# Patient Record
Sex: Female | Born: 1955 | Race: White | Hispanic: No | Marital: Married | Smoking: Never smoker
Health system: Southern US, Community
[De-identification: ages and names within clinical notes are randomized; demographics above are authoritative.]

## PROBLEM LIST (undated history)

## (undated) DIAGNOSIS — I10 Essential (primary) hypertension: Secondary | ICD-10-CM

## (undated) DIAGNOSIS — G4700 Insomnia, unspecified: Secondary | ICD-10-CM

## (undated) DIAGNOSIS — J342 Deviated nasal septum: Secondary | ICD-10-CM

## (undated) DIAGNOSIS — K635 Polyp of colon: Secondary | ICD-10-CM

## (undated) DIAGNOSIS — D509 Iron deficiency anemia, unspecified: Secondary | ICD-10-CM

## (undated) DIAGNOSIS — I4891 Unspecified atrial fibrillation: Secondary | ICD-10-CM

## (undated) DIAGNOSIS — E7800 Pure hypercholesterolemia, unspecified: Secondary | ICD-10-CM

## (undated) DIAGNOSIS — H269 Unspecified cataract: Secondary | ICD-10-CM

## (undated) DIAGNOSIS — M1990 Unspecified osteoarthritis, unspecified site: Secondary | ICD-10-CM

## (undated) DIAGNOSIS — J309 Allergic rhinitis, unspecified: Secondary | ICD-10-CM

## (undated) DIAGNOSIS — I480 Paroxysmal atrial fibrillation: Secondary | ICD-10-CM

## (undated) DIAGNOSIS — K254 Chronic or unspecified gastric ulcer with hemorrhage: Secondary | ICD-10-CM

## (undated) DIAGNOSIS — E538 Deficiency of other specified B group vitamins: Secondary | ICD-10-CM

## (undated) DIAGNOSIS — K5790 Diverticulosis of intestine, part unspecified, without perforation or abscess without bleeding: Secondary | ICD-10-CM

## (undated) DIAGNOSIS — E785 Hyperlipidemia, unspecified: Secondary | ICD-10-CM

## (undated) DIAGNOSIS — K219 Gastro-esophageal reflux disease without esophagitis: Secondary | ICD-10-CM

## (undated) HISTORY — DX: Unspecified cataract: H26.9

## (undated) HISTORY — DX: Pure hypercholesterolemia, unspecified: E78.00

## (undated) HISTORY — DX: Unspecified atrial fibrillation: I48.91

## (undated) HISTORY — DX: Paroxysmal atrial fibrillation: I48.0

## (undated) HISTORY — PX: TONSILLECTOMY: SUR1361

## (undated) HISTORY — DX: Hypercalcemia: E83.52

## (undated) HISTORY — DX: Unspecified osteoarthritis, unspecified site: M19.90

## (undated) HISTORY — DX: Hyperlipidemia, unspecified: E78.5

## (undated) HISTORY — PX: NASAL SINUS SURGERY: SHX719

## (undated) HISTORY — DX: Iron deficiency anemia, unspecified: D50.9

## (undated) HISTORY — DX: Diverticulosis of intestine, part unspecified, without perforation or abscess without bleeding: K57.90

## (undated) HISTORY — DX: Essential (primary) hypertension: I10

## (undated) HISTORY — DX: Allergic rhinitis, unspecified: J30.9

## (undated) HISTORY — DX: Insomnia, unspecified: G47.00

## (undated) HISTORY — DX: Deficiency of other specified B group vitamins: E53.8

---

## 1999-05-16 ENCOUNTER — Encounter: Admission: RE | Admit: 1999-05-16 | Discharge: 1999-05-16 | Payer: Self-pay | Admitting: Family Medicine

## 1999-05-16 ENCOUNTER — Encounter: Payer: Self-pay | Admitting: Family Medicine

## 1999-11-18 ENCOUNTER — Other Ambulatory Visit: Admission: RE | Admit: 1999-11-18 | Discharge: 1999-11-18 | Payer: Self-pay | Admitting: Obstetrics and Gynecology

## 1999-11-26 ENCOUNTER — Encounter: Admission: RE | Admit: 1999-11-26 | Discharge: 1999-11-26 | Payer: Self-pay | Admitting: *Deleted

## 1999-11-26 ENCOUNTER — Encounter: Payer: Self-pay | Admitting: *Deleted

## 2001-05-26 ENCOUNTER — Other Ambulatory Visit: Admission: RE | Admit: 2001-05-26 | Discharge: 2001-05-26 | Payer: Self-pay | Admitting: Obstetrics & Gynecology

## 2001-06-16 ENCOUNTER — Encounter: Payer: Self-pay | Admitting: Obstetrics & Gynecology

## 2001-06-16 ENCOUNTER — Encounter: Admission: RE | Admit: 2001-06-16 | Discharge: 2001-06-16 | Payer: Self-pay | Admitting: Obstetrics & Gynecology

## 2003-03-21 ENCOUNTER — Other Ambulatory Visit: Admission: RE | Admit: 2003-03-21 | Discharge: 2003-03-21 | Payer: Self-pay | Admitting: Obstetrics and Gynecology

## 2003-04-06 ENCOUNTER — Encounter: Admission: RE | Admit: 2003-04-06 | Discharge: 2003-04-06 | Payer: Self-pay | Admitting: Obstetrics & Gynecology

## 2003-04-06 ENCOUNTER — Encounter: Payer: Self-pay | Admitting: Obstetrics & Gynecology

## 2004-06-18 ENCOUNTER — Encounter: Admission: RE | Admit: 2004-06-18 | Discharge: 2004-06-18 | Payer: Self-pay | Admitting: Obstetrics & Gynecology

## 2005-11-06 ENCOUNTER — Encounter: Admission: RE | Admit: 2005-11-06 | Discharge: 2005-11-06 | Payer: Self-pay | Admitting: Obstetrics & Gynecology

## 2006-07-21 DIAGNOSIS — K635 Polyp of colon: Secondary | ICD-10-CM

## 2006-07-21 DIAGNOSIS — K5790 Diverticulosis of intestine, part unspecified, without perforation or abscess without bleeding: Secondary | ICD-10-CM

## 2006-07-21 HISTORY — DX: Diverticulosis of intestine, part unspecified, without perforation or abscess without bleeding: K57.90

## 2006-07-21 HISTORY — DX: Polyp of colon: K63.5

## 2006-11-10 ENCOUNTER — Encounter: Admission: RE | Admit: 2006-11-10 | Discharge: 2006-11-10 | Payer: Self-pay | Admitting: Obstetrics & Gynecology

## 2007-01-20 ENCOUNTER — Encounter (INDEPENDENT_AMBULATORY_CARE_PROVIDER_SITE_OTHER): Payer: Self-pay | Admitting: Gastroenterology

## 2007-01-20 ENCOUNTER — Ambulatory Visit (HOSPITAL_COMMUNITY): Admission: RE | Admit: 2007-01-20 | Discharge: 2007-01-20 | Payer: Self-pay | Admitting: Gastroenterology

## 2008-01-04 ENCOUNTER — Encounter: Admission: RE | Admit: 2008-01-04 | Discharge: 2008-01-04 | Payer: Self-pay | Admitting: Obstetrics & Gynecology

## 2009-03-15 ENCOUNTER — Encounter: Admission: RE | Admit: 2009-03-15 | Discharge: 2009-03-15 | Payer: Self-pay | Admitting: Obstetrics and Gynecology

## 2010-09-20 ENCOUNTER — Other Ambulatory Visit (HOSPITAL_COMMUNITY): Payer: Self-pay | Admitting: Obstetrics and Gynecology

## 2010-09-20 DIAGNOSIS — Z1231 Encounter for screening mammogram for malignant neoplasm of breast: Secondary | ICD-10-CM

## 2010-09-27 ENCOUNTER — Ambulatory Visit (HOSPITAL_COMMUNITY)
Admission: RE | Admit: 2010-09-27 | Discharge: 2010-09-27 | Disposition: A | Discharge status: Home / Self Care | Payer: Self-pay | Source: Ambulatory Visit | Attending: Obstetrics and Gynecology | Admitting: Obstetrics and Gynecology

## 2010-09-27 DIAGNOSIS — Z1231 Encounter for screening mammogram for malignant neoplasm of breast: Secondary | ICD-10-CM | POA: Insufficient documentation

## 2010-12-03 NOTE — Op Note (Signed)
Jenna [name], Jenna [name]              ACCOUNT NO.:  [id number]   MEDICAL RECORD NO.:  [id number]          PATIENT TYPE:  AMB   LOCATION:  ENDO                         FACILITY:  [facility]   PHYSICIAN:  Anselmo Rod, M.D.  DATE OF BIRTH:  1955-09-29   DATE OF PROCEDURE:  01/20/2007  DATE OF DISCHARGE:                               OPERATIVE REPORT   PROCEDURE PERFORMED:  Colonoscopy with cold biopsies x2.   ENDOSCOPIST:  Anselmo Rod.   INSTRUMENT USED:  Pentax video colonoscope.   INDICATIONS FOR PROCEDURE:  A 55 year old white female undergoing  screening colonoscopy.  The patient denies a family history of colon  cancer.  Father had colonic polyps removed.   PREPROCEDURE PREPARATION:  Informed consent was procured from the  patient.  The patient fasted for 8 hours prior to the procedure and  prepped with 32 OsmoPrep pills the night of the procedure.  Risks and  benefits of the procedure including a 10% miss rate of cancer and polyps  were discussed with the patient as well.   PREPROCEDURE PHYSICAL:  Patient with stable vital signs.  NECK:  Supple.  CHEST:  Clear to auscultation.  S1/S1 regular.  ABDOMEN:  Soft with normal bowel sounds.   DESCRIPTION OF PROCEDURE:  The patient was placed in left lateral  decubitus position and sedated with 100 mcg of Fentanyl and 10 mg of  Versed given intravenously in slow incremental doses.  Once the patient  was adequately sedated and maintained on low-flow oxygen and continuous  cardiac monitoring, the Pentax video colonoscope was advanced from the  rectum to the cecum.  Multiple washes were done.  There was some  residual stool in the colon.  The appendiceal orifice and ileocecal  valve were clearly visualized and photographed.  Two small sessile  polyps were biopsied from the distal right colon, and small internal  hemorrhoids were seen on retroflexion.  The terminal ileum appeared  normal and without lesions.  There was significant  amount of residual  stool in the right colon.  Multiple washes were done.   IMPRESSION:  1. Two small sessile polyps biopsied from the distal right colon.  2. Pandiverticulosis with more prominent changes in the left colon.  3. Small internal hemorrhoids.  4. Some inspissated stool in several of the diverticula.  5. Normal terminal ileum.   RECOMMENDATIONS:  1. Await pathology results.  2. Avoid all nonsteroidals including aspirin for the next 2 weeks.  3. Repeat colonoscopy depending on pathology results.  4. High-fiber diet with liberal fluid intake has been advocated and      brochures have been given to the patient for education.  5. Outpatient follow-up as need arises in the future.      Anselmo Rod, M.D.  Electronically Signed     JNM/MEDQ  D:  01/20/2007  T:  01/21/2007  Job:  [id number]   cc:   Genia Del, M.D.  Fax: [phone]   Anna Genre Little, M.D.  Fax: [phone]

## 2012-01-09 ENCOUNTER — Encounter (HOSPITAL_BASED_OUTPATIENT_CLINIC_OR_DEPARTMENT_OTHER): Payer: Self-pay | Admitting: *Deleted

## 2012-01-09 ENCOUNTER — Emergency Department (HOSPITAL_BASED_OUTPATIENT_CLINIC_OR_DEPARTMENT_OTHER)
Admission: EM | Admit: 2012-01-09 | Discharge: 2012-01-09 | Disposition: A | Discharge status: Home / Self Care | Payer: No Typology Code available for payment source | Attending: Emergency Medicine | Admitting: Emergency Medicine

## 2012-01-09 DIAGNOSIS — M79609 Pain in unspecified limb: Secondary | ICD-10-CM | POA: Insufficient documentation

## 2012-01-09 DIAGNOSIS — X58XXXA Exposure to other specified factors, initial encounter: Secondary | ICD-10-CM | POA: Insufficient documentation

## 2012-01-09 DIAGNOSIS — M79673 Pain in unspecified foot: Secondary | ICD-10-CM

## 2012-01-09 DIAGNOSIS — S0003XA Contusion of scalp, initial encounter: Secondary | ICD-10-CM | POA: Insufficient documentation

## 2012-01-09 DIAGNOSIS — S1093XA Contusion of unspecified part of neck, initial encounter: Secondary | ICD-10-CM | POA: Insufficient documentation

## 2012-01-09 DIAGNOSIS — S0083XA Contusion of other part of head, initial encounter: Secondary | ICD-10-CM

## 2012-01-09 NOTE — Discharge Instructions (Signed)
Please read and follow all provided instructions.  Your diagnoses today include:  1. Facial bruising   2. Foot pain     Tests performed today include:  Vital signs. See below for your results today.   Medications prescribed:   None  Home care instructions:  Follow any educational materials contained in this packet.  The discoloration around your eye does not appear to be dangerous at this time. Follow-up with the eye specialist provided if you do not notice improvement in several days or if you wish to have further evaluation.   Follow-up instructions: Please follow-up with your primary care provider in the next 3 days for further evaluation of your symptoms. If you do not have a primary care doctor -- see below for referral information.   Return instructions:   Please return to the Emergency Department if you experience worsening symptoms.   Please return if you have any other emergent concerns.  Additional Information:  Your vital signs today were: BP 139/81  Pulse 79  Temp 97.4 F (36.3 C) (Oral)  Resp 16  SpO2 100% If your blood pressure (BP) was elevated above 135/85 this visit, please have this repeated by your doctor within one month. -------------- No Primary Care Doctor Call [facility]  [phone] Other agencies that provide inexpensive medical care    [facility]  [phone]    [facility]  [phone]    [facility]  [phone]    [facility]  [phone]    [facility]  [phone]    [facility]  [phone] -------------- RESOURCE GUIDE:  Dental Problems  Patients with Medicaid: [facility] [phone] [address]                                            [phone] [address] Phone:  [phone]                                                   Phone:  [phone]  If unable to pay or uninsured, contact:  [facility] or [facility] to  become qualified for the adult dental clinic.  Chronic Pain Problems Contact [facility]  [phone] Patients need to be referred by their primary care doctor.  Insufficient Money for Medicine Contact [employer]:  call "211" or [facility] [phone].  Psychological Services [facility]  [phone] [facility]  [phone] [facility]   [phone] (emergency services [phone])  Substance Abuse Resources [facility]  [phone] [facility] [phone] [facility] [place] [phone] Floydene Flock [phone] Residential & Outpatient Substance Abuse Program  [phone]  Abuse/Neglect [facility] Child Abuse Hotline 410-547-0647 [facility] Child Abuse Hotline [phone] (After Hours)  Emergency Shelter [facility] [phone]  Maternity Homes Room at the [place] [facility] [place] [phone] [place] [facility] [phone]  [facility]  [facility] [address] [place]                       [address]      [address]  [place]                                                [place]                            [place] Phone:  [phone]                                   Phone:  [phone]                 Phone:  [phone]  [place] [facility] Phone:  [phone]  [place] Child Abuse Hotline [phone] [phone] [phone] (After Hours)    

## 2012-01-09 NOTE — ED Provider Notes (Signed)
History     CSN: [id number]  Arrival date & time 01/09/12  1919   First MD Initiated Contact with Patient 01/09/12 2018      Chief Complaint  Patient presents with  . Eye Problem    (Consider location/radiation/quality/duration/timing/severity/associated sxs/prior treatment) HPI Comments: Patient presents with complaint of purple skin discoloration noted to left upper eyelid. This was first noticed this evening. Patient denies injury to the area. She denies injury to her forehead. She denies pain with movement of the eye, blurry vision, pain. She denies any vomiting or coughing/sneezing. She does not know what caused her symptoms. No headaches. No fevers. She has not treated the problem in any treatments prior to arrival. No problems with opening and closing her eyes. Nothing makes symptoms better or worse. Onset was acute. Course is constant. Patient denies use of blood thinning medications. She denies bleeding gums, blood in her stool, blood in urine, easy bruising.  Patient also complains of right foot pain that began one month ago when she hit it on a bed frame. She initially had bruising which resolved. She continues to have some mild tenderness. No trouble with ambulation. No treatments.  Patient is a 56 y.o. female presenting with eye problem. The history is provided by the patient.  Eye Problem  This is a new problem. The current episode started 1 to 2 hours ago. The problem occurs constantly. The problem has not changed since onset.There is pain in the left eye. There was no injury mechanism. The patient is experiencing no pain. There is no history of trauma to the eye. There is no known exposure to pink eye. She wears contacts. Pertinent negatives include no blurred vision, no discharge, no double vision, no foreign body sensation, no photophobia and no eye redness. She has tried nothing for the symptoms.    History reviewed. No pertinent past medical history.  Past Surgical  History  Procedure Date  . Tonsillectomy   . Cesarean section     No family history on file.  History  Substance Use Topics  . Smoking status: Never Smoker   . Smokeless tobacco: Not on file  . Alcohol Use: Yes    OB History    Grav Para Term Preterm Abortions TAB SAB Ect Mult Living                  Review of Systems  Constitutional: Negative for fever.  HENT: Negative for sore throat and rhinorrhea.   Eyes: Negative for blurred vision, double vision, photophobia, pain, discharge, redness, itching and visual disturbance.  Musculoskeletal: Positive for arthralgias. Negative for gait problem.  Skin: Positive for color change.  Neurological: Negative for headaches.  Hematological: Does not bruise/bleed easily.    Allergies  Bactrim  Home Medications   Current Outpatient Rx  Name Route Sig Dispense Refill  . ASPIRIN-ACETAMINOPHEN-CAFFEINE 250-250-65 MG PO TABS Oral Take 2 tablets by mouth every 6 (six) hours as needed. Patient used this medication for her headache.    Marland Kitchen FEXOFENADINE-PSEUDOEPHED ER 60-120 MG PO TB12 Oral Take 1 tablet by mouth 2 (two) times daily. Patient is using this medication for allergies.    Marland Kitchen KETOTIFEN FUMARATE 0.025 % OP SOLN Both Eyes Place 1 drop into both eyes 2 (two) times daily. Patient used this medication for dry eyes.    Marland Kitchen OXYMETAZOLINE HCL 0.05 % NA SOLN Nasal Place 1 spray into the nose 2 (two) times daily. Patient used this for allergies and congestion.  BP 139/81  Pulse 79  Temp 97.4 F (36.3 C) (Oral)  Resp 16  SpO2 100%  Physical Exam  Nursing note and vitals reviewed. Constitutional: She appears well-developed and well-nourished.  HENT:  Head: Normocephalic and atraumatic.    Eyes: Pupils are equal, round, and reactive to light. Right eye exhibits no discharge, no exudate and no hordeolum. No foreign body present in the right eye. Left eye exhibits no discharge, no exudate and no hordeolum. No foreign body present in  the left eye. Right conjunctiva is not injected. Right conjunctiva has no hemorrhage. Left conjunctiva is not injected. Left conjunctiva has no hemorrhage. Right eye exhibits normal extraocular motion and no nystagmus. Left eye exhibits normal extraocular motion and no nystagmus.  Neck: Normal range of motion. Neck supple.  Pulmonary/Chest: No respiratory distress.  Musculoskeletal:       Feet:  Lymphadenopathy:    She has no cervical adenopathy.  Neurological: She is alert.  Skin: Skin is warm and dry.  Psychiatric: She has a normal mood and affect.    ED Course  Procedures (including critical care time)  Labs Reviewed - No data to display No results found.   1. Facial bruising   2. Foot pain     9:09 PM Patient seen and examined. Patient was counseled and reassured. Opthalmology referral given if no improvement or worsening. She will treat foot pain conservatively.   Vital signs reviewed and are as follows: Filed Vitals:   01/09/12 1931  BP: 139/81  Pulse: 79  Temp: 97.4 F (36.3 C)  Resp: 16      MDM  Small area consistent with bruising of unknown etiology on the left eyelid. Eyes are normal. Patient to monitor. No evidence of bleeding disorder.  Foot pain, sequelae of recent injury. Do not suspect fracture. Patient is ambulatory without difficulty. Suspect bony contusion. This should heal over the next several months.        Renne Crigler, [place] 01/09/12 2226

## 2012-01-09 NOTE — ED Notes (Signed)
Went to the pool today. When she got home she had a bruise over her left eye. Bruise to her left eyelid noted. No known injury. Denies pain or visual disturbances.

## 2012-01-09 NOTE — ED Notes (Addendum)
Left eye lid slightly swollen,aching,slightlly purple in left inner corner

## 2012-01-10 NOTE — ED Provider Notes (Signed)
Medical screening examination/treatment/procedure(s) were performed by non-physician practitioner and as supervising physician I was immediately available for consultation/collaboration.  Hurman Horn, MD 01/10/12 [phone]

## 2012-02-26 ENCOUNTER — Inpatient Hospital Stay (HOSPITAL_COMMUNITY)
Admission: EM | Admit: 2012-02-26 | Discharge: 2012-02-27 | DRG: 812 | Disposition: A | Discharge status: Home / Self Care | Payer: No Typology Code available for payment source | Attending: Family Medicine | Admitting: Family Medicine

## 2012-02-26 ENCOUNTER — Encounter (HOSPITAL_COMMUNITY): Payer: Self-pay | Admitting: Vascular Surgery

## 2012-02-26 DIAGNOSIS — E7800 Pure hypercholesterolemia, unspecified: Secondary | ICD-10-CM | POA: Diagnosis present

## 2012-02-26 DIAGNOSIS — R002 Palpitations: Secondary | ICD-10-CM | POA: Diagnosis present

## 2012-02-26 DIAGNOSIS — Z79899 Other long term (current) drug therapy: Secondary | ICD-10-CM

## 2012-02-26 DIAGNOSIS — E785 Hyperlipidemia, unspecified: Secondary | ICD-10-CM | POA: Insufficient documentation

## 2012-02-26 DIAGNOSIS — D649 Anemia, unspecified: Secondary | ICD-10-CM

## 2012-02-26 DIAGNOSIS — D509 Iron deficiency anemia, unspecified: Principal | ICD-10-CM | POA: Diagnosis present

## 2012-02-26 DIAGNOSIS — Z7982 Long term (current) use of aspirin: Secondary | ICD-10-CM

## 2012-02-26 DIAGNOSIS — Z860100 Personal history of colon polyps, unspecified: Secondary | ICD-10-CM

## 2012-02-26 HISTORY — DX: Deviated nasal septum: J34.2

## 2012-02-26 HISTORY — DX: Polyp of colon: K63.5

## 2012-02-26 LAB — PROTIME-INR
INR: 0.99 (ref 0.00–1.49)
Prothrombin Time: 13.3 seconds (ref 11.6–15.2)

## 2012-02-26 LAB — CBC WITH DIFFERENTIAL/PLATELET
Basophils Absolute: 0 10*3/uL (ref 0.0–0.1)
Basophils Relative: 0 % (ref 0–1)
HCT: 22.7 % — ABNORMAL LOW (ref 36.0–46.0)
Hemoglobin: 6.5 g/dL — CL (ref 12.0–15.0)
Lymphocytes Relative: 30 % (ref 12–46)
Lymphs Abs: 2.2 10*3/uL (ref 0.7–4.0)
MCV: 71.4 fL — ABNORMAL LOW (ref 78.0–100.0)
Monocytes Relative: 6 % (ref 3–12)
Neutro Abs: 4.7 10*3/uL (ref 1.7–7.7)
RDW: 18.3 % — ABNORMAL HIGH (ref 11.5–15.5)
WBC: 7.4 10*3/uL (ref 4.0–10.5)

## 2012-02-26 LAB — COMPREHENSIVE METABOLIC PANEL
Albumin: 3.6 g/dL (ref 3.5–5.2)
Alkaline Phosphatase: 78 U/L (ref 39–117)
BUN: 9 mg/dL (ref 6–23)
CO2: 27 mEq/L (ref 19–32)
Chloride: 104 mEq/L (ref 96–112)
Creatinine, Ser: 0.79 mg/dL (ref 0.50–1.10)
GFR calc Af Amer: >90 mL/min (ref >90)
GFR calc non Af Amer: >90 mL/min (ref >90)
Glucose, Bld: 97 mg/dL (ref 70–99)
Potassium: 3.6 mEq/L (ref 3.5–5.1)
Total Bilirubin: 0.2 mg/dL — ABNORMAL LOW (ref 0.3–1.2)

## 2012-02-26 LAB — POCT I-STAT TROPONIN I: Troponin i, poc: 0.01 ng/mL (ref 0.00–0.08)

## 2012-02-26 LAB — OCCULT BLOOD, POC DEVICE: Fecal Occult Bld: NEGATIVE

## 2012-02-26 LAB — URINALYSIS, ROUTINE W REFLEX MICROSCOPIC
Bilirubin Urine: NEGATIVE
Glucose, UA: NEGATIVE mg/dL
Hgb urine dipstick: NEGATIVE
Protein, ur: NEGATIVE mg/dL
Urobilinogen, UA: 0.2 mg/dL (ref 0.0–1.0)

## 2012-02-26 MED ORDER — ACETAMINOPHEN 650 MG RE SUPP: 650.0000 mg | Freq: Four times a day (QID) | RECTAL | Status: DC | PRN

## 2012-02-26 MED ORDER — ASPIRIN EC 81 MG PO TBEC
81.0000 mg | DELAYED_RELEASE_TABLET | Freq: Every day | ORAL | Status: DC
  Filled 2012-02-26: qty 1

## 2012-02-26 MED ORDER — DOCUSATE SODIUM 100 MG PO CAPS
100.0000 mg | ORAL_CAPSULE | Freq: Every day | ORAL | Status: DC | PRN
  Filled 2012-02-26: qty 1

## 2012-02-26 MED ORDER — SODIUM CHLORIDE 0.9 % IV BOLUS (SEPSIS)
500.0000 mL | Freq: Once | INTRAVENOUS | Status: AC
  Administered 2012-02-26: 500 mL via INTRAVENOUS

## 2012-02-26 MED ORDER — LORATADINE 10 MG PO TABS
10.0000 mg | ORAL_TABLET | Freq: Every day | ORAL | Status: DC
  Administered 2012-02-27: 10 mg via ORAL
  Filled 2012-02-26: qty 1

## 2012-02-26 MED ORDER — ALUM & MAG HYDROXIDE-SIMETH 200-200-20 MG/5ML PO SUSP: 30.0000 mL | Freq: Four times a day (QID) | ORAL | Status: DC | PRN

## 2012-02-26 MED ORDER — ZOLPIDEM TARTRATE 5 MG PO TABS: 5.0000 mg | ORAL_TABLET | Freq: Every evening | ORAL | Status: DC | PRN

## 2012-02-26 MED ORDER — DIPHENHYDRAMINE HCL 25 MG PO CAPS
25.0000 mg | ORAL_CAPSULE | Freq: Once | ORAL | Status: AC
  Administered 2012-02-27: 25 mg via ORAL
  Filled 2012-02-26: qty 1

## 2012-02-26 MED ORDER — FLUTICASONE PROPIONATE 50 MCG/ACT NA SUSP
2.0000 | Freq: Every day | NASAL | Status: DC
  Administered 2012-02-27: 2 via NASAL
  Filled 2012-02-26: qty 16

## 2012-02-26 MED ORDER — SIMVASTATIN 20 MG PO TABS
20.0000 mg | ORAL_TABLET | Freq: Every day | ORAL | Status: DC
  Filled 2012-02-26: qty 1

## 2012-02-26 MED ORDER — ACETAMINOPHEN 325 MG PO TABS
650.0000 mg | ORAL_TABLET | Freq: Once | ORAL | Status: AC
  Administered 2012-02-27: 650 mg via ORAL
  Filled 2012-02-26: qty 2

## 2012-02-26 MED ORDER — ACETAMINOPHEN 325 MG PO TABS: 650.0000 mg | ORAL_TABLET | Freq: Four times a day (QID) | ORAL | Status: DC | PRN

## 2012-02-26 NOTE — ED Notes (Signed)
Pt reports she has been having episodes of palpitations and for about 2 hours afterwards she states she cannot stand up because she feels lightheaded and weak. ECG at the doctors office today was unremarkable but referred her to a cardiologist. At this visit blood was drawn and revealed a hemoglobin of 7.1. Denies SOB or more fatigue than usual. Also reports she came to the ED in June with a black eye for no reason.

## 2012-02-26 NOTE — ED Provider Notes (Signed)
History     CSN: [id number]  Arrival date & time 02/26/12  1816   First MD Initiated Contact with Patient 02/26/12 1905      Chief Complaint  Patient presents with  . Abnormal Lab    (Consider location/radiation/quality/duration/timing/severity/associated sxs/prior treatment) HPI Pt has had several weeks of dizziness when standing worsening the past few days. She also states she has been having palpitations in the morning which are now resolved. No SOB or CP. No leg swelling. Notes she has scattered bruising. She denies blood in stool, urine or vaginal bleeding. Seen by PMD and HGB resulted as 7.1. No history of anemia Past Medical History  Diagnosis Date  . Deviated nasal septum   . Colon polyp     Past Surgical History  Procedure Date  . Tonsillectomy   . Cesarean section   . Nasal sinus surgery     Family History  Problem Relation Age of Onset  . Hypertension Mother     History  Substance Use Topics  . Smoking status: Never Smoker   . Smokeless tobacco: Not on file  . Alcohol Use: Yes     2-3 glasses of wine a week    OB History    Grav Para Term Preterm Abortions TAB SAB Ect Mult Living   3 2 2       2       Review of Systems  Constitutional: Negative for fever and chills.  HENT: Negative for neck pain.   Eyes: Negative for visual disturbance.  Respiratory: Negative for cough, shortness of breath, wheezing and stridor.   Cardiovascular: Positive for palpitations. Negative for chest pain and leg swelling.  Gastrointestinal: Negative for nausea, vomiting, abdominal pain, diarrhea and blood in stool.  Genitourinary: Negative for hematuria and vaginal bleeding.  Musculoskeletal: Negative for myalgias, back pain and arthralgias.  Skin: Negative for rash and wound.  Neurological: Positive for dizziness and light-headedness. Negative for syncope, weakness, numbness and headaches.  Hematological: Bruises/bleeds easily.    Allergies  Bactrim  Home  Medications   Current Outpatient Rx  Name Route Sig Dispense Refill  . ASPIRIN EC 81 MG PO TBEC Oral Take 81 mg by mouth daily.    . ASPIRIN-ACETAMINOPHEN-CAFFEINE 250-250-65 MG PO TABS Oral Take 2 tablets by mouth every 6 (six) hours as needed. Patient used this medication for her headache.    Marland Kitchen FEXOFENADINE-PSEUDOEPHED ER 60-120 MG PO TB12 Oral Take 1 tablet by mouth 2 (two) times daily. Patient is using this medication for allergies.    Marland Kitchen FLUTICASONE PROPIONATE 50 MCG/ACT NA SUSP Nasal Place 2 sprays into the nose daily.    Marland Kitchen PRAVASTATIN SODIUM 40 MG PO TABS Oral Take 40 mg by mouth daily.      BP 122/79  Pulse 85  Temp 98.6 F (37 C) (Oral)  Resp 20  Ht 5\' 4"  (1.626 m)  Wt 165 lb (74.844 kg)  BMI 28.32 kg/m2  SpO2 96%  Physical Exam  Nursing note and vitals reviewed. Constitutional: She is oriented to person, place, and time. She appears well-developed and well-nourished. No distress.  HENT:  Head: Normocephalic and atraumatic.  Mouth/Throat: Oropharynx is clear and moist.  Eyes: EOM are normal. Pupils are equal, round, and reactive to light.  Neck: Normal range of motion. Neck supple.  Cardiovascular: Normal rate and regular rhythm.   Pulmonary/Chest: Effort normal and breath sounds normal. No respiratory distress. She has no wheezes. She has no rales. She exhibits no tenderness.  Abdominal: Soft.  Bowel sounds are normal. She exhibits no distension and no mass. There is no tenderness. There is no rebound and no guarding.  Musculoskeletal: Normal range of motion. She exhibits no edema and no tenderness.  Neurological: She is alert and oriented to person, place, and time.       5/5 motor, sensation intact.   Skin: Skin is warm and dry. No rash noted. No erythema.       Scattered bruises  Psychiatric: She has a normal mood and affect. Her behavior is normal.    ED Course  Procedures (including critical care time)  Labs Reviewed  CBC WITH DIFFERENTIAL - Abnormal; Notable  for the following:    RBC 3.18 (*)     Hemoglobin 6.5 (*)     HCT 22.7 (*)     MCV 71.4 (*)     MCH 20.4 (*)     MCHC 28.6 (*)     RDW 18.3 (*)     All other components within normal limits  COMPREHENSIVE METABOLIC PANEL - Abnormal; Notable for the following:    Total Bilirubin 0.2 (*)     All other components within normal limits  PROTIME-INR  APTT  URINALYSIS, ROUTINE W REFLEX MICROSCOPIC  POCT I-STAT TROPONIN I  PREPARE RBC (CROSSMATCH)  OCCULT BLOOD, POC DEVICE  TYPE AND SCREEN  ABO/RH  OCCULT BLOOD X 1 CARD TO LAB, STOOL   No results found.   1. Anemia      Date: 02/26/2012  Rate: 79  Rhythm: normal sinus rhythm  QRS Axis: normal  Intervals: normal  ST/T Wave abnormalities: normal  Conduction Disutrbances:none  Narrative Interpretation:   Old EKG Reviewed: none available    MDM          Loren Racer, MD 02/26/12 2227

## 2012-02-26 NOTE — H&P (Signed)
PCP:   No primary provider on file.   Chief Complaint:  Anemia  HPI: This is a 56 year old Caucasian female who has hypercholesterolemia who presents to emergency department with anemia. The patient has a 2 week history of dizziness, lightheadedness, intermittent palpitations and tachycardia. She was evaluated at her family doctor's office and had blood test done. She was called today with the results that showed severe anemia. She was instructed to present to the emergency department. She denies vaginal bleeding, bright red blood per rectum, blood in the stool, melena, chronic abdominal pain, hematemesis. She denies a history of anemia.  Review of Systems:  Attempt system review of systems was performed which was as in the history of present illness and otherwise negative.  Past Medical History: Past Medical History  Diagnosis Date  . Deviated nasal septum   . Colon polyp    Past Surgical History  Procedure Date  . Tonsillectomy   . Cesarean section   . Nasal sinus surgery     Medications: Prior to Admission medications   Medication Sig Start Date End Date Taking? Authorizing Provider  aspirin EC 81 MG tablet Take 81 mg by mouth daily.   Yes Historical Provider, MD  aspirin-acetaminophen-caffeine (EXCEDRIN MIGRAINE) [phone] MG per tablet Take 2 tablets by mouth every 6 (six) hours as needed. Patient used this medication for her headache.   Yes Historical Provider, MD  fexofenadine-pseudoephedrine (ALLEGRA-D) 60-120 MG per tablet Take 1 tablet by mouth 2 (two) times daily. Patient is using this medication for allergies.   Yes Historical Provider, MD  fluticasone (FLONASE) 50 MCG/ACT nasal spray Place 2 sprays into the nose daily.   Yes Historical Provider, MD  pravastatin (PRAVACHOL) 40 MG tablet Take 40 mg by mouth daily.   Yes Historical Provider, MD    Allergies:   Allergies  Allergen Reactions  . Bactrim (Sulfamethoxazole W-Trimethoprim) Other (See Comments)    Canker  sores in her mouth.    Social History:  reports that she has never smoked. She does not have any smokeless tobacco history on file. She reports that she drinks alcohol. She reports that she does not use illicit drugs.  Family History: Family History  Problem Relation Age of Onset  . Hypertension Mother     Physical Exam: Filed Vitals:   02/26/12 1840 02/26/12 2245  BP: 122/79 131/83  Pulse: 85 93  Temp: 98.6 F (37 C) 98.2 F (36.8 C)  TempSrc: Oral Oral  Resp: 20 19  Height: 5\' 4"  (1.626 m)   Weight: 74.844 kg (165 lb)   SpO2: 96%    General appearance: alert, cooperative and no distress Head: Normocephalic, without obvious abnormality, atraumatic Eyes: Conjunctiva pale. Ocular muscles are intact. Right sclera with small broken blood vessel. Pupils round reactive to light Ears: normal TM's and external ear canals both ears Throat: lips, mucosa, and tongue normal; teeth and gums normal Neck: no adenopathy, no carotid bruit, no JVD, supple, symmetrical, trachea midline and thyroid not enlarged, symmetric, no tenderness/mass/nodules Resp: clear to auscultation bilaterally Cardio: regular rate and rhythm, S1, S2 normal, no murmur, click, rub or gallop GI: soft, non-tender; bowel sounds normal; no masses,  no organomegaly Extremities: extremities normal, atraumatic, no cyanosis or edema Pulses: 2+ and symmetric Skin: Skin color, texture, turgor normal. No rashes or lesions Neurologic: Alert and oriented X 3, normal strength and tone. Normal symmetric reflexes. Normal coordination and gait   Labs on Admission:   Basename 02/26/12 2014  NA 139  K  3.6  CL 104  CO2 27  GLUCOSE 97  BUN 9  CREATININE 0.79  CALCIUM 9.4  MG --  PHOS --    Basename 02/26/12 2014  AST 14  ALT 7  ALKPHOS 78  BILITOT 0.2*  PROT 6.2  ALBUMIN 3.6   No results found for this basename: LIPASE:2,AMYLASE:2 in the last 72 hours  Basename 02/26/12 2014  WBC 7.4  NEUTROABS 4.7  HGB 6.5*    HCT 22.7*  MCV 71.4*  PLT 384   No results found for this basename: CKTOTAL:3,CKMB:3,CKMBINDEX:3,TROPONINI:3 in the last 72 hours No results found for this basename: TSH,T4TOTAL,FREET3,T3FREE,THYROIDAB in the last 72 hours  Basename 02/26/12 2014  VITAMINB12 --  FOLATE --  FERRITIN --  TIBC --  IRON --  RETICCTPCT 1.8    Radiological Exams on Admission: No results found.  Assessment/Plan Present on Admission:  .Microcytic anemia .Palpitations  #1 microcytic anemia. We'll admit the patient to medical floor. Will continue transfusion of 2 units packed blood cells - the patient was consented for this by me. We'll also obtain iron studies, particularly, B12 and folic acid levels from blood obtained prior to transfusion. Will also obtain Hemoccult stool samples even though negative in the ED  #2 palpitations. Secondary to #1. Will treat the patient to investigate causes of #1.  #3 DVT prophylaxis: SCDs  #4 Code Status: Full code.   STINSON, JACOB JEHIEL 02/26/2012, 11:04 PM

## 2012-02-26 NOTE — ED Notes (Signed)
Called blood bank regarding pt's RBC's. Blood blank stated "it is still processing. Should be done in five minutes."

## 2012-02-27 ENCOUNTER — Encounter (HOSPITAL_COMMUNITY): Payer: Self-pay | Admitting: *Deleted

## 2012-02-27 DIAGNOSIS — D509 Iron deficiency anemia, unspecified: Principal | ICD-10-CM

## 2012-02-27 DIAGNOSIS — R002 Palpitations: Secondary | ICD-10-CM

## 2012-02-27 DIAGNOSIS — E785 Hyperlipidemia, unspecified: Secondary | ICD-10-CM

## 2012-02-27 LAB — CBC
HCT: 31.4 % — ABNORMAL LOW (ref 36.0–46.0)
Hemoglobin: 8.8 g/dL — ABNORMAL LOW (ref 12.0–15.0)
Hemoglobin: 9.6 g/dL — ABNORMAL LOW (ref 12.0–15.0)
MCH: 22.9 pg — ABNORMAL LOW (ref 26.0–34.0)
MCHC: 30.6 g/dL (ref 30.0–36.0)
MCV: 74.5 fL — ABNORMAL LOW (ref 78.0–100.0)
MCV: 75.1 fL — ABNORMAL LOW (ref 78.0–100.0)
RBC: 3.85 MIL/uL — ABNORMAL LOW (ref 3.87–5.11)
RDW: 19.3 % — ABNORMAL HIGH (ref 11.5–15.5)

## 2012-02-27 LAB — FERRITIN: Ferritin: 1 ng/mL — ABNORMAL LOW (ref 10–291)

## 2012-02-27 LAB — IRON AND TIBC
Iron: <10 ug/dL — ABNORMAL LOW (ref 42–135)
UIBC: 456 ug/dL — ABNORMAL HIGH (ref 125–400)

## 2012-02-27 MED ORDER — FERROUS SULFATE 325 (65 FE) MG PO TABS
325.0000 mg | ORAL_TABLET | Freq: Two times a day (BID) | ORAL | Status: DC
  Filled 2012-02-27 (×2): qty 1

## 2012-02-27 MED ORDER — FOLIC ACID 1 MG PO TABS: 1.0000 mg | ORAL_TABLET | Freq: Every day | ORAL | Status: AC

## 2012-02-27 MED ORDER — FERROUS SULFATE 325 (65 FE) MG PO TABS: 325.0000 mg | ORAL_TABLET | Freq: Two times a day (BID) | ORAL | Status: DC

## 2012-02-27 NOTE — Discharge Summary (Signed)
Physician Discharge Summary  Jenna [name] ZOX:[id number] DOB: 01/18/1956 DOA: 02/26/2012  PCP: No primary provider on file.  Admit date: 02/26/2012 Discharge date: 02/27/2012  Recommendations for Outpatient Follow-up:  1. Needs outpatient hematology followup-had leukocytosis on smear 2. Needs a CBC in 2-3 days-os transfusion hematocrit hemoglobin was 9.6   Discharge Diagnoses:  Symptomatic anemia  Active Problems:  Microcytic anemia  Palpitations   Discharge Condition: Good  Diet recommendation: Regular  Wt Readings from Last 3 Encounters:  02/26/12 75.5 kg (166 lb 7.2 oz)    History of present illness:  This is a 56 year old Caucasian female who has hypercholesterolemia who presents to emergency department with anemia. The patient has a 2 week history of dizziness, lightheadedness, intermittent palpitations and tachycardia. She was evaluated at her family doctor's office and had blood test done. She was called today with the results that showed severe anemia. She was instructed to present to the emergency department. She denies vaginal bleeding, bright red blood per rectum, blood in the stool, melena, chronic abdominal pain, hematemesis. She denies a history of anemia.  Patient noted to be taking aspirin as well as the Donnatal which contains aspirin which have stopped given the propensity for microscopic gastric erosions causing anemia  Patient was transfused 2 units packed red blood cells.  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 I have recommended her to take folic acid as well as iron 811 mg twice daily and followup with a hematologist I have recommended Dr. Darnelle Catalan see her.  She agrees with this plan  Procedures:  None  Consultations:  None  Discharge  Exam: Filed Vitals:   02/27/12 1515  BP: 141/85  Pulse: 81  Temp: 98.4 F (36.9 C)  Resp: 20   Filed Vitals:   02/27/12 0155 02/27/12 0225 02/27/12 0534 02/27/12 1515  BP: 120/61 117/71 129/77 141/85  Pulse: 88 88 86 81  Temp: 98.2 F (36.8 C) 98.7 F (37.1 C) 97.8 F (36.6 C) 98.4 F (36.9 C)  TempSrc: Oral  Oral Oral  Resp: 18 18 18 20   Height:      Weight:      SpO2: 100% 98% 97% 97%    Pleasant alert frail Asian female in no apparent distress Chest clear clear no added sound S1-S2 no murmur rub or gallop Abdomen soft nontender nondistended No peripheral edema Neuro motor intact, sensation intact, stand and has no dizziness  Discharge Instructions  Discharge Orders    Future Orders Please Complete By Expires   Diet - low sodium heart healthy      Increase activity slowly      Call MD for:  extreme fatigue      Call MD for:  difficulty breathing, headache or visual disturbances      Call MD for:  persistant nausea and vomiting      Call MD for:  temperature >100.4      Call MD for:  redness, tenderness, or signs of infection (pain, swelling, redness, odor or green/yellow discharge around incision site)        Medication List  As of 02/27/2012  4:50 PM   STOP taking these medications         aspirin-acetaminophen-caffeine 250-250-65 MG per tablet         TAKE  these medications         aspirin EC 81 MG tablet   Take 81 mg by mouth daily.      ferrous sulfate 325 (65 FE) MG tablet   Take 1 tablet (325 mg total) by mouth 2 (two) times daily with a meal.      fexofenadine-pseudoephedrine 60-120 MG per tablet   Commonly known as: ALLEGRA-D   Take 1 tablet by mouth 2 (two) times daily. Patient is using this medication for allergies.      fluticasone 50 MCG/ACT nasal spray   Commonly known as: FLONASE   Place 2 sprays into the nose daily.      folic acid 1 MG tablet   Commonly known as: FOLVITE   Take 1 tablet (1 mg total) by mouth daily.       pravastatin 40 MG tablet   Commonly known as: PRAVACHOL   Take 40 mg by mouth daily.           Follow-up Information    Schedule an appointment as soon as possible for a visit with Mickie Hillier, MD. (get Labs (blood count) in 2 days)    Contact information:   [address] [place] [address] [phone]       Follow up with Lowella Dell, MD. Schedule an appointment as soon as possible for a visit in 1 week.   Contact information:   [address] [place] [address] [phone]           The results of significant diagnostics from this hospitalization (including imaging, microbiology, ancillary and laboratory) are listed below for reference.    Significant Diagnostic Studies: No results found.  Microbiology: No results found for this or any previous visit (from the past 240 hour(s)).   Labs: Basic Metabolic Panel:  Lab 02/26/12 4403  NA 139  K 3.6  CL 104  CO2 27  GLUCOSE 97  BUN 9  CREATININE 0.79  CALCIUM 9.4  MG --  PHOS --   Liver Function Tests:  Lab 02/26/12 2014  AST 14  ALT 7  ALKPHOS 78  BILITOT 0.2*  PROT 6.2  ALBUMIN 3.6   No results found for this basename: LIPASE:5,AMYLASE:5 in the last 168 hours No results found for this basename: AMMONIA:5 in the last 168 hours CBC:  Lab 02/27/12 1445 02/27/12 0510 02/26/12 2014  WBC 7.0 6.2 7.4  NEUTROABS -- -- 4.7  HGB 9.6* 8.8* 6.5*  HCT 31.4* 28.7* 22.7*  MCV 75.1* 74.5* 71.4*  PLT 375 327 384   Cardiac Enzymes: No results found for this basename: CKTOTAL:5,CKMB:5,CKMBINDEX:5,TROPONINI:5 in the last 168 hours BNP: BNP (last 3 results) No results found for this basename: PROBNP:3 in the last 8760 hours CBG: No results found for this basename: GLUCAP:5 in the last 168 hours  Time coordinating discharge: 35 minutes minutes  Signed:  Rhetta Mura  [facility] 02/27/2012, 4:48 PM

## 2012-02-28 LAB — TYPE AND SCREEN
ABO/RH(D): B POS
Antibody Screen: NEGATIVE
Unit division: 0

## 2012-03-03 ENCOUNTER — Telehealth: Payer: Self-pay | Admitting: Oncology

## 2012-03-03 NOTE — Telephone Encounter (Signed)
Referred by Dr. Clarene Duke. Dx- Anemia. NP packet mailed out.

## 2012-03-05 ENCOUNTER — Telehealth: Payer: Self-pay | Admitting: Oncology

## 2012-03-05 NOTE — Telephone Encounter (Signed)
Delivered chart on 8/16 for a  8/22 appt. With Dr. Gaylyn Rong

## 2012-03-09 ENCOUNTER — Encounter: Payer: Self-pay | Admitting: Oncology

## 2012-03-09 DIAGNOSIS — E7800 Pure hypercholesterolemia, unspecified: Secondary | ICD-10-CM | POA: Insufficient documentation

## 2012-03-09 DIAGNOSIS — E538 Deficiency of other specified B group vitamins: Secondary | ICD-10-CM | POA: Insufficient documentation

## 2012-03-11 ENCOUNTER — Encounter: Payer: Self-pay | Admitting: Oncology

## 2012-03-11 ENCOUNTER — Ambulatory Visit (HOSPITAL_BASED_OUTPATIENT_CLINIC_OR_DEPARTMENT_OTHER): Payer: No Typology Code available for payment source | Admitting: Oncology

## 2012-03-11 ENCOUNTER — Telehealth: Payer: Self-pay | Admitting: Oncology

## 2012-03-11 ENCOUNTER — Ambulatory Visit: Payer: No Typology Code available for payment source

## 2012-03-11 ENCOUNTER — Other Ambulatory Visit (HOSPITAL_BASED_OUTPATIENT_CLINIC_OR_DEPARTMENT_OTHER): Payer: No Typology Code available for payment source

## 2012-03-11 VITALS — BP 120/80 | HR 90 | Temp 98.0°F | Resp 20 | Ht 64.0 in | Wt 165.1 lb

## 2012-03-11 DIAGNOSIS — E538 Deficiency of other specified B group vitamins: Secondary | ICD-10-CM

## 2012-03-11 DIAGNOSIS — D509 Iron deficiency anemia, unspecified: Secondary | ICD-10-CM

## 2012-03-11 DIAGNOSIS — E7800 Pure hypercholesterolemia, unspecified: Secondary | ICD-10-CM

## 2012-03-11 LAB — MORPHOLOGY: PLT EST: ADEQUATE

## 2012-03-11 LAB — CBC & DIFF AND RETIC
Basophils Absolute: 0 10*3/uL (ref 0.0–0.1)
EOS%: 2.4 % (ref 0.0–7.0)
HCT: 38.6 % (ref 34.8–46.6)
HGB: 11.9 g/dL (ref 11.6–15.9)
Immature Retic Fract: 14 % — ABNORMAL HIGH (ref 1.60–10.00)
MCH: 24.6 pg — ABNORMAL LOW (ref 25.1–34.0)
MCV: 79.8 fL (ref 79.5–101.0)
MONO%: 4.4 % (ref 0.0–14.0)
NEUT%: 68.6 % (ref 38.4–76.8)
lymph#: 1.4 10*3/uL (ref 0.9–3.3)

## 2012-03-11 MED ORDER — CYANOCOBALAMIN 1000 MCG/ML IJ SOLN
1000.0000 ug | Freq: Once | INTRAMUSCULAR | Status: AC
  Administered 2012-03-11: 1000 ug via INTRAMUSCULAR

## 2012-03-11 NOTE — Patient Instructions (Addendum)
1. Diagnosis: Iron deficiency anemia and Vit B12 deficiency.  2. Potential causes: GI bleeding?  Vs malabsorption. 3. Workup: GI evaluation for endoscopy and colonoscopy it has not been done so. 4. Treatment: oral iron has been working for you.  Your Hgb today is normal at 11.9.   Vitamin B12 injection once monthly.   5. Followup: Once a month CBC to ensure stability of your Hgb.  We will space out the lab check in the future if Hbg stable.  We will see you in about 8 months.

## 2012-03-11 NOTE — Telephone Encounter (Signed)
Gave pt appt calendar for 8 months and will see ML in April 2014 with labs

## 2012-03-11 NOTE — Progress Notes (Signed)
[facility]  [phone] Fax:([phone]     INITIAL HEMATOLOGY CONSULTATION    Referral MD:  Dr. Catha Gosselin, MD  Reason for Referral:  Microcytic anemia.     HPI:  [name] is a 56 year-old woman with no significant PMH.  She had a colonoscopy in 2008 with Dr. Loreta Ave.  I personally talked with Dr. Loreta Ave about her last colonoscopy.  She had a poor bowel prep.  However, there were several polyps and diverticulosis.  She is supposed to have a follow up colonoscopy this year which she has not scheduled for.  She was in [facility] until about 4 week ago when she started developing progressive SOB, DOE, palpitation, dizziness.  She presented to PCP with Hgb of 6.  She was admitted to [facility] on 02/26/2012 where she received 2 units of pRBC and was started on oral iron.  She was kindly referred to the [facility] for further evaluation of iron deficiency anemia and presence of elliptocytosis on peripheral blood smears.   [name] presented to the clinic today with her husband for the first time.  She reported that since discharge, she has been feeling well.  She denied  SOB, DOE, palpitation, dizziness.  She denied all visible source of bleeding.  She does have slightly worsened GERD symptoms than before.    Patient denies fever, anorexia, weight loss, fatigue, headache, visual changes, confusion, drenching night sweats, palpable lymph node swelling, mucositis, odynophagia, dysphagia, nausea vomiting, jaundice, chest pain, palpitation, shortness of breath, dyspnea on exertion, productive cough, gum bleeding, epistaxis, hematemesis, hemoptysis, abdominal pain, abdominal swelling, early satiety, melena, hematochezia, hematuria, skin rash, spontaneous bleeding, joint swelling, joint pain, heat or cold intolerance, bowel bladder incontinence, back pain, focal motor weakness, paresthesia, depression, suicidal or homicidal ideation, feeling hopelessness.    Past  Medical History  Diagnosis Date  . Deviated nasal septum   . Colon polyp 2008    on last colonoscopy with Dr. Loreta Ave in 2008  . Iron deficiency anemia   . Hypercholesteremia   . Osteoarthritis   . Allergic rhinitis   . Vitamin B12 deficiency   . Diverticulosis 2008    on last colonoscopy with Dr. Loreta Ave in 2008  :    Past Surgical History  Procedure Date  . Tonsillectomy   . Cesarean section   . Nasal sinus surgery   :   CURRENT MEDS: Current Outpatient Prescriptions  Medication Sig Dispense Refill  . ferrous sulfate 325 (65 FE) MG tablet Take 1 tablet (325 mg total) by mouth 2 (two) times daily with a meal.  60 tablet  0  . fexofenadine-pseudoephedrine (ALLEGRA-D) 60-120 MG per tablet Take 1 tablet by mouth daily. Patient is using this medication for allergies.      . fluticasone (FLONASE) 50 MCG/ACT nasal spray Place 2 sprays into the nose 2 (two) times daily.       . folic acid (FOLVITE) 1 MG tablet Take 1 tablet (1 mg total) by mouth daily.  30 tablet  0  . pravastatin (PRAVACHOL) 40 MG tablet Take 40 mg by mouth daily.       Current Facility-Administered Medications  Medication Dose Route Frequency Provider Last Rate Last Dose  . cyanocobalamin ((VITAMIN B-12)) injection 1,000 mcg  1,000 mcg Intramuscular Once Exie Parody, MD   1,000 mcg at 03/11/12 1213      Allergies  Allergen Reactions  . Bactrim (Sulfamethoxazole W-Trimethoprim) Other (See Comments)  Canker sores in her mouth.  :  Family History  Problem Relation Age of Onset  . Hypertension Mother   . Hepatitis Mother   . Heart attack Father   . Hydrocephalus Father   . Cancer Maternal Aunt 74    breast cancer  :  History   Social History  . Marital Status: Married    Spouse Name: N/A    Number of Children: 2  . Years of Education: N/A   Occupational History  .      manager at a mattress store   Social History Main Topics  . Smoking status: Never Smoker   . Smokeless tobacco: Not on file  .  Alcohol Use: Yes     2-3 glasses of wine a week  . Drug Use: No  . Sexually Active:    Other Topics Concern  . Not on file   Social History Narrative  . No narrative on file  :  REVIEW OF SYSTEM:  The rest of the 14-point review of sytem was negative.   Exam:  General:  well-nourished in no acute distress.  Eyes:  no scleral icterus.  ENT:  There were no oropharyngeal lesions.  Neck was without thyromegaly.  Lymphatics:  Negative cervical, supraclavicular or axillary adenopathy.  Respiratory: lungs were clear bilaterally without wheezing or crackles.  Cardiovascular:  Regular rate and rhythm, S1/S2, without murmur, rub or gallop.  There was no pedal edema.  GI:  abdomen was soft, flat, nontender, nondistended, without organomegaly.  Rectal exam showed no rectal/anal mass.  Stool Guaiac negative for blood.  Muscoloskeletal:  no spinal tenderness of palpation of vertebral spine.  Skin exam was without echymosis, petichae.  Neuro exam was nonfocal.  Patient was able to get on and off exam table without assistance.  Gait was normal.  Patient was alerted and oriented.  Attention was good.   Language was appropriate.  Mood was normal without depression.  Speech was not pressured.  Thought content was not tangential.    LABS:  Lab Results  Component Value Date   WBC 5.7 03/11/2012   HGB 11.9 03/11/2012   HCT 38.6 03/11/2012   PLT 316 03/11/2012   GLUCOSE 97 02/26/2012   ALT 7 02/26/2012   AST 14 02/26/2012   NA 139 02/26/2012   K 3.6 02/26/2012   CL 104 02/26/2012   CREATININE 0.79 02/26/2012   BUN 9 02/26/2012   CO2 27 02/26/2012   INR 0.99 02/26/2012    Blood smear review:   I personally reviewed the patient's peripheral blood smear today.  There was anisocytosis. There was occasional cigar shaped RBC's.  There was increased in central palor.  There was no peripheral blast.  There was no schistocytosis, spherocytosis, target cell, rouleaux formation, tear drop cell.  There was no giant platelets or  platelet clumps.     ASSESSMENT AND PLAN:   1. Diagnosis: Iron deficiency anemia and Vit B12 deficiency.  Elliptocytosis is a nonspecific finding in patients with severe iron deficiency anemia.  2. Potential causes: GI bleeding?  Vs malabsorption.   3. Workup: I personally discussed the case with Dr. Loreta Ave who graciously agreed to evaluate patient soon for colonoscopy +/- endoscopy as well given her GERD symptoms.  4. Treatment: oral iron has been working to improve Hgb back to 11.9 today.  I advised her to continue oral iron as she tolerates it well.  I also reccommended Vitamin B12 injection once monthly.   5. Followup: Once a  month CBC to ensure stability of Hgb.  We will space out the lab check in the future if Hbg stable.  Follow up in about 8 months.  6. Age-appropriate cancer screening:  Beside colonoscopy, I also advised her on mammogram and Papsmear.  She does not quite remember when she is due for these.  She will inquire with her PCP.    Thank you for this referral.    The length of time of the face-to-face encounter was 45 minutes. More than 50% of time was spent counseling and coordination of care.

## 2012-03-18 NOTE — Progress Notes (Signed)
Received office notes from Dr. Charna Elizabeth @ [facility]; forwarded to Dr. Gaylyn Rong.

## 2012-03-30 NOTE — Progress Notes (Signed)
Received Operative Procedure Report from Dr. Charna Elizabeth; forwarded to Dr. Gaylyn Rong.

## 2012-04-15 ENCOUNTER — Other Ambulatory Visit (HOSPITAL_BASED_OUTPATIENT_CLINIC_OR_DEPARTMENT_OTHER): Payer: No Typology Code available for payment source | Admitting: Lab

## 2012-04-15 ENCOUNTER — Ambulatory Visit (HOSPITAL_BASED_OUTPATIENT_CLINIC_OR_DEPARTMENT_OTHER): Payer: No Typology Code available for payment source

## 2012-04-15 VITALS — BP 117/84 | HR 76 | Temp 97.3°F

## 2012-04-15 DIAGNOSIS — R002 Palpitations: Secondary | ICD-10-CM

## 2012-04-15 DIAGNOSIS — E538 Deficiency of other specified B group vitamins: Secondary | ICD-10-CM

## 2012-04-15 DIAGNOSIS — E785 Hyperlipidemia, unspecified: Secondary | ICD-10-CM

## 2012-04-15 DIAGNOSIS — E7800 Pure hypercholesterolemia, unspecified: Secondary | ICD-10-CM

## 2012-04-15 DIAGNOSIS — D509 Iron deficiency anemia, unspecified: Secondary | ICD-10-CM

## 2012-04-15 LAB — CBC WITH DIFFERENTIAL/PLATELET
Basophils Absolute: 0 10*3/uL (ref 0.0–0.1)
EOS%: 2 % (ref 0.0–7.0)
Eosinophils Absolute: 0.1 10*3/uL (ref 0.0–0.5)
HCT: 40.7 % (ref 34.8–46.6)
HGB: 13.6 g/dL (ref 11.6–15.9)
MCH: 28.7 pg (ref 25.1–34.0)
NEUT%: 68.2 % (ref 38.4–76.8)
lymph#: 1.6 10*3/uL (ref 0.9–3.3)

## 2012-04-15 MED ORDER — CYANOCOBALAMIN 1000 MCG/ML IJ SOLN
1000.0000 ug | Freq: Once | INTRAMUSCULAR | Status: AC
  Administered 2012-04-15: 1000 ug via INTRAMUSCULAR

## 2012-05-03 ENCOUNTER — Telehealth: Payer: Self-pay | Admitting: Oncology

## 2012-05-03 NOTE — Telephone Encounter (Signed)
Pt lmonvm 10/11 asking that 10/24 lb/inj appt be cx'd. Pt stated she is has an appt w/her family doctor that week and will have ln/inj done there. appts cx'd and message to desk nurse.

## 2012-05-13 ENCOUNTER — Other Ambulatory Visit: Payer: Self-pay | Admitting: Lab

## 2012-05-13 ENCOUNTER — Ambulatory Visit: Payer: Self-pay

## 2012-06-17 ENCOUNTER — Ambulatory Visit: Payer: Self-pay

## 2012-06-18 ENCOUNTER — Telehealth: Payer: Self-pay | Admitting: *Deleted

## 2012-06-18 ENCOUNTER — Other Ambulatory Visit: Payer: Self-pay | Admitting: Lab

## 2012-06-18 ENCOUNTER — Ambulatory Visit: Payer: Self-pay

## 2012-06-18 NOTE — Telephone Encounter (Signed)
Called patient about missed appointment.  She stated that she had called when she got the reminder call and cancelled due to having to work on black Friday.  She will reschedule shen she gets her next schedule.

## 2012-07-15 ENCOUNTER — Ambulatory Visit: Payer: Self-pay

## 2012-07-15 ENCOUNTER — Other Ambulatory Visit: Payer: Self-pay | Admitting: Lab

## 2012-07-15 ENCOUNTER — Telehealth: Payer: Self-pay

## 2012-07-15 NOTE — Telephone Encounter (Signed)
Left message on unidentified vm for pt to call office back.  Pt FTKA for injection today.  Note given to desk RN for follow up.

## 2012-07-16 ENCOUNTER — Telehealth: Payer: Self-pay | Admitting: Oncology

## 2012-07-16 NOTE — Telephone Encounter (Signed)
per 12.26.13 pof cancel all Dec, Jan, Feb 2014 labs and inj

## 2012-08-12 ENCOUNTER — Other Ambulatory Visit: Payer: Self-pay | Admitting: Lab

## 2012-08-12 ENCOUNTER — Ambulatory Visit: Payer: Self-pay

## 2012-09-16 ENCOUNTER — Ambulatory Visit: Payer: Self-pay

## 2012-09-16 ENCOUNTER — Other Ambulatory Visit: Payer: Self-pay | Admitting: Lab

## 2012-10-14 ENCOUNTER — Ambulatory Visit: Payer: Self-pay

## 2012-10-14 ENCOUNTER — Other Ambulatory Visit: Payer: Self-pay | Admitting: Lab

## 2012-11-09 ENCOUNTER — Telehealth: Payer: Self-pay | Admitting: Oncology

## 2012-11-11 ENCOUNTER — Ambulatory Visit: Payer: Self-pay | Admitting: Oncology

## 2012-11-11 ENCOUNTER — Other Ambulatory Visit: Payer: Self-pay | Admitting: Lab

## 2012-12-07 ENCOUNTER — Telehealth: Payer: Self-pay | Admitting: Oncology

## 2012-12-09 ENCOUNTER — Ambulatory Visit: Payer: Self-pay | Admitting: Oncology

## 2012-12-10 ENCOUNTER — Emergency Department (HOSPITAL_COMMUNITY): Payer: BC Managed Care – PPO

## 2012-12-10 ENCOUNTER — Telehealth: Payer: Self-pay | Admitting: *Deleted

## 2012-12-10 ENCOUNTER — Other Ambulatory Visit: Payer: Self-pay | Admitting: Oncology

## 2012-12-10 ENCOUNTER — Encounter (HOSPITAL_COMMUNITY): Payer: Self-pay | Admitting: Emergency Medicine

## 2012-12-10 ENCOUNTER — Emergency Department (HOSPITAL_COMMUNITY)
Admission: EM | Admit: 2012-12-10 | Discharge: 2012-12-10 | Disposition: A | Discharge status: Home / Self Care | Payer: BC Managed Care – PPO | Attending: Emergency Medicine | Admitting: Emergency Medicine

## 2012-12-10 ENCOUNTER — Ambulatory Visit: Payer: Self-pay | Admitting: Oncology

## 2012-12-10 DIAGNOSIS — E7800 Pure hypercholesterolemia, unspecified: Secondary | ICD-10-CM | POA: Insufficient documentation

## 2012-12-10 DIAGNOSIS — Z8719 Personal history of other diseases of the digestive system: Secondary | ICD-10-CM | POA: Insufficient documentation

## 2012-12-10 DIAGNOSIS — Z860100 Personal history of colon polyps, unspecified: Secondary | ICD-10-CM | POA: Insufficient documentation

## 2012-12-10 DIAGNOSIS — S8012XA Contusion of left lower leg, initial encounter: Secondary | ICD-10-CM

## 2012-12-10 DIAGNOSIS — W1809XA Striking against other object with subsequent fall, initial encounter: Secondary | ICD-10-CM | POA: Insufficient documentation

## 2012-12-10 DIAGNOSIS — Y9301 Activity, walking, marching and hiking: Secondary | ICD-10-CM | POA: Insufficient documentation

## 2012-12-10 DIAGNOSIS — W19XXXA Unspecified fall, initial encounter: Secondary | ICD-10-CM

## 2012-12-10 DIAGNOSIS — Z79899 Other long term (current) drug therapy: Secondary | ICD-10-CM | POA: Insufficient documentation

## 2012-12-10 DIAGNOSIS — S81009A Unspecified open wound, unspecified knee, initial encounter: Secondary | ICD-10-CM | POA: Insufficient documentation

## 2012-12-10 DIAGNOSIS — Z8639 Personal history of other endocrine, nutritional and metabolic disease: Secondary | ICD-10-CM | POA: Insufficient documentation

## 2012-12-10 DIAGNOSIS — S8010XA Contusion of unspecified lower leg, initial encounter: Secondary | ICD-10-CM | POA: Insufficient documentation

## 2012-12-10 DIAGNOSIS — D509 Iron deficiency anemia, unspecified: Secondary | ICD-10-CM | POA: Insufficient documentation

## 2012-12-10 DIAGNOSIS — Z8709 Personal history of other diseases of the respiratory system: Secondary | ICD-10-CM | POA: Insufficient documentation

## 2012-12-10 DIAGNOSIS — Z23 Encounter for immunization: Secondary | ICD-10-CM | POA: Insufficient documentation

## 2012-12-10 DIAGNOSIS — Y9229 Other specified public building as the place of occurrence of the external cause: Secondary | ICD-10-CM | POA: Insufficient documentation

## 2012-12-10 DIAGNOSIS — S81812A Laceration without foreign body, left lower leg, initial encounter: Secondary | ICD-10-CM

## 2012-12-10 DIAGNOSIS — S8000XA Contusion of unspecified knee, initial encounter: Secondary | ICD-10-CM | POA: Insufficient documentation

## 2012-12-10 DIAGNOSIS — M1990 Unspecified osteoarthritis, unspecified site: Secondary | ICD-10-CM | POA: Insufficient documentation

## 2012-12-10 MED ORDER — HYDROCODONE-ACETAMINOPHEN 5-325 MG PO TABS: 1.0000 | ORAL_TABLET | Freq: Four times a day (QID) | ORAL | Status: DC | PRN

## 2012-12-10 MED ORDER — TETANUS-DIPHTH-ACELL PERTUSSIS 5-2.5-18.5 LF-MCG/0.5 IM SUSP
0.5000 mL | Freq: Once | INTRAMUSCULAR | Status: AC
  Administered 2012-12-10: 0.5 mL via INTRAMUSCULAR
  Filled 2012-12-10: qty 0.5

## 2012-12-10 NOTE — ED Notes (Signed)
Bed:WA24<BR> Expected date:<BR> Expected time:<BR> Means of arrival:<BR> Comments:<BR> EMS

## 2012-12-10 NOTE — ED Provider Notes (Signed)
History     CSN: [id number]  Arrival date & time 12/10/12  1846   First MD Initiated Contact with Patient 12/10/12 1847      Chief Complaint  Patient presents with  . Fall    (Consider location/radiation/quality/duration/timing/severity/associated sxs/prior treatment) HPI Patient presents to the emergency department following a fall that occurred just prior to arrival.  Patient, states she was walking in [place] when she slipped on some water, states she fell on her right knee and then hit her left lower leg against the shopping cart.  Patient, states, that she has a small wound to her left lower leg.  EMS applied a dressing to the area.  Patient denies syncope, dizziness, weakness, nausea, vomiting, neck pain, back pain, abdominal pain, chest pain, or shortness of breath. Past Medical History  Diagnosis Date  . Deviated nasal septum   . Colon polyp 2008    on last colonoscopy with Dr. Loreta Ave in 2008  . Iron deficiency anemia   . Hypercholesteremia   . Osteoarthritis   . Allergic rhinitis   . Vitamin B12 deficiency   . Diverticulosis 2008    on last colonoscopy with Dr. Loreta Ave in 2008    Past Surgical History  Procedure Laterality Date  . Tonsillectomy    . Cesarean section    . Nasal sinus surgery      Family History  Problem Relation Age of Onset  . Hypertension Mother   . Hepatitis Mother   . Heart attack Father   . Hydrocephalus Father   . Cancer Maternal Aunt 80    breast cancer    History  Substance Use Topics  . Smoking status: Never Smoker   . Smokeless tobacco: Not on file  . Alcohol Use: Yes     Comment: 2-3 glasses of wine a week    OB History   Grav Para Term Preterm Abortions TAB SAB Ect Mult Living   3 2 2       2       Review of Systems All other systems negative except as documented in the HPI. All pertinent positives and negatives as reviewed in the HPI. Allergies  Bactrim  Home Medications   Current Outpatient Rx  Name  Route  Sig   Dispense  Refill  . acetaminophen (TYLENOL) 500 MG tablet   Oral   Take 1,000 mg by mouth every 6 (six) hours as needed for pain.         . ferrous sulfate 325 (65 FE) MG tablet   Oral   Take 1 tablet (325 mg total) by mouth 2 (two) times daily with a meal.   60 tablet   0   . fexofenadine-pseudoephedrine (ALLEGRA-D) 60-120 MG per tablet   Oral   Take 1 tablet by mouth daily as needed (for allergies). Patient is using this medication for allergies.         . fluticasone (FLONASE) 50 MCG/ACT nasal spray   Nasal   Place 2 sprays into the nose daily as needed for allergies.          . folic acid (FOLVITE) 1 MG tablet   Oral   Take 1 tablet (1 mg total) by mouth daily.   30 tablet   0   . metoprolol tartrate (LOPRESSOR) 25 MG tablet   Oral   Take 25 mg by mouth 2 (two) times daily.         . Multiple Vitamin (MULTIVITAMIN WITH MINERALS) TABS   Oral  Take 1 tablet by mouth daily.         Marland Kitchen omeprazole (PRILOSEC) 20 MG capsule   Oral   Take 20 mg by mouth daily.         . pravastatin (PRAVACHOL) 40 MG tablet   Oral   Take 40 mg by mouth daily.           BP 128/81  Pulse 75  Temp(Src) 98.7 F (37.1 C) (Oral)  Resp 14  SpO2 97%  Physical Exam  Constitutional: She is oriented to person, place, and time. She appears well-developed and well-nourished. No distress.  HENT:  Head: Normocephalic and atraumatic.  Mouth/Throat: Oropharynx is clear and moist.  Eyes: Pupils are equal, round, and reactive to light.  Cardiovascular: Normal rate and regular rhythm.   Pulmonary/Chest: Effort normal and breath sounds normal.  Musculoskeletal:       Right knee: She exhibits ecchymosis.       Cervical back: Normal.       Thoracic back: Normal.       Lumbar back: Normal.       Left lower leg: She exhibits tenderness and swelling.       Legs: Neurological: She is alert and oriented to person, place, and time.  Skin: Skin is warm and dry.    ED Course    Procedures (including critical care time) LACERATION REPAIR Performed by: Carlyle Dolly Authorized by: Carlyle Dolly Consent: Verbal consent obtained. Risks and benefits: risks, benefits and alternatives were discussed Consent given by: patient Patient identity confirmed: provided demographic data Prepped and Draped in normal sterile fashion Wound explored  Laceration Location: Left anterior lower leg  Laceration Length: 1 cm  No Foreign Bodies seen or palpated  Anesthesia: local infiltration  Local anesthetic: lidocaine 2 % with epinephrine  Anesthetic total: 6 ml  Irrigation method: syringe Amount of cleaning: standard  Skin closure: 4-0 Prolene   Number of sutures: 4  Technique: Simple interrupted   Patient tolerance: Patient tolerated the procedure well with no immediate complications.  Patient has no signs of fracture noted on plain films.  Patient does have a hematoma, which I tried to evacuate after numbing the patient's wound was unsuccessful.  Patient will have compression wrap placed on the area along with clean dressing.  MDM         Carlyle Dolly, PA-C 12/10/12 2024  Medical screening examination/treatment/procedure(s) were conducted as a shared visit with non-physician practitioner(s) and myself.  I personally evaluated the patient during the encounter  Derwood Kaplan, MD 12/10/12 2354

## 2012-12-10 NOTE — ED Notes (Signed)
Ice applied to left lower extremity bruising and swelling.

## 2012-12-10 NOTE — ED Notes (Signed)
Fell in [place] on right knee, left chin hit grocery cart.  1/4 inch laceration on left lower leg.  No LOC, did not hit head.  No c/o pain with non-weight bearing.

## 2012-12-10 NOTE — Telephone Encounter (Signed)
Informed pt of need to r/s office visit today as Jenna [name] out of office unexpectedly.  She verbalized understanding and will await call from Scheduler.

## 2012-12-14 ENCOUNTER — Telehealth: Payer: Self-pay | Admitting: *Deleted

## 2012-12-14 NOTE — Telephone Encounter (Signed)
Lm gv appt d/t for 12/28/12 w/ 2:45pm lab and 3:15pm ov. i also made the pt aware that i would mail a letter/ cal as well...td

## 2012-12-28 ENCOUNTER — Encounter: Payer: Self-pay | Admitting: Oncology

## 2012-12-28 ENCOUNTER — Ambulatory Visit (HOSPITAL_BASED_OUTPATIENT_CLINIC_OR_DEPARTMENT_OTHER): Payer: BC Managed Care – PPO | Admitting: Oncology

## 2012-12-28 ENCOUNTER — Telehealth: Payer: Self-pay | Admitting: Oncology

## 2012-12-28 ENCOUNTER — Other Ambulatory Visit (HOSPITAL_BASED_OUTPATIENT_CLINIC_OR_DEPARTMENT_OTHER): Payer: BC Managed Care – PPO | Admitting: Lab

## 2012-12-28 ENCOUNTER — Ambulatory Visit (HOSPITAL_BASED_OUTPATIENT_CLINIC_OR_DEPARTMENT_OTHER): Payer: BC Managed Care – PPO

## 2012-12-28 VITALS — BP 124/77 | HR 68 | Temp 98.0°F | Resp 19 | Ht 64.0 in | Wt 171.0 lb

## 2012-12-28 DIAGNOSIS — E538 Deficiency of other specified B group vitamins: Secondary | ICD-10-CM

## 2012-12-28 DIAGNOSIS — E785 Hyperlipidemia, unspecified: Secondary | ICD-10-CM

## 2012-12-28 DIAGNOSIS — E7800 Pure hypercholesterolemia, unspecified: Secondary | ICD-10-CM

## 2012-12-28 DIAGNOSIS — R002 Palpitations: Secondary | ICD-10-CM

## 2012-12-28 DIAGNOSIS — D509 Iron deficiency anemia, unspecified: Secondary | ICD-10-CM

## 2012-12-28 LAB — CBC WITH DIFFERENTIAL/PLATELET
Basophils Absolute: 0 10*3/uL (ref 0.0–0.1)
EOS%: 2.5 % (ref 0.0–7.0)
HCT: 38.3 % (ref 34.8–46.6)
HGB: 13.3 g/dL (ref 11.6–15.9)
LYMPH%: 27.5 % (ref 14.0–49.7)
MCH: 33.7 pg (ref 25.1–34.0)
MONO#: 0.4 10*3/uL (ref 0.1–0.9)
NEUT%: 63.5 % (ref 38.4–76.8)
Platelets: 257 10*3/uL (ref 145–400)
lymph#: 2.1 10*3/uL (ref 0.9–3.3)

## 2012-12-28 LAB — COMPREHENSIVE METABOLIC PANEL (CC13)
ALT: 10 U/L (ref 0–55)
AST: 16 U/L (ref 5–34)
Creatinine: 0.8 mg/dL (ref 0.6–1.1)
Sodium: 143 mEq/L (ref 136–145)
Total Bilirubin: 0.43 mg/dL (ref 0.20–1.20)

## 2012-12-28 MED ORDER — CYANOCOBALAMIN 1000 MCG/ML IJ SOLN
1000.0000 ug | Freq: Once | INTRAMUSCULAR | Status: AC
  Administered 2012-12-28: 1000 ug via INTRAMUSCULAR

## 2012-12-28 NOTE — Progress Notes (Signed)
[facility]  Telephone:([phone] Fax:(336) [phone]   OFFICE PROGRESS NOTE   Cc:  Mickie Hillier, MD  DIAGNOSIS: Iron deficiency and vitamin B12 deficiency  PAST THERAPY: She received 2 units of packed red blood cells in August 2013 for hemoglobin of 6.  CURRENT THERAPY: Ferrous sulfate 325 mg daily and vitamin B12 injections monthly.  INTERVAL HISTORY: Jenna [name] 57 y.o. female returns for routine followup by herself. The patient has continued take oral iron without any difficulty. She denies abdominal pain, nausea, vomiting, or constipation. She states she has been receiving her B12 injections at her primary care provider's office. States she has difficulty making her appointments monthly, but has been getting injections about every other month. Denies chest pain, shortness of breath, dyspnea on exertion. Denies fatigue. No epistaxis, hemoptysis, hematemesis, hematuria, melena, hematochezia.  Past Medical History  Diagnosis Date  . Deviated nasal septum   . Colon polyp 2008    on last colonoscopy with Dr. Loreta Ave in 2008  . Iron deficiency anemia   . Hypercholesteremia   . Osteoarthritis   . Allergic rhinitis   . Vitamin B12 deficiency   . Diverticulosis 2008    on last colonoscopy with Dr. Loreta Ave in 2008    Past Surgical History  Procedure Laterality Date  . Tonsillectomy    . Cesarean section    . Nasal sinus surgery      Current Outpatient Prescriptions  Medication Sig Dispense Refill  . acetaminophen (TYLENOL) 500 MG tablet Take 1,000 mg by mouth every 6 (six) hours as needed for pain.      . ferrous sulfate 325 (65 FE) MG tablet Take 1 tablet (325 mg total) by mouth 2 (two) times daily with a meal.  60 tablet  0  . fexofenadine-pseudoephedrine (ALLEGRA-D) 60-120 MG per tablet Take 1 tablet by mouth daily as needed (for allergies). Patient is using this medication for allergies.      . fluticasone (FLONASE) 50 MCG/ACT nasal spray Place 2  sprays into the nose daily as needed for allergies.       . folic acid (FOLVITE) 1 MG tablet Take 1 tablet (1 mg total) by mouth daily.  30 tablet  0  . HYDROcodone-acetaminophen (NORCO/VICODIN) 5-325 MG per tablet Take 1 tablet by mouth every 6 (six) hours as needed for pain.  15 tablet  0  . metoprolol tartrate (LOPRESSOR) 25 MG tablet Take 25 mg by mouth 2 (two) times daily.      . Multiple Vitamin (MULTIVITAMIN WITH MINERALS) TABS Take 1 tablet by mouth daily.      Marland Kitchen omeprazole (PRILOSEC) 20 MG capsule Take 20 mg by mouth daily.      . pravastatin (PRAVACHOL) 40 MG tablet Take 40 mg by mouth daily.       No current facility-administered medications for this visit.    ALLERGIES:  is allergic to bactrim.  REVIEW OF SYSTEMS:  The rest of the 14-point review of system was negative.   Filed Vitals:   12/28/12 1525  BP: 124/77  Pulse: 68  Temp: 98 F (36.7 C)  Resp: 19   Wt Readings from Last 3 Encounters:  12/28/12 171 lb (77.565 kg)  03/11/12 165 lb 1.6 oz (74.889 kg)  02/26/12 166 lb 7.2 oz (75.5 kg)   ECOG Performance status: 0  PHYSICAL EXAMINATION:  General:  well-nourished in no acute distress.  Eyes:  no scleral icterus.  ENT:  There were no oropharyngeal lesions.  Neck  was without thyromegaly.  Lymphatics:  Negative cervical, supraclavicular or axillary adenopathy.  Respiratory: lungs were clear bilaterally without wheezing or crackles.  Cardiovascular:  Regular rate and rhythm, S1/S2, without murmur, rub or gallop.  There was no pedal edema.  GI:  abdomen was soft, flat, nontender, nondistended, without organomegaly.  Muscoloskeletal:  no spinal tenderness of palpation of vertebral spine.  Skin exam was without echymosis, petichae.  Neuro exam was nonfocal.  Patient was able to get on and off exam table without assistance.  Gait was normal.  Patient was alerted and oriented.  Attention was good.   Language was appropriate.  Mood was normal without depression.  Speech was not  pressured.  Thought content was not tangential.     LABORATORY/RADIOLOGY DATA:  Lab Results  Component Value Date   WBC 7.5 12/28/2012   HGB 13.3 12/28/2012   HCT 38.3 12/28/2012   PLT 257 12/28/2012   GLUCOSE 65* 12/28/2012   ALKPHOS 73 12/28/2012   ALT 10 12/28/2012   AST 16 12/28/2012   NA 143 12/28/2012   K 3.8 12/28/2012   CL 108* 12/28/2012   CREATININE 0.8 12/28/2012   BUN 12.1 12/28/2012   CO2 28 12/28/2012   INR 0.99 02/26/2012   ASSESSMENT AND PLAN:   1. Iron deficiency anemia: The patient is receiving oral iron with normalization of her hemoglobin. Recommend that she continue oral iron as she is already taking. GI workup by Dr. Loreta Ave identified an old ulcer which was healing. She has no active bleeding at this time. Her iron studies are pending today. 2. Vitamin B12 deficiency: The patient receives B12 injections monthly. She received her B12 injection here in our office but prefers to receive her injections monthly at her primary care provider's office. Her B12 level is pending today 3. Followup: Since the patient's hemoglobin has been normal for greater than 6 months out plan to bring her back for followup in one year.   The length of time of the face-to-face encounter was 15 minutes. More than 50% of time was spent counseling and coordination of care.

## 2012-12-29 ENCOUNTER — Encounter: Payer: Self-pay | Admitting: Oncology

## 2012-12-31 LAB — METHYLMALONIC ACID, SERUM: Methylmalonic Acid, Quant: 0.16 umol/L (ref <0.40)

## 2012-12-31 LAB — VITAMIN B12: Vitamin B-12: 270 pg/mL (ref 211–911)

## 2012-12-31 LAB — IRON AND TIBC
TIBC: 307 ug/dL (ref 250–470)
UIBC: 190 ug/dL (ref 125–400)

## 2013-05-26 ENCOUNTER — Other Ambulatory Visit: Payer: Self-pay

## 2013-08-22 ENCOUNTER — Ambulatory Visit (INDEPENDENT_AMBULATORY_CARE_PROVIDER_SITE_OTHER): Payer: BC Managed Care – PPO | Admitting: Interventional Cardiology

## 2013-08-22 ENCOUNTER — Encounter: Payer: Self-pay | Admitting: Interventional Cardiology

## 2013-08-22 VITALS — BP 118/80 | HR 68 | Ht 64.0 in | Wt 170.0 lb

## 2013-08-22 DIAGNOSIS — E785 Hyperlipidemia, unspecified: Secondary | ICD-10-CM

## 2013-08-22 DIAGNOSIS — I4891 Unspecified atrial fibrillation: Secondary | ICD-10-CM | POA: Insufficient documentation

## 2013-08-22 DIAGNOSIS — K254 Chronic or unspecified gastric ulcer with hemorrhage: Secondary | ICD-10-CM

## 2013-08-22 HISTORY — DX: Unspecified atrial fibrillation: I48.91

## 2013-08-22 NOTE — Progress Notes (Signed)
Patient ID: Jenna [name], female   DOB: 02-Aug-1955, 58 y.o.   MRN: [id number]    [address] [place], [place]  [address] Phone: [phone](336) [phone] Fax:  [phone]  Date:  08/22/2013   ID:  Jenna [name], DOB 02-Aug-1955, MRN [id number]  PCP:  Mickie HillierLITTLE,KEVIN LORNE, MD      History of Present Illness: Jenna [name] is a [age] y.o. female y/o who had PAF found on a monitor. Atrial Fibrillation F/U:  Denies : Chest pain.  Dizziness.  Leg edema.  Orthopnea.  Palpitations none since starting metoprolol. .  Shortness of breath.  Syncope.  No bleeding issues with aspirin.  She is taking metoprolol only at night. She stopped taking her statin due to knee pain.   Wt Readings from Last 3 Encounters:  08/22/13 170 lb (77.111 kg)  12/28/12 171 lb (77.565 kg)  03/11/12 165 lb 1.6 oz (74.889 kg)     Past Medical History  Diagnosis Date  . Deviated nasal septum   . Colon polyp 2008    on last colonoscopy with Dr. Loreta AveMann in 2008  . Iron deficiency anemia   . Hypercholesteremia   . Osteoarthritis   . Allergic rhinitis   . Vitamin B12 deficiency   . Diverticulosis 2008    on last colonoscopy with Dr. Loreta AveMann in 2008    Current Outpatient Prescriptions  Medication Sig Dispense Refill  . acetaminophen (TYLENOL) 500 MG tablet Take 1,000 mg by mouth every 6 (six) hours as needed for pain.      . fexofenadine-pseudoephedrine (ALLEGRA-D) 60-120 MG per tablet Take 1 tablet by mouth daily as needed (for allergies). Patient is using this medication for allergies.      . fluticasone (FLONASE) 50 MCG/ACT nasal spray Place 2 sprays into the nose daily as needed for allergies.       Marland Kitchen. HYDROcodone-acetaminophen (NORCO/VICODIN) 5-325 MG per tablet Take 1 tablet by mouth every 6 (six) hours as needed for pain.  15 tablet  0  . metoprolol tartrate (LOPRESSOR) 25 MG tablet Take 25 mg by mouth 2 (two) times daily.      . Multiple Vitamin (MULTIVITAMIN WITH MINERALS) TABS Take 1 tablet by mouth  daily.      Marland Kitchen. omeprazole (PRILOSEC) 20 MG capsule Take 20 mg by mouth daily.      . pravastatin (PRAVACHOL) 40 MG tablet Take 40 mg by mouth daily.      . ferrous sulfate 325 (65 FE) MG tablet Take 1 tablet (325 mg total) by mouth 2 (two) times daily with a meal.  60 tablet  0   No current facility-administered medications for this visit.    Allergies:    Allergies  Allergen Reactions  . Bactrim [Sulfamethoxazole-Trimethoprim] Other (See Comments)    Canker sores in her mouth.    Social History:  The patient  reports that she has never smoked. She does not have any smokeless tobacco history on file. She reports that she drinks alcohol. She reports that she does not use illicit drugs.   Family History:  The patient's family history includes Cancer (age of onset: 5880) in her maternal aunt; Heart attack in her father; Hepatitis in her mother; Hydrocephalus in her father; Hypertension in her mother.   ROS:  Please see the history of present illness.  No nausea, vomiting.  No fevers, chills.  No focal weakness.  No dysuria. She has not been taking her metoprolol or statin as prescribed.  All other systems reviewed and negative.   PHYSICAL EXAM: VS:  BP 118/80  Pulse 68  Ht 5\' 4"  (1.626 m)  Wt 170 lb (77.111 kg)  BMI 29.17 kg/m2 Well nourished, well developed, in no acute distress HEENT: normal Neck: no JVD, no carotid bruits Cardiac:  normal S1, S2; RRR;  Lungs:  clear to auscultation bilaterally, no wheezing, rhonchi or rales Abd: soft, nontender, no hepatomegaly Ext: no edema Skin: warm and dry Neuro:   no focal abnormalities noted  EKG:  NSR, NSST    ASSESSMENT AND PLAN:  Atrial fibrillation  Continue Metoprolol Tartrate Tablet, 25 MG, 1 tablet, Orally, Once a day at night Continue Aspirin Tablet, 81 MG, 1 tablet, Orally, Once a day Resolved on metoprolol.  COntinue routine thyroid testing. Had ulcer when using large aounts of excedrin.  2. Chest Tightness  Improved since  AFib resolved.  3. Hypercholesterolemia, Mixed  Continue Pravastatin Sodium tablet, 40 MG, 1 tablet, by mouth, Once a day this has been controlled with pravastatin in the past. LDL 127 at last check. She should take this regularly.  It is not causing her knee pain.  No muscle pain.  Needs lipid check.   I recommended that she followup with Dr. Clarene Duke regarding her routine preventative care. 4. gastric Ulcer with hemorrhage in 2013: Occurred after using significant amounts of Excedrin.  Required blood transfusion in August 2013. Therefore, we'll keep dose of aspirin at the lower dose for stroke prevention in A. fib.   Signed, Fredric Mare, MD, [facility] 08/22/2013 1:43 PM

## 2013-08-22 NOTE — Patient Instructions (Signed)
Your physician wants you to follow-up in: 1 year with Dr. Varanasi. You will receive a reminder letter in the mail two months in advance. If you don't receive a letter, please call our office to schedule the follow-up appointment.  Your physician recommends that you continue on your current medications as directed. Please refer to the Current Medication list given to you today.  

## 2013-08-23 ENCOUNTER — Other Ambulatory Visit: Payer: Self-pay

## 2013-08-23 DIAGNOSIS — Z1231 Encounter for screening mammogram for malignant neoplasm of breast: Secondary | ICD-10-CM

## 2013-09-20 ENCOUNTER — Ambulatory Visit
Admission: RE | Admit: 2013-09-20 | Discharge: 2013-09-20 | Disposition: A | Discharge status: Home / Self Care | Payer: Self-pay | Source: Ambulatory Visit

## 2013-09-20 DIAGNOSIS — Z1231 Encounter for screening mammogram for malignant neoplasm of breast: Secondary | ICD-10-CM

## 2013-10-04 ENCOUNTER — Other Ambulatory Visit: Payer: Self-pay

## 2013-11-29 ENCOUNTER — Other Ambulatory Visit: Payer: Self-pay

## 2013-11-29 MED ORDER — OMEPRAZOLE 20 MG PO CPDR: 20.0000 mg | DELAYED_RELEASE_CAPSULE | Freq: Every day | ORAL | Status: DC

## 2013-12-22 ENCOUNTER — Other Ambulatory Visit (HOSPITAL_COMMUNITY)
Admission: RE | Admit: 2013-12-22 | Discharge: 2013-12-22 | Disposition: A | Discharge status: Home / Self Care | Payer: BC Managed Care – PPO | Source: Ambulatory Visit | Attending: Family Medicine | Admitting: Family Medicine

## 2013-12-22 ENCOUNTER — Other Ambulatory Visit: Payer: Self-pay | Admitting: Family Medicine

## 2013-12-22 DIAGNOSIS — Z01419 Encounter for gynecological examination (general) (routine) without abnormal findings: Secondary | ICD-10-CM | POA: Insufficient documentation

## 2013-12-22 DIAGNOSIS — Z1151 Encounter for screening for human papillomavirus (HPV): Secondary | ICD-10-CM | POA: Insufficient documentation

## 2013-12-23 LAB — CYTOLOGY - PAP

## 2013-12-26 ENCOUNTER — Telehealth: Payer: Self-pay | Admitting: Hematology and Oncology

## 2013-12-26 NOTE — Telephone Encounter (Signed)
pt cld to state that she has no idea abotht his appt-adv prev seen here by Dr Gaylyn Rong. Adv pt that NG is a Hemo/Onco-pt stated cancel this appt and refused to be seen-adv i would note the acct

## 2013-12-28 ENCOUNTER — Ambulatory Visit: Payer: Self-pay | Admitting: Hematology and Oncology

## 2013-12-28 ENCOUNTER — Other Ambulatory Visit: Payer: Self-pay

## 2014-04-13 ENCOUNTER — Other Ambulatory Visit: Payer: Self-pay | Admitting: Interventional Cardiology

## 2014-05-22 ENCOUNTER — Encounter: Payer: Self-pay | Admitting: Interventional Cardiology

## 2014-07-28 ENCOUNTER — Emergency Department (HOSPITAL_COMMUNITY)
Admission: EM | Admit: 2014-07-28 | Discharge: 2014-07-28 | Disposition: A | Discharge status: Home / Self Care | Payer: BLUE CROSS/BLUE SHIELD | Attending: Emergency Medicine | Admitting: Emergency Medicine

## 2014-07-28 ENCOUNTER — Encounter (HOSPITAL_COMMUNITY): Payer: Self-pay | Admitting: *Deleted

## 2014-07-28 ENCOUNTER — Emergency Department (HOSPITAL_COMMUNITY): Payer: BLUE CROSS/BLUE SHIELD

## 2014-07-28 DIAGNOSIS — Z86010 Personal history of colonic polyps: Secondary | ICD-10-CM | POA: Diagnosis not present

## 2014-07-28 DIAGNOSIS — E780 Pure hypercholesterolemia: Secondary | ICD-10-CM | POA: Insufficient documentation

## 2014-07-28 DIAGNOSIS — Z7982 Long term (current) use of aspirin: Secondary | ICD-10-CM | POA: Insufficient documentation

## 2014-07-28 DIAGNOSIS — Z8679 Personal history of other diseases of the circulatory system: Secondary | ICD-10-CM | POA: Insufficient documentation

## 2014-07-28 DIAGNOSIS — D509 Iron deficiency anemia, unspecified: Secondary | ICD-10-CM | POA: Diagnosis not present

## 2014-07-28 DIAGNOSIS — M1990 Unspecified osteoarthritis, unspecified site: Secondary | ICD-10-CM | POA: Diagnosis not present

## 2014-07-28 DIAGNOSIS — Z8709 Personal history of other diseases of the respiratory system: Secondary | ICD-10-CM | POA: Insufficient documentation

## 2014-07-28 DIAGNOSIS — R079 Chest pain, unspecified: Secondary | ICD-10-CM | POA: Diagnosis not present

## 2014-07-28 DIAGNOSIS — Z79899 Other long term (current) drug therapy: Secondary | ICD-10-CM | POA: Diagnosis not present

## 2014-07-28 DIAGNOSIS — Z8719 Personal history of other diseases of the digestive system: Secondary | ICD-10-CM | POA: Insufficient documentation

## 2014-07-28 LAB — CBC
HEMATOCRIT: 42 % (ref 36.0–46.0)
Hemoglobin: 13.8 g/dL (ref 12.0–15.0)
MCH: 31.1 pg (ref 26.0–34.0)
MCHC: 32.9 g/dL (ref 30.0–36.0)
MCV: 94.6 fL (ref 78.0–100.0)
PLATELETS: 288 10*3/uL (ref 150–400)
RBC: 4.44 MIL/uL (ref 3.87–5.11)
RDW: 12.9 % (ref 11.5–15.5)
WBC: 7.5 10*3/uL (ref 4.0–10.5)

## 2014-07-28 LAB — BASIC METABOLIC PANEL
Anion gap: 9 (ref 5–15)
BUN: 10 mg/dL (ref 6–23)
CO2: 27 mmol/L (ref 19–32)
CREATININE: 0.72 mg/dL (ref 0.50–1.10)
Calcium: 9.3 mg/dL (ref 8.4–10.5)
Chloride: 105 mEq/L (ref 96–112)
GLUCOSE: 92 mg/dL (ref 70–99)
POTASSIUM: 3.7 mmol/L (ref 3.5–5.1)
Sodium: 141 mmol/L (ref 135–145)

## 2014-07-28 LAB — TROPONIN I

## 2014-07-28 LAB — I-STAT TROPONIN, ED: Troponin i, poc: 0 ng/mL (ref 0.00–0.08)

## 2014-07-28 MED ORDER — ASPIRIN 325 MG PO TABS
325.0000 mg | ORAL_TABLET | ORAL | Status: AC
  Administered 2014-07-28: 325 mg via ORAL
  Filled 2014-07-28: qty 1

## 2014-07-28 MED ORDER — GI COCKTAIL ~~LOC~~
30.0000 mL | Freq: Once | ORAL | Status: AC
  Administered 2014-07-28: 30 mL via ORAL
  Filled 2014-07-28: qty 30

## 2014-07-28 MED ORDER — NITROGLYCERIN 0.4 MG SL SUBL
0.4000 mg | SUBLINGUAL_TABLET | SUBLINGUAL | Status: DC | PRN
  Filled 2014-07-28: qty 1

## 2014-07-28 MED ORDER — FAMOTIDINE 20 MG PO TABS: 20.0000 mg | ORAL_TABLET | Freq: Two times a day (BID) | ORAL | Status: DC

## 2014-07-28 NOTE — Discharge Instructions (Signed)
Please call your doctor for a followup appointment within 24-48 hours. When you talk to your doctor please let them know that you were seen in the emergency department and have them acquire all of your records so that they can discuss the findings with you and formulate a treatment plan to fully care for your new and ongoing problems.  Chest Pain (Nonspecific) It is often hard to give a specific diagnosis for the cause of chest pain. There is always a chance that your pain could be related to something serious, such as a heart attack or a blood clot in the lungs. You need to follow up with your health care provider for further evaluation. CAUSES   Heartburn.  Pneumonia or bronchitis.  Anxiety or stress.  Inflammation around your heart (pericarditis) or lung (pleuritis or pleurisy).  A blood clot in the lung.  A collapsed lung (pneumothorax). It can develop suddenly on its own (spontaneous pneumothorax) or from trauma to the chest.  Shingles infection (herpes zoster virus). The chest wall is composed of bones, muscles, and cartilage. Any of these can be the source of the pain.  The bones can be bruised by injury.  The muscles or cartilage can be strained by coughing or overwork.  The cartilage can be affected by inflammation and become sore (costochondritis). DIAGNOSIS  Lab tests or other studies may be needed to find the cause of your pain. Your health care provider may have you take a test called an ambulatory electrocardiogram (ECG). An ECG records your heartbeat patterns over a 24-hour period. You may also have other tests, such as:  Transthoracic echocardiogram (TTE). During echocardiography, sound waves are used to evaluate how blood flows through your heart.  Transesophageal echocardiogram (TEE).  Cardiac monitoring. This allows your health care provider to monitor your heart rate and rhythm in real time.  Holter monitor. This is a portable device that records your heartbeat  and can help diagnose heart arrhythmias. It allows your health care provider to track your heart activity for several days, if needed.  Stress tests by exercise or by giving medicine that makes the heart beat faster. TREATMENT   Treatment depends on what may be causing your chest pain. Treatment may include:  Acid blockers for heartburn.  Anti-inflammatory medicine.  Pain medicine for inflammatory conditions.  Antibiotics if an infection is present.  You may be advised to change lifestyle habits. This includes stopping smoking and avoiding alcohol, caffeine, and chocolate.  You may be advised to keep your head raised (elevated) when sleeping. This reduces the chance of acid going backward from your stomach into your esophagus. Most of the time, nonspecific chest pain will improve within 2-3 days with rest and mild pain medicine.  HOME CARE INSTRUCTIONS   If antibiotics were prescribed, take them as directed. Finish them even if you start to feel better.  For the next few days, avoid physical activities that bring on chest pain. Continue physical activities as directed.  Do not use any tobacco products, including cigarettes, chewing tobacco, or electronic cigarettes.  Avoid drinking alcohol.  Only take medicine as directed by your health care provider.  Follow your health care provider's suggestions for further testing if your chest pain does not go away.  Keep any follow-up appointments you made. If you do not go to an appointment, you could develop lasting (chronic) problems with pain. If there is any problem keeping an appointment, call to reschedule. SEEK MEDICAL CARE IF:   Your chest pain  does not go away, even after treatment.  You have a rash with blisters on your chest.  You have a fever. SEEK IMMEDIATE MEDICAL CARE IF:   You have increased chest pain or pain that spreads to your arm, neck, jaw, back, or abdomen.  You have shortness of breath.  You have an  increasing cough, or you cough up blood.  You have severe back or abdominal pain.  You feel nauseous or vomit.  You have severe weakness.  You faint.  You have chills. This is an emergency. Do not wait to see if the pain will go away. Get medical help at once. Call your local emergency services (911 in [place] Do not drive yourself to the hospital. MAKE SURE YOU:   Understand these instructions.  Will watch your condition.  Will get help right away if you are not doing well or get worse. Document Released: 04/16/2005 Document Revised: 07/12/2013 Document Reviewed: 02/10/2008 [facility] Patient Information 2015 [place], [place]. This information is not intended to replace advice given to you by your health care provider. Make sure you discuss any questions you have with your health care provider.

## 2014-07-28 NOTE — ED Provider Notes (Signed)
CSN: [id number]     Arrival date & time 07/28/14  1243 History   First MD Initiated Contact with Patient 07/28/14 1601     Chief Complaint  Patient presents with  . Chest Pain     (Consider location/radiation/quality/duration/timing/severity/associated sxs/prior Treatment) HPI Comments: The patient is a 59 year old Jenna [name] with a history of hypercholesterolemia and paroxysmal A. fib which was present after a bout of severe anemia several years ago. She does not take any daily medications including Lopressor which was prescribed at that time or the statin medication for her cholesterol (makes her body ache). She reports that over the last several days she has had a left-sided heaviness in her chest which is intermittent, occurs mostly in the evening hours and in the morning hours, is not exertional, is not positional and is not related to shortness of breath, cough, fever, swelling of the lower extremities. She has no history of stress test or heart catheterization though she has had a Holter monitor test when she was diagnosed with paroxysmal atrial fibrillation when she was severely anemic. As of this time she has no knowledge of recurrent anemia, she does not smoke cigarettes, she has no family history of early heart disease, she does not have any diagnosis of hypertension or diabetes. At this time her symptoms are intermittent, they are mild.  Patient is a 59 y.o. Jenna [name] presenting with chest pain. The history is provided by the patient and the spouse.  Chest Pain   Past Medical History  Diagnosis Date  . Deviated nasal septum   . Colon polyp 2008    on last colonoscopy with Dr. Loreta Ave in 2008  . Iron deficiency anemia   . Hypercholesteremia   . Osteoarthritis   . Allergic rhinitis   . Vitamin B12 deficiency   . Diverticulosis 2008    on last colonoscopy with Dr. Loreta Ave in 2008  . Atrial fibrillation 08/22/2013   Past Surgical History  Procedure Laterality Date  . Tonsillectomy    .  Cesarean section    . Nasal sinus surgery     Family History  Problem Relation Age of Onset  . Hypertension Mother   . Hepatitis Mother   . Heart attack Father   . Hydrocephalus Father   . Cancer Maternal Aunt 80    breast cancer   History  Substance Use Topics  . Smoking status: Never Smoker   . Smokeless tobacco: Not on file  . Alcohol Use: Yes     Comment: 2-3 glasses of wine a week   OB History    Gravida Para Term Preterm AB TAB SAB Ectopic Multiple Living   Review of Systems  Cardiovascular: Positive for chest pain.  All other systems reviewed and are negative.     Allergies  Bactrim  Home Medications   Prior to Admission medications   Medication Sig Start Date End Date Taking? Authorizing Provider  acetaminophen (TYLENOL) 500 MG tablet Take 1,000 mg by mouth every 6 (six) hours as needed for pain.    Historical Provider, MD  aspirin 81 MG tablet Take 81 mg by mouth daily.    Historical Provider, MD  famotidine (PEPCID) 20 MG tablet Take 1 tablet (20 mg total) by mouth 2 (two) times daily. 07/28/14   Vida Roller, MD  ferrous sulfate 325 (65 FE) MG tablet Take 1 tablet (325 mg total) by mouth 2 (two) times daily with  a meal. 02/27/12 02/26/13  Rhetta Mura, MD  fexofenadine-pseudoephedrine (ALLEGRA-D) 60-120 MG per tablet Take 1 tablet by mouth daily as needed (for allergies). Patient is using this medication for allergies.    Historical Provider, MD  fluticasone (FLONASE) 50 MCG/ACT nasal spray Place 2 sprays into the nose daily as needed for allergies.     Historical Provider, MD  HYDROcodone-acetaminophen (NORCO/VICODIN) 5-325 MG per tablet Take 1 tablet by mouth every 6 (six) hours as needed for pain. 5/Jenna/14   Jamesetta Orleans Lawyer, PA-C  metoprolol tartrate (LOPRESSOR) 25 MG tablet Take 25 mg by mouth 2 (two) times daily.    Historical Provider, MD  Multiple Vitamin (MULTIVITAMIN WITH MINERALS) TABS Take 1 tablet by mouth daily.     Historical Provider, MD  omeprazole (PRILOSEC) 20 MG capsule TAKE ONE CAPSULE BY MOUTH ONCE DAILY 04/13/14   Corky Crafts, MD  pravastatin (PRAVACHOL) 40 MG tablet Take 40 mg by mouth daily.    Historical Provider, MD   BP 122/81 mmHg  Pulse 64  Temp(Src) 98.2 F (36.8 C) (Oral)  Resp 13  Ht  (1.626 m)  Wt 165 lb (74.844 kg)  BMI 28.31 kg/m2  SpO2 99% Physical Exam  Constitutional: She appears well-developed and well-nourished. No distress.  HENT:  Head: Normocephalic and atraumatic.  Mouth/Throat: Oropharynx is clear and moist. No oropharyngeal exudate.  Eyes: Conjunctivae and EOM are normal. Pupils are equal, round, and reactive to light. Right eye exhibits no discharge. Left eye exhibits no discharge. No scleral icterus.  Neck: Normal range of motion. Neck supple. No JVD present. No thyromegaly present.  Cardiovascular: Normal rate, regular rhythm, normal heart sounds and intact distal pulses.  Exam reveals no gallop and no friction rub.   No murmur heard. Pulmonary/Chest: Effort normal and breath sounds normal. No respiratory distress. She has no wheezes. She has no rales.  Abdominal: Soft. Bowel sounds are normal. She exhibits no distension and no mass. There is no tenderness.  Musculoskeletal: Normal range of motion. She exhibits no edema or tenderness.  Lymphadenopathy:    She has no cervical adenopathy.  Neurological: She is alert. Coordination normal.  Skin: Skin is warm and dry. No rash noted. No erythema.  Psychiatric: She has a normal mood and affect. Her behavior is normal.  Nursing note and vitals reviewed.   ED Course  Procedures (including critical care time) Labs Review Labs Reviewed  CBC  BASIC METABOLIC PANEL  TROPONIN I  Rosezena Sensor, ED    Imaging Review Dg Chest 2 View  07/28/2014   CLINICAL DATA:  Chest pressure for 3 days.  EXAM: CHEST  2 VIEW  COMPARISON:  None.  FINDINGS: There is a large hiatal hernia with an air-fluid level. The  lungs are clear. Heart and mediastinum are within normal limits. Negative for pleural effusions. Bony structures are unremarkable.  IMPRESSION: No active cardiopulmonary disease.  Large hiatal hernia.   Electronically Signed   By: Richarda Overlie M.D.   On: 07/28/2014 17:26     EKG Interpretation   Date/Time:  Friday July 28 2014 12:55:00 EST Ventricular Rate:  73 PR Interval:  148 QRS Duration: 94 QT Interval:  392 QTC Calculation: 431 R Axis:   -29 Text Interpretation:  Normal sinus rhythm Incomplete right bundle branch  block Borderline ECG since last tracing no significant change Confirmed by  Ercell Razon  MD, [place] Luddy ([id number]) on 07/28/2014 4:01:19 PM      MDM   Final diagnoses:  Chest pain  The patient has a heart score of 2, her vital signs are normal, EKG is unremarkable and her initial troponin is normal. I suspect that her symptoms are more gastrointestinal related given her history of a stomach ulcer seen by endoscopy according to the patient and her lack of compliance with her medications. I will obtain a second troponin, discussed with cardiology to arrange outpatient stress test follow-up, the patient is in agreement with the plan.  Filed Vitals:   07/28/14 1320 07/28/14 1622  BP: 118/78 135/82  Pulse: 72 70  Temp: 98.2 F (36.8 C)   TempSrc: Oral   Resp: 18 18  Height: 5\' 4"  (1.626 m)   Weight: 165 lb (74.844 kg)   SpO2: 96% 100%    Discussed with cardiology physician assistant  Who will set up outpatient stress test. Patient is in agreement. 2nd trop normal.  Pt in agreement with plan.  Repeat exam, no chest pain, clear heart and lung sounds  Vida RollerBrian D Caridad Silveira, MD 07/28/14 [phone]

## 2014-07-28 NOTE — ED Notes (Signed)
Pt reports CP started 3 days ago . Pain is pressure like and is on the Lt side. Pain does not radiate and is intermiten . Pt reports 3 episodes today.

## 2014-07-29 ENCOUNTER — Other Ambulatory Visit: Payer: Self-pay | Admitting: Interventional Cardiology

## 2014-07-31 ENCOUNTER — Other Ambulatory Visit: Payer: Self-pay | Admitting: Physician Assistant

## 2014-07-31 DIAGNOSIS — R079 Chest pain, unspecified: Secondary | ICD-10-CM

## 2014-08-01 ENCOUNTER — Telehealth (HOSPITAL_COMMUNITY): Payer: Self-pay

## 2014-08-01 NOTE — Telephone Encounter (Signed)
Encounter complete. 

## 2014-08-02 ENCOUNTER — Telehealth (HOSPITAL_COMMUNITY): Payer: Self-pay

## 2014-08-02 NOTE — Telephone Encounter (Signed)
Encounter complete. 

## 2014-08-03 ENCOUNTER — Other Ambulatory Visit: Payer: Self-pay | Admitting: Physician Assistant

## 2014-08-03 ENCOUNTER — Ambulatory Visit (HOSPITAL_COMMUNITY)
Admission: RE | Admit: 2014-08-03 | Discharge: 2014-08-03 | Disposition: A | Discharge status: Home / Self Care | Payer: BLUE CROSS/BLUE SHIELD | Source: Ambulatory Visit | Attending: Cardiology | Admitting: Cardiology

## 2014-08-03 DIAGNOSIS — R0789 Other chest pain: Secondary | ICD-10-CM | POA: Insufficient documentation

## 2014-08-03 NOTE — Procedures (Signed)
Exercise Treadmill Test   Test  Exercise Tolerance Test Ordering MD: Catha GosselinKevin Little, MD  Interpreting MD:   Unique Test No:1 Treadmill:  1  Indication for ETT: chest pain - rule out ischemia  Contraindication to ETT: No   Stress Modality: exercise - treadmill  Cardiac Imaging Performed: non   Protocol: standard Bruce - maximal  Max BP:  151/76  Max MPHR (bpm):  160 85% MPR (bpm): 136  MPHR obtained (bpm):  157 % MPHR obtained:  98  Reached 85% MPHR (min:sec): 6:50 Total Exercise Time (min-sec):  9:00  Workload in METS:  10.10 Borg Scale:   Reason ETT Terminated:  dyspnea    ST Segment Analysis At Rest: NSR, IRBBB With Exercise: no evidence of significant ST depression  Other Information Arrhythmia:  No Angina during ETT:  absent (0) Quality of ETT:  diagnostic  ETT Interpretation:  normal - no evidence of ischemia by ST analysis  Comments: ETT with good exercise tolerance (9:00); normal BP response; no chest pain; 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  Olga MillersBrian Taneka Espiritu

## 2015-08-09 ENCOUNTER — Inpatient Hospital Stay (HOSPITAL_BASED_OUTPATIENT_CLINIC_OR_DEPARTMENT_OTHER)
Admission: EM | Admit: 2015-08-09 | Discharge: 2015-08-17 | DRG: 327 | Disposition: A | Discharge status: Home / Self Care | Payer: BLUE CROSS/BLUE SHIELD | Attending: Surgery | Admitting: Surgery

## 2015-08-09 ENCOUNTER — Emergency Department (HOSPITAL_BASED_OUTPATIENT_CLINIC_OR_DEPARTMENT_OTHER): Payer: BLUE CROSS/BLUE SHIELD

## 2015-08-09 ENCOUNTER — Encounter (HOSPITAL_BASED_OUTPATIENT_CLINIC_OR_DEPARTMENT_OTHER): Payer: Self-pay

## 2015-08-09 DIAGNOSIS — E876 Hypokalemia: Secondary | ICD-10-CM | POA: Diagnosis present

## 2015-08-09 DIAGNOSIS — B3781 Candidal esophagitis: Secondary | ICD-10-CM | POA: Diagnosis present

## 2015-08-09 DIAGNOSIS — E538 Deficiency of other specified B group vitamins: Secondary | ICD-10-CM | POA: Diagnosis present

## 2015-08-09 DIAGNOSIS — K311 Adult hypertrophic pyloric stenosis: Principal | ICD-10-CM | POA: Diagnosis present

## 2015-08-09 DIAGNOSIS — Z883 Allergy status to other anti-infective agents status: Secondary | ICD-10-CM

## 2015-08-09 DIAGNOSIS — Z9889 Other specified postprocedural states: Secondary | ICD-10-CM

## 2015-08-09 DIAGNOSIS — K440 Diaphragmatic hernia with obstruction, without gangrene: Secondary | ICD-10-CM

## 2015-08-09 DIAGNOSIS — R1110 Vomiting, unspecified: Secondary | ICD-10-CM

## 2015-08-09 DIAGNOSIS — D509 Iron deficiency anemia, unspecified: Secondary | ICD-10-CM | POA: Diagnosis present

## 2015-08-09 DIAGNOSIS — I480 Paroxysmal atrial fibrillation: Secondary | ICD-10-CM | POA: Diagnosis present

## 2015-08-09 DIAGNOSIS — E7800 Pure hypercholesterolemia, unspecified: Secondary | ICD-10-CM | POA: Diagnosis present

## 2015-08-09 DIAGNOSIS — Z8711 Personal history of peptic ulcer disease: Secondary | ICD-10-CM | POA: Diagnosis not present

## 2015-08-09 DIAGNOSIS — K824 Cholesterolosis of gallbladder: Secondary | ICD-10-CM | POA: Diagnosis present

## 2015-08-09 DIAGNOSIS — K219 Gastro-esophageal reflux disease without esophagitis: Secondary | ICD-10-CM | POA: Diagnosis present

## 2015-08-09 DIAGNOSIS — R002 Palpitations: Secondary | ICD-10-CM | POA: Diagnosis not present

## 2015-08-09 DIAGNOSIS — Z79899 Other long term (current) drug therapy: Secondary | ICD-10-CM

## 2015-08-09 DIAGNOSIS — Z8249 Family history of ischemic heart disease and other diseases of the circulatory system: Secondary | ICD-10-CM | POA: Diagnosis not present

## 2015-08-09 DIAGNOSIS — J309 Allergic rhinitis, unspecified: Secondary | ICD-10-CM | POA: Diagnosis present

## 2015-08-09 DIAGNOSIS — Z7982 Long term (current) use of aspirin: Secondary | ICD-10-CM | POA: Diagnosis not present

## 2015-08-09 DIAGNOSIS — K449 Diaphragmatic hernia without obstruction or gangrene: Secondary | ICD-10-CM | POA: Diagnosis present

## 2015-08-09 DIAGNOSIS — I4891 Unspecified atrial fibrillation: Secondary | ICD-10-CM | POA: Diagnosis present

## 2015-08-09 HISTORY — DX: Gastro-esophageal reflux disease without esophagitis: K21.9

## 2015-08-09 HISTORY — DX: Chronic or unspecified gastric ulcer with hemorrhage: K25.4

## 2015-08-09 LAB — URINALYSIS, ROUTINE W REFLEX MICROSCOPIC
Bilirubin Urine: NEGATIVE
Glucose, UA: NEGATIVE mg/dL
Ketones, ur: 40 mg/dL — AB
Leukocytes, UA: NEGATIVE
Nitrite: NEGATIVE
Protein, ur: NEGATIVE mg/dL
SPECIFIC GRAVITY, URINE: 1.017 (ref 1.005–1.030)
pH: 6.5 (ref 5.0–8.0)

## 2015-08-09 LAB — HEPATIC FUNCTION PANEL
ALT: 13 U/L — AB (ref 14–54)
AST: 24 U/L (ref 15–41)
Albumin: 4.2 g/dL (ref 3.5–5.0)
Alkaline Phosphatase: 73 U/L (ref 38–126)
BILIRUBIN TOTAL: 0.6 mg/dL (ref 0.3–1.2)
Bilirubin, Direct: 0.1 mg/dL (ref 0.1–0.5)
Indirect Bilirubin: 0.5 mg/dL (ref 0.3–0.9)
TOTAL PROTEIN: 7.2 g/dL (ref 6.5–8.1)

## 2015-08-09 LAB — BASIC METABOLIC PANEL
Anion gap: 10 (ref 5–15)
BUN: 12 mg/dL (ref 6–20)
CHLORIDE: 105 mmol/L (ref 101–111)
CO2: 26 mmol/L (ref 22–32)
CREATININE: 0.61 mg/dL (ref 0.44–1.00)
Calcium: 9.3 mg/dL (ref 8.9–10.3)
GFR calc Af Amer: >60 mL/min (ref >60)
GFR calc non Af Amer: >60 mL/min (ref >60)
GLUCOSE: 109 mg/dL — AB (ref 65–99)
POTASSIUM: 3.1 mmol/L — AB (ref 3.5–5.1)
Sodium: 141 mmol/L (ref 135–145)

## 2015-08-09 LAB — CBC
HEMATOCRIT: 39.3 % (ref 36.0–46.0)
Hemoglobin: 12.7 g/dL (ref 12.0–15.0)
MCH: 30.3 pg (ref 26.0–34.0)
MCHC: 32.3 g/dL (ref 30.0–36.0)
MCV: 93.8 fL (ref 78.0–100.0)
PLATELETS: 268 10*3/uL (ref 150–400)
RBC: 4.19 MIL/uL (ref 3.87–5.11)
RDW: 12.2 % (ref 11.5–15.5)
WBC: 7.1 10*3/uL (ref 4.0–10.5)

## 2015-08-09 LAB — URINE MICROSCOPIC-ADD ON: WBC, UA: NONE SEEN WBC/hpf (ref 0–5)

## 2015-08-09 LAB — LIPASE, BLOOD: Lipase: 39 U/L (ref 11–51)

## 2015-08-09 LAB — TROPONIN I: Troponin I: <0.03 ng/mL (ref <0.031)

## 2015-08-09 MED ORDER — IOHEXOL 300 MG/ML  SOLN
100.0000 mL | Freq: Once | INTRAMUSCULAR | Status: AC | PRN
  Administered 2015-08-09: 100 mL via INTRAVENOUS

## 2015-08-09 MED ORDER — ONDANSETRON HCL 4 MG/2ML IJ SOLN
4.0000 mg | Freq: Once | INTRAMUSCULAR | Status: AC
  Administered 2015-08-09: 4 mg via INTRAVENOUS
  Filled 2015-08-09: qty 2

## 2015-08-09 MED ORDER — PANTOPRAZOLE SODIUM 40 MG IV SOLR
40.0000 mg | Freq: Once | INTRAVENOUS | Status: AC
  Administered 2015-08-09: 40 mg via INTRAVENOUS
  Filled 2015-08-09: qty 40

## 2015-08-09 MED ORDER — IOHEXOL 300 MG/ML  SOLN
50.0000 mL | Freq: Once | INTRAMUSCULAR | Status: AC | PRN
  Administered 2015-08-09: 50 mL via ORAL

## 2015-08-09 MED ORDER — METOCLOPRAMIDE HCL 5 MG/ML IJ SOLN
10.0000 mg | Freq: Once | INTRAMUSCULAR | Status: AC
  Administered 2015-08-09: 10 mg via INTRAVENOUS
  Filled 2015-08-09: qty 2

## 2015-08-09 MED ORDER — HYDROMORPHONE HCL 1 MG/ML IJ SOLN
0.5000 mg | Freq: Once | INTRAMUSCULAR | Status: AC
Start: 2015-08-09 — End: 2015-08-09
  Administered 2015-08-09: 0.5 mg via INTRAVENOUS
  Filled 2015-08-09: qty 1

## 2015-08-09 MED ORDER — SODIUM CHLORIDE 0.9 % IV SOLN
Freq: Once | INTRAVENOUS | Status: AC
  Administered 2015-08-10: 02:00:00 via INTRAVENOUS

## 2015-08-09 MED ORDER — SODIUM CHLORIDE 0.9 % IV SOLN
Freq: Once | INTRAVENOUS | Status: AC
  Administered 2015-08-09: 17:00:00 via INTRAVENOUS

## 2015-08-09 MED ORDER — SODIUM CHLORIDE 0.9 % IV BOLUS (SEPSIS)
500.0000 mL | Freq: Once | INTRAVENOUS | Status: AC
  Administered 2015-08-09: 500 mL via INTRAVENOUS

## 2015-08-09 NOTE — ED Notes (Signed)
Patient back from u/s.

## 2015-08-09 NOTE — ED Provider Notes (Signed)
CSN: [id number]     Arrival date & time 08/09/15  1534 History   First MD Initiated Contact with Patient 08/09/15 1558     Chief Complaint  Patient presents with  . Chest Pain     (Consider location/radiation/quality/duration/timing/severity/associated sxs/prior Treatment) HPI Comments: 60 year old female presents for epigastric pain. The patient reports that she felt fine earlier in the day. She reports that she had gone out to lunch and had just taken a few bites of her sandwich when she had the acute onset of nausea, vomiting, epigastric pain. She reports this initially felt like her reflux. She said she tried taking an antacid at the restaurant as well as omeprazole at home without any improvement. She denies shortness of breath or chest pain. She denies radiation of the pain into her back. No lower abdominal pain. Denies constipation or diarrhea. No fevers or chills.  Patient is a 60 y.o. female presenting with chest pain.  Chest Pain Associated symptoms: abdominal pain, nausea and vomiting   Associated symptoms: no back pain, no cough, no dizziness, no fatigue, no fever, no headache, no palpitations, no shortness of breath and no weakness     Past Medical History  Diagnosis Date  . Deviated nasal septum   . Colon polyp 2008    on last colonoscopy with Dr. Loreta Ave in 2008  . Iron deficiency anemia   . Hypercholesteremia   . Osteoarthritis   . Allergic rhinitis   . Vitamin B12 deficiency   . Diverticulosis 2008    on last colonoscopy with Dr. Loreta Ave in 2008  . Atrial fibrillation (HCC) 08/22/2013  . GERD (gastroesophageal reflux disease)    Past Surgical History  Procedure Laterality Date  . Tonsillectomy    . Cesarean section    . Nasal sinus surgery     Family History  Problem Relation Age of Onset  . Hypertension Mother   . Hepatitis Mother   . Heart attack Father   . Hydrocephalus Father   . Cancer Maternal Aunt 23    breast cancer   Social History  Substance Use  Topics  . Smoking status: Never Smoker   . Smokeless tobacco: None  . Alcohol Use: Yes     Comment: weekly   OB History    Gravida Para Term Preterm AB TAB SAB Ectopic Multiple Living   Review of Systems  Constitutional: Negative for fever, chills and fatigue.  HENT: Negative for congestion and rhinorrhea.   Respiratory: Negative for cough, chest tightness and shortness of breath.   Cardiovascular: Negative for chest pain, palpitations and leg swelling.  Gastrointestinal: Positive for nausea, vomiting and abdominal pain. Negative for diarrhea, constipation and blood in stool.  Genitourinary: Negative for urgency, hematuria and flank pain.  Musculoskeletal: Negative for myalgias and back pain.  Skin: Negative for rash.  Neurological: Negative for dizziness, weakness and headaches.  Hematological: Does not bruise/bleed easily.      Allergies  Bactrim  Home Medications   Prior to Admission medications   Medication Sig Start Date End Date Taking? Authorizing Provider  Omega-3 Fatty Acids (FISH OIL PO) Take by mouth.   Yes Historical Provider, MD  acetaminophen (TYLENOL) 500 MG tablet Take 1,000 mg by mouth every 6 (six) hours as needed for pain.    Historical Provider, MD  aspirin 81 MG tablet Take 81 mg by mouth daily.    Historical Provider, MD  ferrous sulfate  325 (65 FE) MG tablet Take 1 tablet (325 mg total) by mouth 2 (two) times daily with a meal. 02/27/12 02/26/13  Rhetta Mura, MD  fexofenadine-pseudoephedrine (ALLEGRA-D) 60-120 MG per tablet Take 1 tablet by mouth daily as needed (for allergies). Patient is using this medication for allergies.    Historical Provider, MD  fluticasone (FLONASE) 50 MCG/ACT nasal spray Place 2 sprays into the nose daily as needed for allergies.     Historical Provider, MD  omeprazole (PRILOSEC) 20 MG capsule TAKE ONE CAPSULE BY MOUTH ONCE DAILY 07/31/14   Corky Crafts, MD   BP 124/80 mmHg  Pulse 90  Temp(Src)  97.7 F (36.5 C) (Oral)  Resp 16  Ht  (1.626 m)  Wt 165 lb (74.844 kg)  BMI 28.31 kg/m2  SpO2 96% Physical Exam  Constitutional: She is oriented to person, place, and time. She appears well-developed and well-nourished. No distress.  HENT:  Head: Normocephalic and atraumatic.  Right Ear: External ear normal.  Left Ear: External ear normal.  Nose: Nose normal.  Mouth/Throat: Oropharynx is clear and moist. No oropharyngeal exudate.  Eyes: EOM are normal. Pupils are equal, round, and reactive to light.  Neck: Normal range of motion. Neck supple.  Cardiovascular: Normal rate, regular rhythm, normal heart sounds and intact distal pulses.   No murmur heard. Pulmonary/Chest: Effort normal. No respiratory distress. She has no wheezes. She has no rales.  Abdominal: Soft. She exhibits no distension and no mass. There is tenderness (moderate) in the right upper quadrant and epigastric area. There is no rebound and no guarding.  Musculoskeletal: Normal range of motion. She exhibits no edema or tenderness.  Neurological: She is alert and oriented to person, place, and time.  Skin: Skin is warm and dry. No rash noted. She is not diaphoretic.  Vitals reviewed.   ED Course  Procedures (including critical care time) Labs Review Labs Reviewed  BASIC METABOLIC PANEL - Abnormal; Notable for the following:    Potassium 3.1 (*)    Glucose, Bld 109 (*)    All other components within normal limits  URINALYSIS, ROUTINE W REFLEX MICROSCOPIC (NOT AT [facility]) - Abnormal; Notable for the following:    Hgb urine dipstick MODERATE (*)    Ketones, ur 40 (*)    All other components within normal limits  HEPATIC FUNCTION PANEL - Abnormal; Notable for the following:    ALT 13 (*)    All other components within normal limits  URINE MICROSCOPIC-ADD ON - Abnormal; Notable for the following:    Squamous Epithelial / LPF 0-5 (*)    Bacteria, UA RARE (*)    All other components within normal limits  CBC   TROPONIN I  LIPASE, BLOOD    Imaging Review Dg Chest 2 View  08/09/2015  CLINICAL DATA:  Mid to left chest pain. EXAM: CHEST  2 VIEW COMPARISON:  07/28/2014. FINDINGS: Increased size of a large hiatal hernia containing an air-fluid level. Interval small amount of linear density at the left lung base. Otherwise, clear lungs. Unremarkable bones. IMPRESSION: 1. Increased size of a large hiatal hernia containing an air-fluid level. 2. Interval mild linear atelectasis or scarring at the left lung base. Electronically Signed   By: Beckie Salts M.D.   On: 08/09/2015 16:05   Ct Abdomen Pelvis W Contrast  08/09/2015  CLINICAL DATA:  Acute generalized abdominal pain. EXAM: CT ABDOMEN AND PELVIS WITH CONTRAST TECHNIQUE: Multidetector CT imaging of the abdomen and pelvis was performed using the  standard protocol following bolus administration of intravenous contrast. CONTRAST:  OMNIPAQUE IOHEXOL 300 MG/ML  SOLN COMPARISON:  None. FINDINGS: Severe degenerative disc disease is noted at L1-2. Visualized lung bases are unremarkable. Large paraesophageal hiatal hernia is noted resulting in dilatation and possible obstruction of the more proximal stomach. Small solitary gallstone is noted. No significant abnormality is noted in the liver, spleen or pancreas. Adrenal glands are unremarkable. Parapelvic cysts are noted in left kidney. Otherwise kidneys appear normal. No hydronephrosis or renal obstruction is noted. The appendix appears normal. There is no evidence of large or small bowel obstruction. No abnormal fluid collection is noted. Sigmoid diverticulosis is noted without inflammation. Urinary bladder appears normal. Uterus and ovaries are unremarkable. No significant adenopathy is noted. IMPRESSION: Large paraesophageal hiatal hernia is noted which results in dilatation and probable obstruction of the more proximal stomach. Sigmoid diverticulosis is noted without inflammation. Small solitary gallstone is noted.  Electronically Signed   By: Lupita Raider, M.D.   On: 08/09/2015 21:25   US Abdomen Limited  08/09/2015  CLINICAL DATA:  60 year old female with acute right upper quadrant abdominal pain. EXAM: US ABDOMEN LIMITED - RIGHT UPPER QUADRANT COMPARISON:  None. FINDINGS: Gallbladder: A 6 x 9 x 9 mm nonmobile oval hypoechoic structure/polyp along the gallbladder wall is identified. At least 1 area along ring down artifact from the gallbladder wall is noted compatible with adenomyomatosis. There is no evidence of cholelithiasis or acute cholecystitis. Common bile duct: Diameter: 3 mm. Liver: No focal lesion identified. Within normal limits in parenchymal echogenicity. IMPRESSION: 6 x 9 x 9 mm gallbladder polyp. Ultrasound follow-up is recommended in 1 year. This recommendation follows ACR consensus guidelines: White Paper of the ACR Incidental Findings Committee II on Gallbladder and Biliary Findings. J Am Coll Radiol 2013:;10:953-956. Gallbladder adenomyomatosis. No other significant abnormalities. No evidence of cholelithiasis or acute cholecystitis. Electronically Signed   By: Harmon Pier M.D.   On: 08/09/2015 18:18   I have personally reviewed and evaluated these images and lab results as part of my medical decision-making.   EKG Interpretation   Date/Time:  Thursday August 09 2015 15:44:18 EST Ventricular Rate:  74 PR Interval:  160 QRS Duration: 104 QT Interval:  414 QTC Calculation: 459 R Axis:   -21 Text Interpretation:  Normal sinus rhythm with sinus arrhythmia Incomplete  right bundle branch block Borderline ECG No significant change since last  tracing Confirmed by NGUYEN, EMILY ([id number]) on 08/09/2015 5:06:48 PM      MDM  Patient was seen and evaluated in stable condition. Concern on initial examination for possible gallbladder disease versus ulcer. Patient was given Zofran, Protonix, Dilaudid. Chest x-ray showed gastric distention with air-fluid level and significant hiatal hernia. CT  again showed the same with likely gastric outlet obstruction. Laboratory results were unremarkable. Abdominal ultrasound was unremarkable other than a gallbladder polyp which was discussed with the patient and family at bedside. Case was discussed with Dr. Maisie Fus from surgery who agreed with plan for NG tube placement and admission. She said that Gen. surgery would following consultation and requested admission to medicine. Dr. Maisie Fus said that she did not have a preference on admission to [facility]. Case was discussed with Dr. Maryfrances Bunnell at [facility] who agreed with admission. Patient was admitted under his care with surgery to following consultation.  Final diagnoses:  Gastric outflow obstruction    1. Acute epigastric pain  2. Gastric outlet obstruction    Leta Baptist, MD  08/09/15 2343 

## 2015-08-09 NOTE — ED Notes (Signed)
Patient reports no relief from nausea after zofran administration.

## 2015-08-09 NOTE — ED Notes (Signed)
Per MD okay for patient to have CT without PO contrast d/t patient unable to tolerate.

## 2015-08-09 NOTE — Progress Notes (Signed)
60 yo F p/w abdominal pain sudden after lunch.  CT abd, CXR, and RUQ US done.  Appears to have gastric outlet obstruction possibly from hiatal hernia.  NG tube placed.  Spoke with Dr. Maisie Fus from Gen Surg.    To med surg for MIVF and symptom control.  Can call Gen Surg when she arrives, and they will see tonight, perhaps.

## 2015-08-09 NOTE — ED Notes (Signed)
CP and abd pain started approx 2pm today while at lunch

## 2015-08-10 ENCOUNTER — Observation Stay (HOSPITAL_COMMUNITY): Payer: BLUE CROSS/BLUE SHIELD | Admitting: Certified Registered"

## 2015-08-10 ENCOUNTER — Encounter (HOSPITAL_COMMUNITY): Admission: EM | Disposition: A | Discharge status: Home / Self Care | Payer: Self-pay | Source: Home / Self Care

## 2015-08-10 ENCOUNTER — Encounter (HOSPITAL_COMMUNITY): Payer: Self-pay | Admitting: Family Medicine

## 2015-08-10 DIAGNOSIS — K311 Adult hypertrophic pyloric stenosis: Secondary | ICD-10-CM | POA: Diagnosis present

## 2015-08-10 HISTORY — PX: ESOPHAGOGASTRODUODENOSCOPY: SHX5428

## 2015-08-10 LAB — COMPREHENSIVE METABOLIC PANEL
ALT: 12 U/L — AB (ref 14–54)
AST: 21 U/L (ref 15–41)
Albumin: 3.6 g/dL (ref 3.5–5.0)
Alkaline Phosphatase: 69 U/L (ref 38–126)
Anion gap: 11 (ref 5–15)
BUN: 7 mg/dL (ref 6–20)
CHLORIDE: 104 mmol/L (ref 101–111)
CO2: 25 mmol/L (ref 22–32)
CREATININE: 0.63 mg/dL (ref 0.44–1.00)
Calcium: 9.2 mg/dL (ref 8.9–10.3)
GFR calc non Af Amer: >60 mL/min (ref >60)
Glucose, Bld: 140 mg/dL — ABNORMAL HIGH (ref 65–99)
Potassium: 3.3 mmol/L — ABNORMAL LOW (ref 3.5–5.1)
SODIUM: 140 mmol/L (ref 135–145)
Total Bilirubin: 1.1 mg/dL (ref 0.3–1.2)
Total Protein: 5.9 g/dL — ABNORMAL LOW (ref 6.5–8.1)

## 2015-08-10 LAB — CBC
HCT: 37.9 % (ref 36.0–46.0)
Hemoglobin: 12 g/dL (ref 12.0–15.0)
MCH: 29.9 pg (ref 26.0–34.0)
MCHC: 31.7 g/dL (ref 30.0–36.0)
MCV: 94.5 fL (ref 78.0–100.0)
PLATELETS: 269 10*3/uL (ref 150–400)
RBC: 4.01 MIL/uL (ref 3.87–5.11)
RDW: 12.6 % (ref 11.5–15.5)
WBC: 10.7 10*3/uL — ABNORMAL HIGH (ref 4.0–10.5)

## 2015-08-10 SURGERY — EGD (ESOPHAGOGASTRODUODENOSCOPY)
Anesthesia: General

## 2015-08-10 MED ORDER — SODIUM CHLORIDE 0.9 % IV SOLN: INTRAVENOUS | Status: DC

## 2015-08-10 MED ORDER — ENOXAPARIN SODIUM 40 MG/0.4ML ~~LOC~~ SOLN
40.0000 mg | Freq: Every day | SUBCUTANEOUS | Status: DC
  Administered 2015-08-10 – 2015-08-14 (×4): 40 mg via SUBCUTANEOUS
  Filled 2015-08-10 (×5): qty 0.4

## 2015-08-10 MED ORDER — FENTANYL CITRATE (PF) 100 MCG/2ML IJ SOLN: 25.0000 ug | INTRAMUSCULAR | Status: DC | PRN

## 2015-08-10 MED ORDER — HYDROMORPHONE HCL 1 MG/ML IJ SOLN
0.5000 mg | INTRAMUSCULAR | Status: DC | PRN
  Administered 2015-08-10 – 2015-08-11 (×2): 0.5 mg via INTRAVENOUS
  Filled 2015-08-10 (×2): qty 1

## 2015-08-10 MED ORDER — KCL IN DEXTROSE-NACL 20-5-0.45 MEQ/L-%-% IV SOLN
INTRAVENOUS | Status: AC
  Administered 2015-08-10 – 2015-08-13 (×5): via INTRAVENOUS
  Filled 2015-08-10 (×8): qty 1000

## 2015-08-10 MED ORDER — BUTAMBEN-TETRACAINE-BENZOCAINE 2-2-14 % EX AERO
INHALATION_SPRAY | CUTANEOUS | Status: DC | PRN
  Administered 2015-08-10: 2 via TOPICAL

## 2015-08-10 MED ORDER — PROPOFOL 500 MG/50ML IV EMUL
INTRAVENOUS | Status: DC | PRN
  Administered 2015-08-10: 150 ug/kg/min via INTRAVENOUS

## 2015-08-10 MED ORDER — OXYCODONE HCL 5 MG/5ML PO SOLN: 5.0000 mg | Freq: Once | ORAL | Status: DC | PRN

## 2015-08-10 MED ORDER — OXYCODONE HCL 5 MG PO TABS: 5.0000 mg | ORAL_TABLET | Freq: Once | ORAL | Status: DC | PRN

## 2015-08-10 MED ORDER — LACTATED RINGERS IV SOLN
INTRAVENOUS | Status: DC | PRN
Start: 2015-08-10 — End: 2015-08-10
  Administered 2015-08-10: 12:00:00 via INTRAVENOUS

## 2015-08-10 MED ORDER — PROPOFOL 10 MG/ML IV BOLUS
INTRAVENOUS | Status: DC | PRN
  Administered 2015-08-10 (×3): 10 mg via INTRAVENOUS

## 2015-08-10 MED ORDER — ONDANSETRON HCL 4 MG/2ML IJ SOLN: 4.0000 mg | Freq: Four times a day (QID) | INTRAMUSCULAR | Status: DC | PRN

## 2015-08-10 MED ORDER — ACETAMINOPHEN 325 MG PO TABS
650.0000 mg | ORAL_TABLET | Freq: Four times a day (QID) | ORAL | Status: DC | PRN
  Administered 2015-08-10 (×2): 650 mg via ORAL
  Filled 2015-08-10 (×2): qty 2

## 2015-08-10 MED ORDER — ONDANSETRON HCL 4 MG PO TABS: 4.0000 mg | ORAL_TABLET | Freq: Four times a day (QID) | ORAL | Status: DC | PRN

## 2015-08-10 MED ORDER — ACETAMINOPHEN 650 MG RE SUPP: 650.0000 mg | Freq: Four times a day (QID) | RECTAL | Status: DC | PRN

## 2015-08-10 MED ORDER — LACTATED RINGERS IV SOLN
INTRAVENOUS | Status: DC
  Administered 2015-08-10: 1000 mL via INTRAVENOUS
  Administered 2015-08-15 (×2): via INTRAVENOUS

## 2015-08-10 NOTE — Op Note (Signed)
[facility] [address] [place], [address]   ENDOSCOPY PROCEDURE REPORT  PATIENT: Jenna [name], Jenna [name]  MR#: [id number] BIRTHDATE: 26-Apr-1956 , 59  yrs. old GENDER: female ENDOSCOPIST:Mikele Sifuentes Elnoria Howard, MD REFERRED BY: PROCEDURE DATE:  2015/08/28 PROCEDURE:   EGD, diagnostic ASA CLASS:    Class II INDICATIONS: Paraesophageal hiatal hernia MEDICATION: Monitored anesthesia care TOPICAL ANESTHETIC:   none  DESCRIPTION OF PROCEDURE:   After the risks and benefits of the procedure were explained, informed consent was obtained.  The Pentax Gastroscope [id number]  endoscope was introduced through the mouth  and advanced to the second portion of the duodenum .  The instrument was slowly withdrawn as the mucosa was fully examined. Estimated blood loss is zero unless otherwise noted in this procedure report.   FINDINGS:  In the mid to proximal esophagus a mild candidal esophagitis was identified.  The Z-line was sharp.  A large paraesophageal hiatal hernia was found.  The NG tube was removed as it was coiled in the hernia.  I was able to suction out a large majority of the retained food contents.  There was no evidence of vascular compromise.  The distal stomach was normal.  No duodenal abnormalities found.    Retroflexed views revealed a hiatal hernia. The scope was then withdrawn from the patient and the procedure completed.  COMPLICATIONS: There were no immediate complications.  ENDOSCOPIC IMPRESSION: 1) Large paraesophageal hiatal hernia.  Unable to reduce endoscopically. 2) Mild Candidal esophagitis.  RECOMMENDATIONS: 1) Surgical correction of the hernia. 2) Hold on treatment for the Candidiasis as the pills will not make it out of the stomach. _______________________________ eSigned:  Jeani Hawking, MD 28-Aug-2015 12:05 PM    cc: CPT CODES: ICD CODES:

## 2015-08-10 NOTE — Transfer of Care (Signed)
Immediate Anesthesia Transfer of Care Note  Patient: Jenna [name]  Procedure(s) Performed: Procedure(s): ESOPHAGOGASTRODUODENOSCOPY (EGD) (N/A)  Patient Location: Endoscopy Unit  Anesthesia Type:MAC  Level of Consciousness: awake, alert  and oriented  Airway & Oxygen Therapy: Patient Spontanous Breathing and Patient connected to nasal cannula oxygen  Post-op Assessment: Report given to RN, Post -op Vital signs reviewed and stable and Patient moving all extremities X 4  Post vital signs: Reviewed and stable  Last Vitals:  Filed Vitals:   08/10/15 0555 08/10/15 1053  BP: 155/84 129/87  Pulse: 101 90  Temp: 36.8 C 36.8 C  Resp: 18 16    Complications: No apparent anesthesia complications

## 2015-08-10 NOTE — Anesthesia Preprocedure Evaluation (Signed)
Anesthesia Evaluation  Patient identified by MRN, date of birth, ID band Patient awake    Reviewed: Allergy & Precautions, NPO status , Patient's Chart, lab work & pertinent test results  Airway Mallampati: II   Neck ROM: full    Dental   Pulmonary neg pulmonary ROS,    breath sounds clear to auscultation       Cardiovascular negative cardio ROS  + dysrhythmias Atrial Fibrillation  Rhythm:regular Rate:Normal     Neuro/Psych    GI/Hepatic PUD, GERD  ,  Endo/Other    Renal/GU      Musculoskeletal  (+) Arthritis ,   Abdominal   Peds  Hematology   Anesthesia Other Findings   Reproductive/Obstetrics                             Anesthesia Physical Anesthesia Plan  ASA: III  Anesthesia Plan: General   Post-op Pain Management:    Induction: Intravenous  Airway Management Planned: Nasal Cannula  Additional Equipment:   Intra-op Plan:   Post-operative Plan:   Informed Consent: I have reviewed the patients History and Physical, chart, labs and discussed the procedure including the risks, benefits and alternatives for the proposed anesthesia with the patient or authorized representative who has indicated his/her understanding and acceptance.     Plan Discussed with: CRNA, Anesthesiologist and Surgeon  Anesthesia Plan Comments:         Anesthesia Quick Evaluation

## 2015-08-10 NOTE — Consult Note (Signed)
Reason for Consult: Gastric outlet obstruction Referring Physician: [facility]  Charm Barges Anglemyer HPI: This is a 60 year old female with a PMH of gastric erosions, IDA, and colonic polyp who is admitted with acute chest pain and vomiting.  She was eating lunch yesterday when she started with the symptoms.  Further evaluation with a CT scan revealed a large paraesophageal hiatal hernia and a proximal gastric outlet obstruction.  An NG tube was placed and it improved her symptoms.  Teh patient denies any prior symptoms such as these admission symptoms.  In the past she was evaluated by Dr. Loreta Ave with an EGD/Colonoscopy for IDA and the EGD was revealing for antral erosions.  No findings of a hiatal hernia.  GI is consulted to possibly reduce the hernia with the EGD.  Past Medical History  Diagnosis Date  . Deviated nasal septum   . Colon polyp 2008    on last colonoscopy with Dr. Loreta Ave in 2008  . Hypercholesteremia   . Osteoarthritis   . Allergic rhinitis   . Vitamin B12 deficiency   . Diverticulosis 2008    on last colonoscopy with Dr. Loreta Ave in 2008  . Atrial fibrillation (HCC) 08/22/2013  . GERD (gastroesophageal reflux disease)   . Gastric ulcer with hemorrhage   . Iron deficiency anemia     After ulcer in 2013, resolved    Past Surgical History  Procedure Laterality Date  . Tonsillectomy    . Cesarean section    . Nasal sinus surgery      Family History  Problem Relation Age of Onset  . Hypertension Mother   . Hepatitis Mother   . Heart attack Father   . Hydrocephalus Father   . Cancer Maternal Aunt 63    breast cancer    Social History:  reports that she has never smoked. She does not have any smokeless tobacco history on file. She reports that she drinks alcohol. She reports that she does not use illicit drugs.  Allergies:  Allergies  Allergen Reactions  . Bactrim [Sulfamethoxazole-Trimethoprim] Other (See Comments)    Canker sores in her mouth.  . Adhesive [Tape]  Rash and Other (See Comments)    Burns to the skin  . Latex Rash and Other (See Comments)    Burns to skin    Medications:  Scheduled: . [MAR Hold] enoxaparin (LOVENOX) injection  40 mg Subcutaneous Daily   Continuous: . sodium chloride    . dextrose 5 % and 0.45 % NaCl with KCl 20 mEq/L 125 mL/hr at 08/10/15 0217  . lactated ringers 1,000 mL (08/10/15 1055)    Results for orders placed or performed during the hospital encounter of 08/09/15 (from the past 24 hour(s))  Basic metabolic panel     Status: Abnormal   Collection Time: 08/09/15  3:50 PM  Result Value Ref Range   Sodium 141 135 - 145 mmol/L   Potassium 3.1 (L) 3.5 - 5.1 mmol/L   Chloride 105 101 - 111 mmol/L   CO2 26 22 - 32 mmol/L   Glucose, Bld 109 (H) 65 - 99 mg/dL   BUN 12 6 - 20 mg/dL   Creatinine, Ser 1.61 0.44 - 1.00 mg/dL   Calcium 9.3 8.9 - 09.6 mg/dL   GFR calc non Af Amer >60 >60 mL/min   GFR calc Af Amer >60 >60 mL/min   Anion gap 10 5 - 15  CBC     Status: None   Collection Time: 08/09/15  3:50 PM  Result Value Ref Range   WBC 7.1 4.0 - 10.5 K/uL   RBC 4.19 3.87 - 5.11 MIL/uL   Hemoglobin 12.7 12.0 - 15.0 g/dL   HCT 16.1 09.6 - 04.5 %   MCV 93.8 78.0 - 100.0 fL   MCH 30.3 26.0 - 34.0 pg   MCHC 32.3 30.0 - 36.0 g/dL   RDW 40.9 81.1 - 91.4 %   Platelets 268 150 - 400 K/uL  Troponin I     Status: None   Collection Time: 08/09/15  3:50 PM  Result Value Ref Range   Troponin I <0.03 <0.031 ng/mL  Lipase, blood     Status: None   Collection Time: 08/09/15  3:50 PM  Result Value Ref Range   Lipase 39 11 - 51 U/L  Hepatic function panel     Status: Abnormal   Collection Time: 08/09/15  3:50 PM  Result Value Ref Range   Total Protein 7.2 6.5 - 8.1 g/dL   Albumin 4.2 3.5 - 5.0 g/dL   AST 24 15 - 41 U/L   ALT 13 (L) 14 - 54 U/L   Alkaline Phosphatase 73 38 - 126 U/L   Total Bilirubin 0.6 0.3 - 1.2 mg/dL   Bilirubin, Direct 0.1 0.1 - 0.5 mg/dL   Indirect Bilirubin 0.5 0.3 - 0.9 mg/dL  Urinalysis,  Routine w reflex microscopic (not at [facility])     Status: Abnormal   Collection Time: 08/09/15  7:50 PM  Result Value Ref Range   Color, Urine YELLOW YELLOW   APPearance CLEAR CLEAR   Specific Gravity, Urine 1.017 1.005 - 1.030   pH 6.5 5.0 - 8.0   Glucose, UA NEGATIVE NEGATIVE mg/dL   Hgb urine dipstick MODERATE (A) NEGATIVE   Bilirubin Urine NEGATIVE NEGATIVE   Ketones, ur 40 (A) NEGATIVE mg/dL   Protein, ur NEGATIVE NEGATIVE mg/dL   Nitrite NEGATIVE NEGATIVE   Leukocytes, UA NEGATIVE NEGATIVE  Urine microscopic-add on     Status: Abnormal   Collection Time: 08/09/15  7:50 PM  Result Value Ref Range   Squamous Epithelial / LPF 0-5 (A) NONE SEEN   WBC, UA NONE SEEN 0 - 5 WBC/hpf   RBC / HPF 6-30 0 - 5 RBC/hpf   Bacteria, UA RARE (A) NONE SEEN   Urine-Other MUCOUS PRESENT   Comprehensive metabolic panel     Status: Abnormal   Collection Time: 08/10/15  5:00 AM  Result Value Ref Range   Sodium 140 135 - 145 mmol/L   Potassium 3.3 (L) 3.5 - 5.1 mmol/L   Chloride 104 101 - 111 mmol/L   CO2 25 22 - 32 mmol/L   Glucose, Bld 140 (H) 65 - 99 mg/dL   BUN 7 6 - 20 mg/dL   Creatinine, Ser 7.82 0.44 - 1.00 mg/dL   Calcium 9.2 8.9 - 95.6 mg/dL   Total Protein 5.9 (L) 6.5 - 8.1 g/dL   Albumin 3.6 3.5 - 5.0 g/dL   AST 21 15 - 41 U/L   ALT 12 (L) 14 - 54 U/L   Alkaline Phosphatase 69 38 - 126 U/L   Total Bilirubin 1.1 0.3 - 1.2 mg/dL   GFR calc non Af Amer >60 >60 mL/min   GFR calc Af Amer >60 >60 mL/min   Anion gap 11 5 - 15  CBC     Status: Abnormal   Collection Time: 08/10/15  5:00 AM  Result Value Ref Range   WBC 10.7 (H) 4.0 - 10.5 K/uL  RBC 4.01 3.87 - 5.11 MIL/uL   Hemoglobin 12.0 12.0 - 15.0 g/dL   HCT 91.4 78.2 - 95.6 %   MCV 94.5 78.0 - 100.0 fL   MCH 29.9 26.0 - 34.0 pg   MCHC 31.7 30.0 - 36.0 g/dL   RDW 21.3 08.6 - 57.8 %   Platelets 269 150 - 400 K/uL     Dg Chest 2 View  08/09/2015  CLINICAL DATA:  Mid to left chest pain. EXAM: CHEST  2 VIEW COMPARISON:   07/28/2014. FINDINGS: Increased size of a large hiatal hernia containing an air-fluid level. Interval small amount of linear density at the left lung base. Otherwise, clear lungs. Unremarkable bones. IMPRESSION: 1. Increased size of a large hiatal hernia containing an air-fluid level. 2. Interval mild linear atelectasis or scarring at the left lung base. Electronically Signed   By: Beckie Salts M.D.   On: 08/09/2015 16:05   Ct Abdomen Pelvis W Contrast  08/09/2015  CLINICAL DATA:  Acute generalized abdominal pain. EXAM: CT ABDOMEN AND PELVIS WITH CONTRAST TECHNIQUE: Multidetector CT imaging of the abdomen and pelvis was performed using the standard protocol following bolus administration of intravenous contrast. CONTRAST:  OMNIPAQUE IOHEXOL 300 MG/ML  SOLN COMPARISON:  None. FINDINGS: Severe degenerative disc disease is noted at L1-2. Visualized lung bases are unremarkable. Large paraesophageal hiatal hernia is noted resulting in dilatation and possible obstruction of the more proximal stomach. Small solitary gallstone is noted. No significant abnormality is noted in the liver, spleen or pancreas. Adrenal glands are unremarkable. Parapelvic cysts are noted in left kidney. Otherwise kidneys appear normal. No hydronephrosis or renal obstruction is noted. The appendix appears normal. There is no evidence of large or small bowel obstruction. No abnormal fluid collection is noted. Sigmoid diverticulosis is noted without inflammation. Urinary bladder appears normal. Uterus and ovaries are unremarkable. No significant adenopathy is noted. IMPRESSION: Large paraesophageal hiatal hernia is noted which results in dilatation and probable obstruction of the more proximal stomach. Sigmoid diverticulosis is noted without inflammation. Small solitary gallstone is noted. Electronically Signed   By: Lupita Raider, M.D.   On: 08/09/2015 21:25   US Abdomen Limited  08/09/2015  CLINICAL DATA:  60 year old female with  acute right upper quadrant abdominal pain. EXAM: US ABDOMEN LIMITED - RIGHT UPPER QUADRANT COMPARISON:  None. FINDINGS: Gallbladder: A 6 x 9 x 9 mm nonmobile oval hypoechoic structure/polyp along the gallbladder wall is identified. At least 1 area along ring down artifact from the gallbladder wall is noted compatible with adenomyomatosis. There is no evidence of cholelithiasis or acute cholecystitis. Common bile duct: Diameter: 3 mm. Liver: No focal lesion identified. Within normal limits in parenchymal echogenicity. IMPRESSION: 6 x 9 x 9 mm gallbladder polyp. Ultrasound follow-up is recommended in 1 year. This recommendation follows ACR consensus guidelines: White Paper of the ACR Incidental Findings Committee II on Gallbladder and Biliary Findings. J Am Coll Radiol 2013:;10:953-956. Gallbladder adenomyomatosis. No other significant abnormalities. No evidence of cholelithiasis or acute cholecystitis. Electronically Signed   By: Harmon Pier M.D.   On: 08/09/2015 18:18    ROS:  As stated above in the HPI otherwise negative.  Blood pressure 129/87, pulse 90, temperature 98.2 F (36.8 C), temperature source Oral, resp. rate 16, height  (1.626 m), weight 74.844 kg (165 lb), SpO2 96 %.    PE: Gen: NAD, Alert and Oriented HEENT:  [place]/AT, EOMI, NG tube Neck: Supple, no LAD Lungs: CTA Bilaterally CV: RRR without M/G/R ABM:  Soft, NTND, +BS Ext: No C/C/E  Assessment/Plan: 1) Gastric outlet obstruction/ 2) Large paraesophageal hiatal hernia  Plan: 1) EGD now.  Arieliz Latino D 08/10/2015, 11:35 AM

## 2015-08-10 NOTE — Progress Notes (Signed)
Patient Demographics  Jenna [name], is a 60 y.o. female, DOB - Jul 21, 1956, ZOX:[id number]  Admit date - 08/09/2015   Admitting Physician Alberteen Sam, MD  Outpatient Primary MD for the patient is Mickie Hillier, MD  LOS - 1   Chief Complaint  Patient presents with  . Chest Pain       Admission HPI/Brief narrative: 60 y.o. female with a past medical history significant for pAF on aspirin and hx of gastric ulcer from Excedrin with GIB in 2013 who presents with abdominal pain and vomiting, Further evaluation with a CT scan revealed a large paraesophageal hiatal hernia and a proximal gastric outlet obstruction. An NG tube was placed and it improved her symptoms, recent underwent EGD 1/20 by Dr. Elnoria Howard, unable to reduce hernia endoscopically, with recommendation for surgical repair, surgery on board. Subjective:   Jenna [name] today has, No headache, No chest pain, No abdominal pain - no further vomiting after NG tube insertion.  Assessment & Plan    Principal Problem:   Gastric outflow obstruction Active Problems:   Atrial fibrillation (HCC)   Gastric out let obstruction  Nausea and vomiting secondary to gastric outlet obstruction from [place] esophageal hernia - Symptoms significantly subsided after NG tube insertion, CT abdomen pelvis suggest outlet obstruction from large bore esophageal hernia. - GI consult greatly appreciated, status post endoscopy today, significant for Large paraesophageal hiatal hernia. And Mild Candidal esophagitis, GI was Unable to reduce endoscopically. They recommended surgical repair of hernia, surgery on board,   Candidal esophagitis - No treatment currently as medication won't be able to pass through gastric outlet obstruction  Paroxysmal atrial fibrillation - CHADS2Vasc 1, resume aspirin when able to take oral intake.  Hypokalemia - Repleted with IV  fluids.  Code Status: full  Family Communication: None at bedside  Disposition Plan: Pending further workup   Procedures  Endoscopy 08/10/2015   Consults   Gen. Surgery Gastroenterology   Medications  Scheduled Meds: . enoxaparin (LOVENOX) injection  40 mg Subcutaneous Daily   Continuous Infusions: . dextrose 5 % and 0.45 % NaCl with KCl 20 mEq/L 125 mL/hr at 08/10/15 1309  . lactated ringers Stopped (08/10/15 1247)   PRN Meds:.acetaminophen **OR** acetaminophen, HYDROmorphone (DILAUDID) injection, ondansetron **OR** ondansetron (ZOFRAN) IV  DVT Prophylaxis  Lovenox   Lab Results  Component Value Date   PLT 269 08/10/2015    Antibiotics    Anti-infectives    None          Objective:   Filed Vitals:   08/10/15 1200 08/10/15 1210 08/10/15 1218 08/10/15 1328  BP:  122/73 121/63 126/73  Pulse: 87 89 87   Temp:    97.9 F (36.6 C)  TempSrc:    Oral  Resp: Height:      Weight:      SpO2: 98% 99% 97% 99%    Wt Readings from Last 3 Encounters:  08/10/15 74.844 kg (165 lb)  07/28/14 74.844 kg (165 lb)  08/22/13 77.111 kg (170 lb)     Intake/Output Summary (Last 24 hours) at 08/10/15 1539 Last data filed at 08/10/15 0930  Gross per 24 hour  Intake 1891.67 ml  Output      0 ml  Net 1891.67 ml     Physical Exam  Awake Alert, Oriented X 3, No new F.N deficits, Normal affect [place].AT,PERRAL Supple Neck,No JVD, No cervical lymphadenopathy appriciated.  Symmetrical Chest wall movement, Good air movement bilaterally, CTAB RRR,No Gallops,Rubs or new Murmurs, No Parasternal Heave +ve B.Sounds, Abd Soft, No tenderness, No organomegaly appriciated, No rebound - guarding or rigidity. No Cyanosis, Clubbing or edema, No new Rash or bruise    Data Review   Micro Results No results found for this or any previous visit (from the past 240 hour(s)).  Radiology Reports Dg Chest 2 View  08/09/2015  CLINICAL DATA:  Mid to left chest pain. EXAM:  CHEST  2 VIEW COMPARISON:  07/28/2014. FINDINGS: Increased size of a large hiatal hernia containing an air-fluid level. Interval small amount of linear density at the left lung base. Otherwise, clear lungs. Unremarkable bones. IMPRESSION: 1. Increased size of a large hiatal hernia containing an air-fluid level. 2. Interval mild linear atelectasis or scarring at the left lung base. Electronically Signed   By: Beckie Salts M.D.   On: 08/09/2015 16:05   Ct Abdomen Pelvis W Contrast  08/09/2015  CLINICAL DATA:  Acute generalized abdominal pain. EXAM: CT ABDOMEN AND PELVIS WITH CONTRAST TECHNIQUE: Multidetector CT imaging of the abdomen and pelvis was performed using the standard protocol following bolus administration of intravenous contrast. CONTRAST:  OMNIPAQUE IOHEXOL 300 MG/ML  SOLN COMPARISON:  None. FINDINGS: Severe degenerative disc disease is noted at L1-2. Visualized lung bases are unremarkable. Large paraesophageal hiatal hernia is noted resulting in dilatation and possible obstruction of the more proximal stomach. Small solitary gallstone is noted. No significant abnormality is noted in the liver, spleen or pancreas. Adrenal glands are unremarkable. Parapelvic cysts are noted in left kidney. Otherwise kidneys appear normal. No hydronephrosis or renal obstruction is noted. The appendix appears normal. There is no evidence of large or small bowel obstruction. No abnormal fluid collection is noted. Sigmoid diverticulosis is noted without inflammation. Urinary bladder appears normal. Uterus and ovaries are unremarkable. No significant adenopathy is noted. IMPRESSION: Large paraesophageal hiatal hernia is noted which results in dilatation and probable obstruction of the more proximal stomach. Sigmoid diverticulosis is noted without inflammation. Small solitary gallstone is noted. Electronically Signed   By: Lupita Raider, M.D.   On: 08/09/2015 21:25   US Abdomen Limited  08/09/2015  CLINICAL DATA:   60 year old female with acute right upper quadrant abdominal pain. EXAM: US ABDOMEN LIMITED - RIGHT UPPER QUADRANT COMPARISON:  None. FINDINGS: Gallbladder: A 6 x 9 x 9 mm nonmobile oval hypoechoic structure/polyp along the gallbladder wall is identified. At least 1 area along ring down artifact from the gallbladder wall is noted compatible with adenomyomatosis. There is no evidence of cholelithiasis or acute cholecystitis. Common bile duct: Diameter: 3 mm. Liver: No focal lesion identified. Within normal limits in parenchymal echogenicity. IMPRESSION: 6 x 9 x 9 mm gallbladder polyp. Ultrasound follow-up is recommended in 1 year. This recommendation follows ACR consensus guidelines: White Paper of the ACR Incidental Findings Committee II on Gallbladder and Biliary Findings. J Am Coll Radiol 2013:;10:953-956. Gallbladder adenomyomatosis. No other significant abnormalities. No evidence of cholelithiasis or acute cholecystitis. Electronically Signed   By: Harmon Pier M.D.   On: 08/09/2015 18:18     CBC  Recent Labs Lab 08/09/15 1550 08/10/15 0500  WBC 7.1 10.7*  HGB 12.7 12.0  HCT 39.3 37.9  PLT 268 269  MCV 93.8 94.5  MCH 30.3 29.9  MCHC 32.3 31.7  RDW 12.2 12.6    Chemistries   Recent Labs Lab 08/09/15 1550 08/10/15 0500  NA 141 140  K 3.1* 3.3*  CL 105 104  CO2 26 25  GLUCOSE 109* 140*  BUN 12 7  CREATININE 0.61 0.63  CALCIUM 9.3 9.2  AST 24 21  ALT 13* 12*  ALKPHOS 73 69  BILITOT 0.6 1.1   ------------------------------------------------------------------------------------------------------------------ estimated creatinine clearance is 73.1 mL/min (by C-G formula based on Cr of 0.63). ------------------------------------------------------------------------------------------------------------------ No results for input(s): HGBA1C in the last 72 hours. ------------------------------------------------------------------------------------------------------------------ No  results for input(s): CHOL, HDL, LDLCALC, TRIG, CHOLHDL, LDLDIRECT in the last 72 hours. ------------------------------------------------------------------------------------------------------------------ No results for input(s): TSH, T4TOTAL, T3FREE, THYROIDAB in the last 72 hours.  Invalid input(s): FREET3 ------------------------------------------------------------------------------------------------------------------ No results for input(s): VITAMINB12, FOLATE, FERRITIN, TIBC, IRON, RETICCTPCT in the last 72 hours.  Coagulation profile No results for input(s): INR, PROTIME in the last 168 hours.  No results for input(s): DDIMER in the last 72 hours.  Cardiac Enzymes  Recent Labs Lab 08/09/15 1550  TROPONINI <0.03   ------------------------------------------------------------------------------------------------------------------ Invalid input(s): POCBNP     Time Spent in minutes   30 minutes   Shahin Knierim M.D on 08/10/2015 at 3:39 PM  Between 7am to 7pm - Pager - [phone]  After 7pm go to [email or web address] - password [facility]  [facility]   Office  [phone]

## 2015-08-10 NOTE — Consult Note (Signed)
Jenna [name] Tober [birth year]  [id number].   Primary Care MD: Dr. Hulan Fess Requesting MD: Dr. Myrene Buddy GI Doc: Dr. Juanita Craver Chief Complaint/Reason for Consult: hiatal hernia HPI: This is a pleasant otherwise healthy 60 yo white female who went to eat lunch yesterday with her husband.  She was feeling fine until she ate a couple of bites of her sandwich.  At that time, she developed chest pain that she thought was really bad heartburn.  She then developed nausea.  They left so she could go home and take some reflux medication, which did not seem to help.  She then began to vomit with worsening pain.  She was taken to [place] where she underwent an evaluation which revealed a large hiatal hernia with dilatation and possible obstruction.  She had an NGT placed, which made her feel better, and was transferred to [facility] for GI and general surgery to see.  ROS : Please see HPI, otherwise negative  Family History  Problem Relation Age of Onset  . Hypertension Mother   . Hepatitis Mother   . Heart attack Father   . Hydrocephalus Father   . Cancer Maternal Aunt 25    breast cancer    Past Medical History  Diagnosis Date  . Deviated nasal septum   . Colon polyp 2008    on last colonoscopy with Dr. Collene Mares in 2008  . Hypercholesteremia   . Osteoarthritis   . Allergic rhinitis   . Vitamin B12 deficiency   . Diverticulosis 2008    on last colonoscopy with Dr. Collene Mares in 2008  . Atrial fibrillation ([place]) 08/22/2013  . GERD (gastroesophageal reflux disease)   . Gastric ulcer with hemorrhage   . Iron deficiency anemia     After ulcer in 2013, resolved    Past Surgical History  Procedure Laterality Date  . Tonsillectomy    . Cesarean section    . Nasal sinus surgery      Social History:  reports that she has never smoked. She does not have any smokeless tobacco history on file. She reports that she drinks alcohol. She reports that she does not use illicit drugs.  Allergies:   Allergies  Allergen Reactions  . Bactrim [Sulfamethoxazole-Trimethoprim] Other (See Comments)    Canker sores in her mouth.  . Adhesive [Tape] Rash and Other (See Comments)    Burns to the skin  . Latex Rash and Other (See Comments)    Burns to skin    Medications Prior to Admission  Medication Sig Dispense Refill  . Omega-3 Fatty Acids (FISH OIL PO) Take by mouth.    Marland Kitchen acetaminophen (TYLENOL) 500 MG tablet Take 1,000 mg by mouth every 6 (six) hours as needed for pain.    Marland Kitchen aspirin 81 MG tablet Take 81 mg by mouth daily.    . ferrous sulfate 325 (65 FE) MG tablet Take 1 tablet (325 mg total) by mouth 2 (two) times daily with a meal. 60 tablet 0  . fexofenadine-pseudoephedrine (ALLEGRA-D) 60-120 MG per tablet Take 1 tablet by mouth daily as needed (for allergies). Patient is using this medication for allergies.    . fluticasone (FLONASE) 50 MCG/ACT nasal spray Place 2 sprays into the nose daily as needed for allergies.     Marland Kitchen omeprazole (PRILOSEC) 20 MG capsule TAKE ONE CAPSULE BY MOUTH ONCE DAILY 30 capsule 0    Blood pressure 155/84, pulse 101, temperature 98.3 F (36.8 C), temperature source Oral, resp. rate 18, height 5'  4" (1.626 m), weight 74.844 kg (165 lb), SpO2 97 %. Physical Exam: General: pleasant, WD, WN white female who is laying in bed in NAD HEENT: head is normocephalic, atraumatic.  Sclera are noninjected.  PERRL.  Ears and nose without any masses or lesions.  Mouth is pink.  NGT in place with minimal output currently Heart: regular, rate, and rhythm.  Normal s1,s2. No obvious murmurs, gallops, or rubs noted.  Palpable radial and pedal pulses bilaterally Lungs: CTAB, no wheezes, rhonchi, or rales noted.  Respiratory effort nonlabored Abd: soft, NT, ND, +BS, no masses, hernias, or organomegaly MS: all 4 extremities are symmetrical with no cyanosis, clubbing, or edema. Skin: warm and dry with no masses, lesions, or rashes Psych: A&Ox3 with an appropriate  affect.    Results for orders placed or performed during the hospital encounter of 08/09/15 (from the past 48 hour(s))  Basic metabolic panel     Status: Abnormal   Collection Time: 08/09/15  3:50 PM  Result Value Ref Range   Sodium 141 135 - 145 mmol/L   Potassium 3.1 (L) 3.5 - 5.1 mmol/L   Chloride 105 101 - 111 mmol/L   CO2 26 22 - 32 mmol/L   Glucose, Bld 109 (H) 65 - 99 mg/dL   BUN 12 6 - 20 mg/dL   Creatinine, Ser 0.61 0.44 - 1.00 mg/dL   Calcium 9.3 8.9 - 10.3 mg/dL   GFR calc non Af Amer >60 >60 mL/min   GFR calc Af Amer >60 >60 mL/min    Comment: (NOTE) The eGFR has been calculated using the CKD EPI equation. This calculation has not been validated in all clinical situations. eGFR's persistently <60 mL/min signify possible Chronic Kidney Disease.    Anion gap 10 5 - 15  CBC     Status: None   Collection Time: 08/09/15  3:50 PM  Result Value Ref Range   WBC 7.1 4.0 - 10.5 K/uL   RBC 4.19 3.87 - 5.11 MIL/uL   Hemoglobin 12.7 12.0 - 15.0 g/dL   HCT 39.3 36.0 - 46.0 %   MCV 93.8 78.0 - 100.0 fL   MCH 30.3 26.0 - 34.0 pg   MCHC 32.3 30.0 - 36.0 g/dL   RDW 12.2 11.5 - 15.5 %   Platelets 268 150 - 400 K/uL  Troponin I     Status: None   Collection Time: 08/09/15  3:50 PM  Result Value Ref Range   Troponin I <0.03 <0.031 ng/mL    Comment:        NO INDICATION OF MYOCARDIAL INJURY.   Lipase, blood     Status: None   Collection Time: 08/09/15  3:50 PM  Result Value Ref Range   Lipase 39 11 - 51 U/L  Hepatic function panel     Status: Abnormal   Collection Time: 08/09/15  3:50 PM  Result Value Ref Range   Total Protein 7.2 6.5 - 8.1 g/dL   Albumin 4.2 3.5 - 5.0 g/dL   AST 24 15 - 41 U/L   ALT 13 (L) 14 - 54 U/L   Alkaline Phosphatase 73 38 - 126 U/L   Total Bilirubin 0.6 0.3 - 1.2 mg/dL   Bilirubin, Direct 0.1 0.1 - 0.5 mg/dL   Indirect Bilirubin 0.5 0.3 - 0.9 mg/dL  Urinalysis, Routine w reflex microscopic (not at [facility])     Status: Abnormal   Collection  Time: 08/09/15  7:50 PM  Result Value Ref Range   Color, Urine YELLOW  YELLOW   APPearance CLEAR CLEAR   Specific Gravity, Urine 1.017 1.005 - 1.030   pH 6.5 5.0 - 8.0   Glucose, UA NEGATIVE NEGATIVE mg/dL   Hgb urine dipstick MODERATE (A) NEGATIVE   Bilirubin Urine NEGATIVE NEGATIVE   Ketones, ur 40 (A) NEGATIVE mg/dL   Protein, ur NEGATIVE NEGATIVE mg/dL   Nitrite NEGATIVE NEGATIVE   Leukocytes, UA NEGATIVE NEGATIVE  Urine microscopic-add on     Status: Abnormal   Collection Time: 08/09/15  7:50 PM  Result Value Ref Range   Squamous Epithelial / LPF 0-5 (A) NONE SEEN   WBC, UA NONE SEEN 0 - 5 WBC/hpf   RBC / HPF 6-30 0 - 5 RBC/hpf   Bacteria, UA RARE (A) NONE SEEN   Urine-Other MUCOUS PRESENT   Comprehensive metabolic panel     Status: Abnormal   Collection Time: 08/10/15  5:00 AM  Result Value Ref Range   Sodium 140 135 - 145 mmol/L   Potassium 3.3 (L) 3.5 - 5.1 mmol/L   Chloride 104 101 - 111 mmol/L   CO2 25 22 - 32 mmol/L   Glucose, Bld 140 (H) 65 - 99 mg/dL   BUN 7 6 - 20 mg/dL   Creatinine, Ser 0.63 0.44 - 1.00 mg/dL   Calcium 9.2 8.9 - 10.3 mg/dL   Total Protein 5.9 (L) 6.5 - 8.1 g/dL   Albumin 3.6 3.5 - 5.0 g/dL   AST 21 15 - 41 U/L   ALT 12 (L) 14 - 54 U/L   Alkaline Phosphatase 69 38 - 126 U/L   Total Bilirubin 1.1 0.3 - 1.2 mg/dL   GFR calc non Af Amer >60 >60 mL/min   GFR calc Af Amer >60 >60 mL/min    Comment: (NOTE) The eGFR has been calculated using the CKD EPI equation. This calculation has not been validated in all clinical situations. eGFR's persistently <60 mL/min signify possible Chronic Kidney Disease.    Anion gap 11 5 - 15  CBC     Status: Abnormal   Collection Time: 08/10/15  5:00 AM  Result Value Ref Range   WBC 10.7 (H) 4.0 - 10.5 K/uL   RBC 4.01 3.87 - 5.11 MIL/uL   Hemoglobin 12.0 12.0 - 15.0 g/dL   HCT 37.9 36.0 - 46.0 %   MCV 94.5 78.0 - 100.0 fL   MCH 29.9 26.0 - 34.0 pg   MCHC 31.7 30.0 - 36.0 g/dL   RDW 12.6 11.5 - 15.5 %    Platelets 269 150 - 400 K/uL   Dg Chest 2 View  08/09/2015  CLINICAL DATA:  Mid to left chest pain. EXAM: CHEST  2 VIEW COMPARISON:  07/28/2014. FINDINGS: Increased size of a large hiatal hernia containing an air-fluid level. Interval small amount of linear density at the left lung base. Otherwise, clear lungs. Unremarkable bones. IMPRESSION: 1. Increased size of a large hiatal hernia containing an air-fluid level. 2. Interval mild linear atelectasis or scarring at the left lung base. Electronically Signed   By: Claudie Revering M.D.   On: 08/09/2015 16:05   Ct Abdomen Pelvis W Contrast  08/09/2015  CLINICAL DATA:  Acute generalized abdominal pain. EXAM: CT ABDOMEN AND PELVIS WITH CONTRAST TECHNIQUE: Multidetector CT imaging of the abdomen and pelvis was performed using the standard protocol following bolus administration of intravenous contrast. CONTRAST:  157m OMNIPAQUE IOHEXOL 300 MG/ML  SOLN COMPARISON:  None. FINDINGS: Severe degenerative disc disease is noted at L1-2. Visualized lung bases are unremarkable.  Large paraesophageal hiatal hernia is noted resulting in dilatation and possible obstruction of the more proximal stomach. Small solitary gallstone is noted. No significant abnormality is noted in the liver, spleen or pancreas. Adrenal glands are unremarkable. Parapelvic cysts are noted in left kidney. Otherwise kidneys appear normal. No hydronephrosis or renal obstruction is noted. The appendix appears normal. There is no evidence of large or small bowel obstruction. No abnormal fluid collection is noted. Sigmoid diverticulosis is noted without inflammation. Urinary bladder appears normal. Uterus and ovaries are unremarkable. No significant adenopathy is noted. IMPRESSION: Large paraesophageal hiatal hernia is noted which results in dilatation and probable obstruction of the more proximal stomach. Sigmoid diverticulosis is noted without inflammation. Small solitary gallstone is noted. Electronically  Signed   By: Marijo Conception, M.D.   On: 08/09/2015 21:25   US Abdomen Limited  08/09/2015  CLINICAL DATA:  60 year old female with acute right upper quadrant abdominal pain. EXAM: US ABDOMEN LIMITED - RIGHT UPPER QUADRANT COMPARISON:  None. FINDINGS: Gallbladder: A 6 x 9 x 9 mm nonmobile oval hypoechoic structure/polyp along the gallbladder wall is identified. At least 1 area along ring down artifact from the gallbladder wall is noted compatible with adenomyomatosis. There is no evidence of cholelithiasis or acute cholecystitis. Common bile duct: Diameter: 3 mm. Liver: No focal lesion identified. Within normal limits in parenchymal echogenicity. IMPRESSION: 6 x 9 x 9 mm gallbladder polyp. Ultrasound follow-up is recommended in 1 year. This recommendation follows ACR consensus guidelines: White Paper of the ACR Incidental Findings Committee II on Gallbladder and Biliary Findings. J Am Coll Radiol 2013:;10:953-956. Gallbladder adenomyomatosis. No other significant abnormalities. No evidence of cholelithiasis or acute cholecystitis. Electronically Signed   By: Margarette Canada M.D.   On: 08/09/2015 18:18       Assessment/Plan 1. Large hiatal hernia with possible obstruction -The patient feels better with her NGT in place right now.  GI has been consulted.  An EGD would likely be beneficial to see if any of this can be reduced.  If this is possible, then hopefully she can have her diet advanced and then see [place] in the office for an elective repair.  If she does not improve, then she may need a more urgent procedure while here.  She does have a lot of stomach in her chest though, probably around 60-65%.  Depending on how things go, it may require CT surgery to help [place] do a combined case.  That will be determined by the operating surgeon.  For now, continue NGT management and await GI consult.  Eriel Doyon E 08/10/2015, 9:56 AM Pager: [phone]

## 2015-08-10 NOTE — Anesthesia Postprocedure Evaluation (Signed)
Anesthesia Post Note  Patient: Jenna [name]  Procedure(s) Performed: Procedure(s) (LRB): ESOPHAGOGASTRODUODENOSCOPY (EGD) (N/A)  Patient location during evaluation: PACU Anesthesia Type: MAC Level of consciousness: awake and alert Pain management: pain level controlled Vital Signs Assessment: post-procedure vital signs reviewed and stable Respiratory status: spontaneous breathing, nonlabored ventilation, respiratory function stable and patient connected to nasal cannula oxygen Cardiovascular status: stable and blood pressure returned to baseline Anesthetic complications: no    Last Vitals:  Filed Vitals:   08/10/15 1210 08/10/15 1218  BP: 122/73 121/63  Pulse: 89 87  Temp:    Resp: 13 12    Last Pain:  Filed Vitals:   08/10/15 1218  PainSc: 4                  Aleiyah Halpin S

## 2015-08-10 NOTE — H&P (Signed)
History and Physical  Patient Name: Jenna [name]     ZOX:[id number]    DOB: 12/07/1955    DOA: 08/09/2015 Referring physician: Tyrone Apple, MD PCP: Mickie Hillier, MD      Chief Complaint: Abdominal pain  HPI: Jenna [name] is a 60 y.o. female with a past medical history significant for pAF on aspirin and hx of gastric ulcer from Excedrin with GIB in 2013 who presents with abdominal pain and NBNB vomit.  The patient was completely normal state of health until this evening at supper. She was at a restaurant with her husband when she had sudden onset of intense heartburn. This was central chest discomfort, that was constant and burning in quality and associated with diaphoresis. She immediately went home and took 3 antacids which did not help and so she went to the ER.  In the ED, she had an ECG and troponin that were normal.  A chest x-ray showed a very large paraesophageal hernia.  A follow up CT of the abdomen and pelvis suggested a large paraesophageal hernia read as possibly causing gastric outlet obstruction. An NG tube was placed which relieved vomiting.  [facility] were asked to accept in transfer for admission for gastric outlet obstruction.     Review of Systems:  Pt complains of heartburn, abdominal discomfort, nausea, NBNB emesis, diaphoresis. All other systems negative except as just noted or noted in the history of present illness.   Allergies:  Allergies  Allergen Reactions  . Bactrim [Sulfamethoxazole-Trimethoprim] Other (See Comments)    Canker sores in her mouth.    Home medications: 1. Fish oil 2. Allegra-D 3. Flonase 4. Baby aspirin  Past medical history: 1. Paroxysmal atrial fibrillation 2. History of gastric ulcer in the setting of excessive Excedrin use leading to GI bleed 3. Microcytic anemia after GIB, resolved with iron supplementation  Past surgical history: 1. Tonsillectomy 2. C-section 3. Sinus surgery  Family history:  Father, heart  attack, hydrocephalus Mother, hypertension, hepatitis.   Social History:  Patient lives with her husband who has aphasia after a stroke. 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 She is not a smoker. She still drives and she is independent with all ADLs and IADLs.        Physical Exam: BP 135/78 mmHg  Pulse 106  Temp(Src) 97.1 F (36.2 C) (Oral)  Resp 16  Ht  (1.626 m)  Wt 74.844 kg (165 lb)  BMI 28.31 kg/m2  SpO2 98% General appearance: Well-developed, adult female, alert and in mild distress from NG tube discomfort.   Eyes: Anicteric, conjunctiva pink, lids and lashes normal.     ENT: NG tube in place.  OP moist without lesions.   Lymph: No cervical, supraclavicular lymphadenopathy. Skin: Warm and dry.   Cardiac: RRR, nl S1-S2, no murmurs appreciated.  Capillary refill is brisk.   Respiratory: Normal respiratory rate and rhythm.  CTAB without rales or wheezes. Abdomen: Abdomen soft without rigidity.  No TTP in epigastrium.  Hyperactive bowel sounds. No ascites, distension.   MSK: No deformities or effusions. Neuro: Sensorium intact and responding to questions, attention normal.  Speech is fluent.  Moves all extremities equally and with normal coordination.    Psych: Behavior appropriate.  Affect normal.  No evidence of aural or visual hallucinations or delusions.       Labs on Admission:  The metabolic panel shows hypokalemia. The complete blood count shows no leukocytosis or anemia. Troponin normal. Lipase normal. UA Bland   Radiological Exams  on Admission: Personally reviewed: Dg Chest 2 View 08/09/2015  Large hernia, with stomach adjacent to heart.    Ct Abdomen Pelvis W Contrast 08/09/2015  CLINICAL DATA:  Acute generalized abdominal pain. EXAM: CT ABDOMEN AND PELVIS WITH CONTRAST TECHNIQUE: Multidetector CT imaging of the abdomen and pelvis was performed using the standard protocol following bolus administration of intravenous contrast. CONTRAST:   OMNIPAQUE IOHEXOL 300 MG/ML  SOLN COMPARISON:  None. FINDINGS: Severe degenerative disc disease is noted at L1-2. Visualized lung bases are unremarkable. Large paraesophageal hiatal hernia is noted resulting in dilatation and possible obstruction of the more proximal stomach. Small solitary gallstone is noted. No significant abnormality is noted in the liver, spleen or pancreas. Adrenal glands are unremarkable. Parapelvic cysts are noted in left kidney. Otherwise kidneys appear normal. No hydronephrosis or renal obstruction is noted. The appendix appears normal. There is no evidence of large or small bowel obstruction. No abnormal fluid collection is noted. Sigmoid diverticulosis is noted without inflammation. Urinary bladder appears normal. Uterus and ovaries are unremarkable. No significant adenopathy is noted. IMPRESSION: Large paraesophageal hiatal hernia is noted which results in dilatation and probable obstruction of the more proximal stomach. Sigmoid diverticulosis is noted without inflammation. Small solitary gallstone is noted. Electronically Signed   By: Lupita Raider, M.D.   On: 08/09/2015 21:25   US Abdomen Limited- 08/09/2015  CLINICAL DATA:  60 year old female with acute right upper quadrant abdominal pain. EXAM: US ABDOMEN LIMITED - RIGHT UPPER QUADRANT COMPARISON:  None. FINDINGS: Gallbladder: A 6 x 9 x 9 mm nonmobile oval hypoechoic structure/polyp along the gallbladder wall is identified. At least 1 area along ring down artifact from the gallbladder wall is noted compatible with adenomyomatosis. There is no evidence of cholelithiasis or acute cholecystitis. Common bile duct: Diameter: 3 mm. Liver: No focal lesion identified. Within normal limits in parenchymal echogenicity. IMPRESSION: 6 x 9 x 9 mm gallbladder polyp. Ultrasound follow-up is recommended in 1 year. This recommendation follows ACR consensus guidelines: White Paper of the ACR Incidental Findings Committee II on Gallbladder and  Biliary Findings. J Am Coll Radiol 2013:;10:953-956. Gallbladder adenomyomatosis. No other significant abnormalities. No evidence of cholelithiasis or acute cholecystitis. Electronically Signed   By: Harmon Pier M.D.   On: 08/09/2015 18:18    EKG: Independently reviewed. Sinus, rate 74.  Normal Qtc.  No STTW changes.    Assessment/Plan 1. Vomiting, presumed from gastric outlet obstruction/hernia:  This is new.  The vomiting and nausea are relieved with NG tube.  The CT scan suggests outlet obstruction, related to hernia.   -Maintain NG tube -Consult to GI, appreciate cares -MIVF -Daily electrolytes -Ondansetron for nausea, hydromorphone for pain PRN   2. pAF:  CHADS2Vasc 1.  On baby aspirin only.    3. Gallbladder polyp:  Follow up as outpatient, as per [place] report  4. Hypokalemia:  IVF with K Trend BMP     DVT PPx: Lovenox Diet: NPO Consultants: GI Code Status: Full Family Communication: None  Medical decision making: What exists of the patient's previous chart was reviewed in depth and the case was discussed with Dr. Cyndie Chime. Patient seen 4:08 AM on 08/10/2015.  Disposition Plan:  Admit to observation status. Condition stable.  Anticipate evaluation by GI today, and discharge per their work up.      Alberteen Sam [facility] Pager [phone]

## 2015-08-11 ENCOUNTER — Inpatient Hospital Stay (HOSPITAL_COMMUNITY): Payer: BLUE CROSS/BLUE SHIELD

## 2015-08-11 LAB — BASIC METABOLIC PANEL
ANION GAP: 6 (ref 5–15)
BUN: <5 mg/dL — ABNORMAL LOW (ref 6–20)
CALCIUM: 8.9 mg/dL (ref 8.9–10.3)
CHLORIDE: 109 mmol/L (ref 101–111)
CO2: 25 mmol/L (ref 22–32)
Creatinine, Ser: 0.6 mg/dL (ref 0.44–1.00)
GFR calc non Af Amer: >60 mL/min (ref >60)
Glucose, Bld: 112 mg/dL — ABNORMAL HIGH (ref 65–99)
Potassium: 3.2 mmol/L — ABNORMAL LOW (ref 3.5–5.1)
SODIUM: 140 mmol/L (ref 135–145)

## 2015-08-11 LAB — PHOSPHORUS: PHOSPHORUS: 2.8 mg/dL (ref 2.5–4.6)

## 2015-08-11 LAB — MAGNESIUM: MAGNESIUM: 2.1 mg/dL (ref 1.7–2.4)

## 2015-08-11 MED ORDER — HYDROMORPHONE HCL 1 MG/ML IJ SOLN: 0.5000 mg | INTRAMUSCULAR | Status: DC | PRN

## 2015-08-11 MED ORDER — POTASSIUM CHLORIDE 10 MEQ/100ML IV SOLN
10.0000 meq | INTRAVENOUS | Status: AC
  Administered 2015-08-11 (×3): 10 meq via INTRAVENOUS
  Filled 2015-08-11 (×6): qty 100

## 2015-08-11 NOTE — Progress Notes (Signed)
1 Day Post-Op  Subjective: NG out since the EGD, she did vomit some this AM.  Feels the heart burn symptoms coming on.    Objective: Vital signs in last 24 hours: Temp:  [97.9 F (36.6 C)-98.4 F (36.9 C)] 98.1 F (36.7 C) (01/21 0542) Pulse Rate:  [80-90] 80 (01/21 0542) Resp:  [12-18] 18 (01/21 0542) BP: (114-137)/(54-87) 123/70 mmHg (01/21 0542) SpO2:  [95 %-99 %] 95 % (01/21 0542) Weight:  [74.844 kg (165 lb)] 74.844 kg (165 lb) (01/20 1053) Last BM Date: 08/09/15 (1/19 am ) NG not recorded Urine x 3 Afebrile, VSS K+ 3.2 For UGI today Intake/Output from previous day: 01/20 0701 - 01/21 0700 In: 1413.8 [I.V.:1413.8] Out: -  Intake/Output this shift:    General appearance: alert, cooperative and no distress  GI: soft, non-tender; bowel sounds normal; no masses,  no organomegaly  Lab Results:   Recent Labs  08/09/15 1550 08/10/15 0500  WBC 7.1 10.7*  HGB 12.7 12.0  HCT 39.3 37.9  PLT 268 269    BMET  Recent Labs  08/10/15 0500 08/11/15 0317  NA 140 140  K 3.3* 3.2*  CL 104 109  CO2 25 25  GLUCOSE 140* 112*  BUN 7 <5*  CREATININE 0.63 0.60  CALCIUM 9.2 8.9   PT/INR No results for input(s): LABPROT, INR in the last 72 hours.   Recent Labs Lab 08/09/15 1550 08/10/15 0500  AST 24 21  ALT 13* 12*  ALKPHOS 73 69  BILITOT 0.6 1.1  PROT 7.2 5.9*  ALBUMIN 4.2 3.6     Lipase     Component Value Date/Time   LIPASE 39 08/09/2015 1550     Studies/Results: Dg Chest 2 View  08/09/2015  CLINICAL DATA:  Mid to left chest pain. EXAM: CHEST  2 VIEW COMPARISON:  07/28/2014. FINDINGS: Increased size of a large hiatal hernia containing an air-fluid level. Interval small amount of linear density at the left lung base. Otherwise, clear lungs. Unremarkable bones. IMPRESSION: 1. Increased size of a large hiatal hernia containing an air-fluid level. 2. Interval mild linear atelectasis or scarring at the left lung base. Electronically Signed   By: Beckie Salts  M.D.   On: 08/09/2015 16:05   Ct Abdomen Pelvis W Contrast  08/09/2015  CLINICAL DATA:  Acute generalized abdominal pain. EXAM: CT ABDOMEN AND PELVIS WITH CONTRAST TECHNIQUE: Multidetector CT imaging of the abdomen and pelvis was performed using the standard protocol following bolus administration of intravenous contrast. CONTRAST:  OMNIPAQUE IOHEXOL 300 MG/ML  SOLN COMPARISON:  None. FINDINGS: Severe degenerative disc disease is noted at L1-2. Visualized lung bases are unremarkable. Large paraesophageal hiatal hernia is noted resulting in dilatation and possible obstruction of the more proximal stomach. Small solitary gallstone is noted. No significant abnormality is noted in the liver, spleen or pancreas. Adrenal glands are unremarkable. Parapelvic cysts are noted in left kidney. Otherwise kidneys appear normal. No hydronephrosis or renal obstruction is noted. The appendix appears normal. There is no evidence of large or small bowel obstruction. No abnormal fluid collection is noted. Sigmoid diverticulosis is noted without inflammation. Urinary bladder appears normal. Uterus and ovaries are unremarkable. No significant adenopathy is noted. IMPRESSION: Large paraesophageal hiatal hernia is noted which results in dilatation and probable obstruction of the more proximal stomach. Sigmoid diverticulosis is noted without inflammation. Small solitary gallstone is noted. Electronically Signed   By: Lupita Raider, M.D.   On: 08/09/2015 21:25   US Abdomen Limited  08/09/2015  CLINICAL DATA:  60 year old female with acute right upper quadrant abdominal pain. EXAM: US ABDOMEN LIMITED - RIGHT UPPER QUADRANT COMPARISON:  None. FINDINGS: Gallbladder: A 6 x 9 x 9 mm nonmobile oval hypoechoic structure/polyp along the gallbladder wall is identified. At least 1 area along ring down artifact from the gallbladder wall is noted compatible with adenomyomatosis. There is no evidence of cholelithiasis or acute  cholecystitis. Common bile duct: Diameter: 3 mm. Liver: No focal lesion identified. Within normal limits in parenchymal echogenicity. IMPRESSION: 6 x 9 x 9 mm gallbladder polyp. Ultrasound follow-up is recommended in 1 year. This recommendation follows ACR consensus guidelines: White Paper of the ACR Incidental Findings Committee II on Gallbladder and Biliary Findings. J Am Coll Radiol 2013:;10:953-956. Gallbladder adenomyomatosis. No other significant abnormalities. No evidence of cholelithiasis or acute cholecystitis. Electronically Signed   By: Harmon Pier M.D.   On: 08/09/2015 18:18    Medications: . enoxaparin (LOVENOX) injection  40 mg Subcutaneous Daily    Assessment/Plan Large hiatal hernia with possible obstruction S/p EGD byLarge paraesophageal hiatal hernia. Unable to reduce endoscopically, Mild Candidal esophagitis. /Dr. Elnoria Howard 08/10/15. PAF Antibiotics:  noe DVT: lovenox    Plan:  I don't see the UGI ordered nor does the nurse find in on his screen.  We will get the UGI and ask them to place NG back under fluro while she is downstairs.  Dr. Dwain Sarna discussed the different approaches to this issue and the preference to do it laparoscopically and non emergently.       LOS: 2 days    Nam Vossler 08/11/2015

## 2015-08-11 NOTE — Progress Notes (Signed)
Patient Demographics  Jenna [name], is a 60 y.o. female, DOB - [birth year], WGN:[id number]  Admit date - 08/09/2015   Admitting Physician Alberteen Sam, MD  Outpatient Primary MD for the patient is Mickie Hillier, MD  LOS - 2   Chief Complaint  Patient presents with  . Chest Pain       Admission HPI/Brief narrative: 60 y.o. female with a past medical history significant for pAF on aspirin and hx of gastric ulcer from Excedrin with GIB in 2013 who presents with abdominal pain and vomiting, Further evaluation with a CT scan revealed a large paraesophageal hiatal hernia and a proximal gastric outlet obstruction. An NG tube was placed and it improved her symptoms, recent underwent EGD 1/20 by Dr. Elnoria Howard, unable to reduce hernia endoscopically, with recommendation for surgical repair, surgery on board. Subjective:   Jenna [name] today has, No headache, No chest pain, No abdominal pain - episode of small emesis this a.m., complain of heartburn .  Assessment & Plan    Principal Problem:   Gastric outflow obstruction Active Problems:   Atrial fibrillation (HCC)   Gastric out let obstruction  Nausea and vomiting secondary to gastric outlet obstruction from [place] esophageal hernia - Symptoms significantly subsided after NG tube insertion, CT abdomen pelvis suggest outlet obstruction from large bore esophageal hernia. - GI consult greatly appreciated, status post endoscopy 1/20, significant for Large paraesophageal hiatal hernia. And Mild Candidal esophagitis, GI was Unable to reduce endoscopically. They recommended surgical repair of hernia, surgery on board, but GI series pending . - Continue with IV fluid as remains nothing by mouth.  Candidal esophagitis - No treatment currently as medication won't be able to pass through gastric outlet obstruction  Paroxysmal atrial fibrillation -  CHADS2Vasc 1, resume aspirin when able to take oral intake.  Hypokalemia - Repleted , recheck in a.m..  Code Status: full  Family Communication: None at bedside  Disposition Plan: Pending further workup   Procedures  Endoscopy 08/10/2015   Consults   Gen. Surgery Gastroenterology   Medications  Scheduled Meds: . enoxaparin (LOVENOX) injection  40 mg Subcutaneous Daily   Continuous Infusions: . dextrose 5 % and 0.45 % NaCl with KCl 20 mEq/L 75 mL/hr at 08/10/15 1539  . lactated ringers Stopped (08/10/15 1247)   PRN Meds:.acetaminophen **OR** acetaminophen, HYDROmorphone (DILAUDID) injection, ondansetron **OR** ondansetron (ZOFRAN) IV  DVT Prophylaxis  Lovenox   Lab Results  Component Value Date   PLT 269 08/10/2015    Antibiotics    Anti-infectives    None          Objective:   Filed Vitals:   08/10/15 1218 08/10/15 1328 08/10/15 1944 08/11/15 0542  BP: 121/63 126/73 137/84 123/70  Pulse: 87  89 80  Temp:  97.9 F (36.6 C) 98.4 F (36.9 C) 98.1 F (36.7 C)  TempSrc:  Oral Oral Oral  Resp: Height:      Weight:      SpO2: 97% 99% 96% 95%    Wt Readings from Last 3 Encounters:  08/10/15 74.844 kg (165 lb)  07/28/14 74.844 kg (165 lb)  08/22/13 77.111 kg (170 lb)     Intake/Output Summary (Last 24 hours) at 08/11/15 1244 Last  data filed at 08/10/15 1740  Gross per 24 hour  Intake 1413.75 ml  Output      0 ml  Net 1413.75 ml     Physical Exam  Awake Alert, Oriented X 3, No new F.N deficits, Normal affect [place].AT,PERRAL Supple Neck,No JVD, No cervical lymphadenopathy appriciated.  Symmetrical Chest wall movement, Good air movement bilaterally, CTAB RRR,No Gallops,Rubs or new Murmurs, No Parasternal Heave +ve B.Sounds, Abd Soft, No tenderness, No organomegaly appriciated, No rebound - guarding or rigidity. No Cyanosis, Clubbing or edema, No new Rash or bruise    Data Review   Micro Results No results found for this or  any previous visit (from the past 240 hour(s)).  Radiology Reports Dg Chest 2 View  08/09/2015  CLINICAL DATA:  Mid to left chest pain. EXAM: CHEST  2 VIEW COMPARISON:  07/28/2014. FINDINGS: Increased size of a large hiatal hernia containing an air-fluid level. Interval small amount of linear density at the left lung base. Otherwise, clear lungs. Unremarkable bones. IMPRESSION: 1. Increased size of a large hiatal hernia containing an air-fluid level. 2. Interval mild linear atelectasis or scarring at the left lung base. Electronically Signed   By: Beckie Salts M.D.   On: 08/09/2015 16:05   Ct Abdomen Pelvis W Contrast  08/09/2015  CLINICAL DATA:  Acute generalized abdominal pain. EXAM: CT ABDOMEN AND PELVIS WITH CONTRAST TECHNIQUE: Multidetector CT imaging of the abdomen and pelvis was performed using the standard protocol following bolus administration of intravenous contrast. CONTRAST:  OMNIPAQUE IOHEXOL 300 MG/ML  SOLN COMPARISON:  None. FINDINGS: Severe degenerative disc disease is noted at L1-2. Visualized lung bases are unremarkable. Large paraesophageal hiatal hernia is noted resulting in dilatation and possible obstruction of the more proximal stomach. Small solitary gallstone is noted. No significant abnormality is noted in the liver, spleen or pancreas. Adrenal glands are unremarkable. Parapelvic cysts are noted in left kidney. Otherwise kidneys appear normal. No hydronephrosis or renal obstruction is noted. The appendix appears normal. There is no evidence of large or small bowel obstruction. No abnormal fluid collection is noted. Sigmoid diverticulosis is noted without inflammation. Urinary bladder appears normal. Uterus and ovaries are unremarkable. No significant adenopathy is noted. IMPRESSION: Large paraesophageal hiatal hernia is noted which results in dilatation and probable obstruction of the more proximal stomach. Sigmoid diverticulosis is noted without inflammation. Small solitary  gallstone is noted. Electronically Signed   By: Lupita Raider, M.D.   On: 08/09/2015 21:25   US Abdomen Limited  08/09/2015  CLINICAL DATA:  60 year old female with acute right upper quadrant abdominal pain. EXAM: US ABDOMEN LIMITED - RIGHT UPPER QUADRANT COMPARISON:  None. FINDINGS: Gallbladder: A 6 x 9 x 9 mm nonmobile oval hypoechoic structure/polyp along the gallbladder wall is identified. At least 1 area along ring down artifact from the gallbladder wall is noted compatible with adenomyomatosis. There is no evidence of cholelithiasis or acute cholecystitis. Common bile duct: Diameter: 3 mm. Liver: No focal lesion identified. Within normal limits in parenchymal echogenicity. IMPRESSION: 6 x 9 x 9 mm gallbladder polyp. Ultrasound follow-up is recommended in 1 year. This recommendation follows ACR consensus guidelines: White Paper of the ACR Incidental Findings Committee II on Gallbladder and Biliary Findings. J Am Coll Radiol 2013:;10:953-956. Gallbladder adenomyomatosis. No other significant abnormalities. No evidence of cholelithiasis or acute cholecystitis. Electronically Signed   By: Harmon Pier M.D.   On: 08/09/2015 18:18     CBC  Recent Labs Lab 08/09/15 1550 08/10/15 0500  WBC 7.1 10.7*  HGB 12.7 12.0  HCT 39.3 37.9  PLT 268 269  MCV 93.8 94.5  MCH 30.3 29.9  MCHC 32.3 31.7  RDW 12.2 12.6    Chemistries   Recent Labs Lab 08/09/15 1550 08/10/15 0500 08/11/15 0317  NA 141 140 140  K 3.1* 3.3* 3.2*  CL 105 104 109  CO2 GLUCOSE 109* 140* 112*  BUN 12 7 <5*  CREATININE 0.61 0.63 0.60  CALCIUM 9.3 9.2 8.9  MG  --   --  2.1  AST 24 21  --   ALT 13* 12*  --   ALKPHOS 73 69  --   BILITOT 0.6 1.1  --    ------------------------------------------------------------------------------------------------------------------ estimated creatinine clearance is 73.1 mL/min (by C-G formula based on Cr of  0.6). ------------------------------------------------------------------------------------------------------------------ No results for input(s): HGBA1C in the last 72 hours. ------------------------------------------------------------------------------------------------------------------ No results for input(s): CHOL, HDL, LDLCALC, TRIG, CHOLHDL, LDLDIRECT in the last 72 hours. ------------------------------------------------------------------------------------------------------------------ No results for input(s): TSH, T4TOTAL, T3FREE, THYROIDAB in the last 72 hours.  Invalid input(s): FREET3 ------------------------------------------------------------------------------------------------------------------ No results for input(s): VITAMINB12, FOLATE, FERRITIN, TIBC, IRON, RETICCTPCT in the last 72 hours.  Coagulation profile No results for input(s): INR, PROTIME in the last 168 hours.  No results for input(s): DDIMER in the last 72 hours.  Cardiac Enzymes  Recent Labs Lab 08/09/15 1550  TROPONINI <0.03   ------------------------------------------------------------------------------------------------------------------ Invalid input(s): POCBNP     Time Spent in minutes   25 minutes   Jenna [name] M.D on 08/11/2015 at 12:44 PM  Between 7am to 7pm - Pager - [phone]  After 7pm go to [email or web address] - password [facility]  [facility]   Office  [phone]

## 2015-08-12 LAB — CBC
HCT: 37.6 % (ref 36.0–46.0)
HEMOGLOBIN: 12.3 g/dL (ref 12.0–15.0)
MCH: 30.8 pg (ref 26.0–34.0)
MCHC: 32.7 g/dL (ref 30.0–36.0)
MCV: 94.2 fL (ref 78.0–100.0)
Platelets: 258 10*3/uL (ref 150–400)
RBC: 3.99 MIL/uL (ref 3.87–5.11)
RDW: 12.5 % (ref 11.5–15.5)
WBC: 7.4 10*3/uL (ref 4.0–10.5)

## 2015-08-12 LAB — BASIC METABOLIC PANEL
Anion gap: 7 (ref 5–15)
CHLORIDE: 109 mmol/L (ref 101–111)
CO2: 25 mmol/L (ref 22–32)
Calcium: 9.2 mg/dL (ref 8.9–10.3)
Creatinine, Ser: 0.64 mg/dL (ref 0.44–1.00)
GFR calc Af Amer: >60 mL/min (ref >60)
GFR calc non Af Amer: >60 mL/min (ref >60)
Glucose, Bld: 108 mg/dL — ABNORMAL HIGH (ref 65–99)
POTASSIUM: 3.4 mmol/L — AB (ref 3.5–5.1)
SODIUM: 141 mmol/L (ref 135–145)

## 2015-08-12 MED ORDER — POTASSIUM CHLORIDE 10 MEQ/100ML IV SOLN
10.0000 meq | INTRAVENOUS | Status: AC
  Administered 2015-08-12 (×2): 10 meq via INTRAVENOUS
  Filled 2015-08-12 (×2): qty 100

## 2015-08-12 MED ORDER — POTASSIUM CHLORIDE 10 MEQ/100ML IV SOLN: 10.0000 meq | INTRAVENOUS | Status: DC

## 2015-08-12 MED ORDER — HYDROMORPHONE HCL 1 MG/ML IJ SOLN
0.5000 mg | INTRAMUSCULAR | Status: DC | PRN
  Administered 2015-08-15: 0.5 mg via INTRAVENOUS
  Administered 2015-08-16 (×2): 1 mg via INTRAVENOUS
  Administered 2015-08-16: 0.5 mg via INTRAVENOUS
  Administered 2015-08-16 – 2015-08-17 (×2): 1 mg via INTRAVENOUS
  Filled 2015-08-12 (×6): qty 1

## 2015-08-12 MED ORDER — POTASSIUM CHLORIDE CRYS ER 20 MEQ PO TBCR: 40.0000 meq | EXTENDED_RELEASE_TABLET | Freq: Once | ORAL | Status: DC

## 2015-08-12 NOTE — Progress Notes (Signed)
2 Days Post-Op  Subjective: No complaints this am  Objective: Vital signs in last 24 hours: Temp:  [97.7 F (36.5 C)-98.6 F (37 C)] 97.7 F (36.5 C) (01/22 0411) Pulse Rate:  [82-86] 82 (01/22 0411) Resp:  [16] 16 (01/22 0411) BP: (126-133)/(74-76) 126/74 mmHg (01/22 0411) SpO2:  [94 %-96 %] 94 % (01/22 0411) Last BM Date: 08/09/15 (1/19 am )  Intake/Output from previous day:   Intake/Output this shift:    GI: soft nt nd  Lab Results:   Recent Labs  08/10/15 0500 08/12/15 0840  WBC 10.7* 7.4  HGB 12.0 12.3  HCT 37.9 37.6  PLT 269 258   BMET  Recent Labs  08/10/15 0500 08/11/15 0317  NA 140 140  K 3.3* 3.2*  CL 104 109  CO2 25 25  GLUCOSE 140* 112*  BUN 7 <5*  CREATININE 0.63 0.60  CALCIUM 9.2 8.9   PT/INR No results for input(s): LABPROT, INR in the last 72 hours. ABG No results for input(s): PHART, HCO3 in the last 72 hours.  Invalid input(s): PCO2, PO2  Studies/Results: Dg Ugi W/water Sol Cm  08/11/2015  CLINICAL DATA:  Hiatal hernia with obstruction. EXAM: UPPER GI SERIES WITH KUB TECHNIQUE: After obtaining a scout radiograph a routine upper GI series was performed using thin Gastrografin. FLUOROSCOPY TIME:  Fluoroscopy Time (in minutes and seconds): 1 minutes and 18 seconds COMPARISON:  CT abdomen and pelvis dated 08/09/2015. FINDINGS: Initial images showed slow contrast passage from the distal esophagus to the proximal stomach. 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 Associated tight narrowings noted just below the gastroesophageal junction and within the mid gastric body region. There were multiple episodes of reflux to the level of the mid esophagus, with subsequent development of tertiary contractions and dysmotility with to and fro flow of contrast within the distal esophagus. Patient experienced no emesis during the exam.  Contrast did eventually pass to the pylorus after some delay. I was asked by the physician assistant to place a nasogastric tube if possible. Due to this configuration of the stomach, nasogastric tube placement was not attempted. IMPRESSION: Marked tortuosity of the stomach, commencing just below the level of the gastroesophageal junction, most suggestive of organoaxial gastric volvulus, with associated tight narrowings noted just below the level of the gastroesophageal junction and within the mid gastric body region. Contrast did eventually move through the stomach after some delay. Multiple episodes of associated reflux identified during the examination to the level of the mid esophagus, with subsequent development of tertiary contractions and esophageal dysmotility with "to and fro" flow of contrast in the distal esophagus. Findings discussed with the surgeon on call, Dr. Violeta Gelinas, at the time of the exam. Electronically Signed   By: Bary Richard M.D.   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 Discussed attempt at laparoscopic repair which will need to discuss with lap surgeons in am  [facility] 08/12/2015

## 2015-08-12 NOTE — Progress Notes (Signed)
Patient Demographics  Magda Muise, is a 60 y.o. female, DOB - 1955-10-26, ZOX:[id number]  Admit date - 08/09/2015   Admitting Physician Alberteen Sam, MD  Outpatient Primary MD for the patient is Mickie Hillier, MD  LOS - 3   Chief Complaint  Patient presents with  . Chest Pain       Admission HPI/Brief narrative: 60 y.o. female with a past medical history significant for pAF on aspirin and hx of gastric ulcer from Excedrin with GIB in 2013 who presents with abdominal pain and vomiting, Further evaluation with a CT scan revealed a large paraesophageal hiatal hernia and a proximal gastric outlet obstruction. An NG tube was placed and it improved her symptoms, recent underwent EGD 1/20 by Dr. Elnoria Howard, unable to reduce hernia endoscopically, with recommendation for surgical repair, surgery on board. Subjective:   Ilse Billman today has, No headache, No chest pain, No abdominal pain, no vomiting.  Assessment & Plan    Principal Problem:   Gastric outflow obstruction Active Problems:   Atrial fibrillation (HCC)   Gastric out let obstruction  Nausea and vomiting secondary to gastric outlet obstruction from Paraesophageal hernia - Symptoms significantly subsided after NG tube insertion, CT abdomen pelvis suggest outlet obstruction from large bore esophageal hernia. - GI consult greatly appreciated, status post endoscopy 1/20, significant for Large paraesophageal hiatal hernia. And Mild Candidal esophagitis, GI was Unable to reduce endoscopically. They recommended surgical repair of hernia, surgery on board, very likely she'll need laparoscopic repair - Continue with IV fluid as remains nothing by mouth.  Candidal esophagitis - No treatment currently as medication won't be able to pass through gastric outlet obstruction  Paroxysmal atrial fibrillation - CHADS2Vasc 1, resume aspirin when  able to take oral intake.  Hypokalemia - Repleted , recheck in a.m..  Code Status: full  Family Communication: None at bedside  Disposition Plan: Pending further workup   Procedures  Endoscopy 08/10/2015   Consults   Gen. Surgery Gastroenterology   Medications  Scheduled Meds: . enoxaparin (LOVENOX) injection  40 mg Subcutaneous Daily  . potassium chloride  10 mEq Intravenous Q1 Hr x 2   Continuous Infusions: . dextrose 5 % and 0.45 % NaCl with KCl 20 mEq/L 75 mL/hr at 08/11/15 1554  . lactated ringers Stopped (08/10/15 1247)   PRN Meds:.acetaminophen **OR** acetaminophen, HYDROmorphone (DILAUDID) injection, ondansetron **OR** ondansetron (ZOFRAN) IV  DVT Prophylaxis  Lovenox   Lab Results  Component Value Date   PLT 258 08/12/2015    Antibiotics    Anti-infectives    None          Objective:   Filed Vitals:   08/11/15 1500 08/11/15 2115 08/12/15 0411 08/12/15 1356  BP: 132/76 133/75 126/74 132/74  Pulse: 86 82 82 82  Temp: 98.2 F (36.8 C) 98.6 F (37 C) 97.7 F (36.5 C) 98 F (36.7 C)  TempSrc: Oral Oral Oral Oral  Resp: Height:      Weight:      SpO2: 96% 95% 94% 95%    Wt Readings from Last 3 Encounters:  08/10/15 74.844 kg (165 lb)  07/28/14 74.844 kg (165 lb)  08/22/13 77.111 kg (170 lb)     Intake/Output Summary (Last  24 hours) at 08/12/15 1501 Last data filed at 08/11/15 1700  Gross per 24 hour  Intake      0 ml  Output      0 ml  Net      0 ml     Physical Exam  Awake Alert, Oriented X 3, No new F.N deficits, Normal affect [place].AT,PERRAL Supple Neck,No JVD, No cervical lymphadenopathy appriciated.  Symmetrical Chest wall movement, Good air movement bilaterally, CTAB RRR,No Gallops,Rubs or new Murmurs, No Parasternal Heave +ve B.Sounds, Abd Soft, No tenderness, No organomegaly appriciated, No rebound - guarding or rigidity. No Cyanosis, Clubbing or edema, No new Rash or bruise    Data Review   Micro  Results No results found for this or any previous visit (from the past 240 hour(s)).  Radiology Reports Dg Chest 2 View  08/09/2015  CLINICAL DATA:  Mid to left chest pain. EXAM: CHEST  2 VIEW COMPARISON:  07/28/2014. FINDINGS: Increased size of a large hiatal hernia containing an air-fluid level. Interval small amount of linear density at the left lung base. Otherwise, clear lungs. Unremarkable bones. IMPRESSION: 1. Increased size of a large hiatal hernia containing an air-fluid level. 2. Interval mild linear atelectasis or scarring at the left lung base. Electronically Signed   By: Beckie Salts M.D.   On: 08/09/2015 16:05   Ct Abdomen Pelvis W Contrast  08/09/2015  CLINICAL DATA:  Acute generalized abdominal pain. EXAM: CT ABDOMEN AND PELVIS WITH CONTRAST TECHNIQUE: Multidetector CT imaging of the abdomen and pelvis was performed using the standard protocol following bolus administration of intravenous contrast. CONTRAST:  OMNIPAQUE IOHEXOL 300 MG/ML  SOLN COMPARISON:  None. FINDINGS: Severe degenerative disc disease is noted at L1-2. Visualized lung bases are unremarkable. Large paraesophageal hiatal hernia is noted resulting in dilatation and possible obstruction of the more proximal stomach. Small solitary gallstone is noted. No significant abnormality is noted in the liver, spleen or pancreas. Adrenal glands are unremarkable. Parapelvic cysts are noted in left kidney. Otherwise kidneys appear normal. No hydronephrosis or renal obstruction is noted. The appendix appears normal. There is no evidence of large or small bowel obstruction. No abnormal fluid collection is noted. Sigmoid diverticulosis is noted without inflammation. Urinary bladder appears normal. Uterus and ovaries are unremarkable. No significant adenopathy is noted. IMPRESSION: Large paraesophageal hiatal hernia is noted which results in dilatation and probable obstruction of the more proximal stomach. Sigmoid diverticulosis is noted  without inflammation. Small solitary gallstone is noted. Electronically Signed   By: Lupita Raider, M.D.   On: 08/09/2015 21:25   US Abdomen Limited  08/09/2015  CLINICAL DATA:  60 year old female with acute right upper quadrant abdominal pain. EXAM: US ABDOMEN LIMITED - RIGHT UPPER QUADRANT COMPARISON:  None. FINDINGS: Gallbladder: A 6 x 9 x 9 mm nonmobile oval hypoechoic structure/polyp along the gallbladder wall is identified. At least 1 area along ring down artifact from the gallbladder wall is noted compatible with adenomyomatosis. There is no evidence of cholelithiasis or acute cholecystitis. Common bile duct: Diameter: 3 mm. Liver: No focal lesion identified. Within normal limits in parenchymal echogenicity. IMPRESSION: 6 x 9 x 9 mm gallbladder polyp. Ultrasound follow-up is recommended in 1 year. This recommendation follows ACR consensus guidelines: White Paper of the ACR Incidental Findings Committee II on Gallbladder and Biliary Findings. J Am Coll Radiol 2013:;10:953-956. Gallbladder adenomyomatosis. No other significant abnormalities. No evidence of cholelithiasis or acute cholecystitis. Electronically Signed   By: Harmon Pier M.D.   On:  08/09/2015 18:18   Dg Kayleen Memos W/water Sol Cm  08/11/2015  CLINICAL DATA:  Hiatal hernia with obstruction. EXAM: UPPER GI SERIES WITH KUB TECHNIQUE: After obtaining a scout radiograph a routine upper GI series was performed using thin Gastrografin. FLUOROSCOPY TIME:  Fluoroscopy Time (in minutes and seconds): 1 minutes and 18 seconds COMPARISON:  CT abdomen and pelvis dated 08/09/2015. FINDINGS: Initial images showed slow contrast passage from the distal esophagus to the proximal stomach. 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 Associated tight narrowings noted just below the gastroesophageal junction and within the mid gastric body region. There  were multiple episodes of reflux to the level of the mid esophagus, with subsequent development of tertiary contractions and dysmotility with to and fro flow of contrast within the distal esophagus. Patient experienced no emesis during the exam. Contrast did eventually pass to the pylorus after some delay. I was asked by the physician assistant to place a nasogastric tube if possible. Due to this configuration of the stomach, nasogastric tube placement was not attempted. IMPRESSION: Marked tortuosity of the stomach, commencing just below the level of the gastroesophageal junction, most suggestive of organoaxial gastric volvulus, with associated tight narrowings noted just below the level of the gastroesophageal junction and within the mid gastric body region. Contrast did eventually move through the stomach after some delay. Multiple episodes of associated reflux identified during the examination to the level of the mid esophagus, with subsequent development of tertiary contractions and esophageal dysmotility with "to and fro" flow of contrast in the distal esophagus. Findings discussed with the surgeon on call, Dr. Violeta Gelinas, at the time of the exam. Electronically Signed   By: Bary Richard M.D.   On: 08/11/2015 15:03     CBC  Recent Labs Lab 08/09/15 1550 08/10/15 0500 08/12/15 0840  WBC 7.1 10.7* 7.4  HGB 12.7 12.0 12.3  HCT 39.3 37.9 37.6  PLT 268 269 258  MCV 93.8 94.5 94.2  MCH 30.3 29.9 30.8  MCHC 32.3 31.7 32.7  RDW 12.2 12.6 12.5    Chemistries   Recent Labs Lab 08/09/15 1550 08/10/15 0500 08/11/15 0317 08/12/15 0840  NA 141 140 140 141  K 3.1* 3.3* 3.2* 3.4*  CL 105 104 109 109  CO2 GLUCOSE 109* 140* 112* 108*  BUN 12 7 <5* <5*  CREATININE 0.61 0.63 0.60 0.64  CALCIUM 9.3 9.2 8.9 9.2  MG  --   --  2.1  --   AST 24 21  --   --   ALT 13* 12*  --   --   ALKPHOS 73 69  --   --   BILITOT 0.6 1.1  --   --     ------------------------------------------------------------------------------------------------------------------ estimated creatinine clearance is 73.1 mL/min (by C-G formula based on Cr of 0.64). ------------------------------------------------------------------------------------------------------------------ No results for input(s): HGBA1C in the last 72 hours. ------------------------------------------------------------------------------------------------------------------ No results for input(s): CHOL, HDL, LDLCALC, TRIG, CHOLHDL, LDLDIRECT in the last 72 hours. ------------------------------------------------------------------------------------------------------------------ No results for input(s): TSH, T4TOTAL, T3FREE, THYROIDAB in the last 72 hours.  Invalid input(s): FREET3 ------------------------------------------------------------------------------------------------------------------ No results for input(s): VITAMINB12, FOLATE, FERRITIN, TIBC, IRON, RETICCTPCT in the last 72 hours.  Coagulation profile No results for input(s): INR, PROTIME in the last 168 hours.  No results for input(s): DDIMER in the last 72 hours.  Cardiac Enzymes  Recent Labs Lab 08/09/15 1550  TROPONINI <0.03   ------------------------------------------------------------------------------------------------------------------  Invalid input(s): POCBNP     Time Spent in minutes   20 minutes   Armanda Forand M.D on 08/12/2015 at 3:01 PM  Between 7am to 7pm - Pager - [phone]  After 7pm go to [email or web address] - password [facility]  [facility]   Office  [phone]

## 2015-08-13 ENCOUNTER — Encounter (HOSPITAL_COMMUNITY): Payer: Self-pay | Admitting: Gastroenterology

## 2015-08-13 LAB — PREALBUMIN: Prealbumin: 28.8 mg/dL (ref 18–38)

## 2015-08-13 MED ORDER — SODIUM CHLORIDE 0.9 % IJ SOLN
10.0000 mL | INTRAMUSCULAR | Status: DC | PRN
  Administered 2015-08-14: 20 mL
  Administered 2015-08-15: 10 mL
  Administered 2015-08-16: 20 mL
  Administered 2015-08-16: 10 mL
  Administered 2015-08-17: 20 mL
  Filled 2015-08-13 (×5): qty 40

## 2015-08-13 MED ORDER — FLUCONAZOLE IN SODIUM CHLORIDE 100-0.9 MG/50ML-% IV SOLN
100.0000 mg | INTRAVENOUS | Status: DC
  Administered 2015-08-13 – 2015-08-14 (×2): 100 mg via INTRAVENOUS
  Filled 2015-08-13 (×3): qty 50

## 2015-08-13 MED ORDER — FAMOTIDINE IN NACL 20-0.9 MG/50ML-% IV SOLN
20.0000 mg | Freq: Two times a day (BID) | INTRAVENOUS | Status: DC
  Administered 2015-08-13 – 2015-08-14 (×3): 20 mg via INTRAVENOUS
  Filled 2015-08-13 (×8): qty 50

## 2015-08-13 NOTE — Progress Notes (Signed)
Peripherally Inserted Central Catheter/Midline Placement  The IV Nurse has discussed with the patient and/or persons authorized to consent for the patient, the purpose of this procedure and the potential benefits and risks involved with this procedure.  The benefits include less needle sticks, lab draws from the catheter and patient may be discharged home with the catheter.  Risks include, but not limited to, infection, bleeding, blood clot (thrombus formation), and puncture of an artery; nerve damage and irregular heat beat.  Alternatives to this procedure were also discussed.  PICC/Midline Placement Documentation        Jenna [name] 08/13/2015, 3:36 PM

## 2015-08-13 NOTE — Progress Notes (Signed)
Patient Demographics  Jenna [name], is a 60 y.o. female, DOB - 1955-08-14, ZOX:[id number]  Admit date - 08/09/2015   Admitting Physician Alberteen Sam, MD  Outpatient Primary MD for the patient is Mickie Hillier, MD  LOS - 4   Chief Complaint  Patient presents with  . Chest Pain       Admission HPI/Brief narrative: 60 y.o. female with a past medical history significant for pAF on aspirin and hx of gastric ulcer from Excedrin with GIB in 2013 who presents with abdominal pain and vomiting, Further evaluation with a CT scan revealed a large paraesophageal hiatal hernia and a proximal gastric outlet obstruction. An NG tube was placed and it improved her symptoms, recent underwent EGD 1/20 by Dr. Elnoria Howard, unable to reduce hernia endoscopically, with recommendation for surgical repair, surgery on board. Subjective:   Jenna [name] today has, No headache, No chest pain, No abdominal pain, no vomiting.  Assessment & Plan    Principal Problem:   Gastric outflow obstruction Active Problems:   Atrial fibrillation (HCC)   Gastric out let obstruction  Nausea and vomiting secondary to gastric outlet obstruction from Paraesophageal hernia - Symptoms significantly subsided after NG tube insertion, CT abdomen pelvis suggest outlet obstruction from large bore esophageal hernia. - GI consult greatly appreciated, status post endoscopy 1/20, significant for Large paraesophageal hiatal hernia. And Mild Candidal esophagitis, GI was Unable to reduce endoscopically. They recommended surgical repair of hernia, surgery on board, very likely she'll need laparoscopic repair - Continue with IV fluid as remains nothing by mouth. - May need TPN as in 5 days without oral intake, all depends on timing of surgery.  Candidal esophagitis - We'll treat with IV fluconazole 100 mg 3 days  Paroxysmal atrial  fibrillation - CHADS2Vasc 1, resume aspirin when able to take oral intake.  Hypokalemia - Repleted , recheck in a.m.  Code Status: full  Family Communication: None at bedside  Disposition Plan: Pending further workup   Procedures  Endoscopy 08/10/2015   Consults   Gen. Surgery Gastroenterology   Medications  Scheduled Meds: . enoxaparin (LOVENOX) injection  40 mg Subcutaneous Daily  . famotidine (PEPCID) IV  20 mg Intravenous Q12H  . fluconazole (DIFLUCAN) IV  100 mg Intravenous Q24H   Continuous Infusions: . dextrose 5 % and 0.45 % NaCl with KCl 20 mEq/L 75 mL/hr at 08/12/15 1651  . lactated ringers Stopped (08/10/15 1247)   PRN Meds:.acetaminophen **OR** acetaminophen, HYDROmorphone (DILAUDID) injection, ondansetron **OR** ondansetron (ZOFRAN) IV  DVT Prophylaxis  Lovenox   Lab Results  Component Value Date   PLT 258 08/12/2015    Antibiotics    Anti-infectives    Start     Dose/Rate Route Frequency Ordered Stop   08/13/15 1230  fluconazole (DIFLUCAN) IVPB 100 mg     100 mg 50 mL/hr over 60 Minutes Intravenous Every 24 hours 08/13/15 1227 08/16/15 1229          Objective:   Filed Vitals:   08/12/15 0411 08/12/15 1356 08/12/15 2228 08/13/15 0521  BP: 126/74 132/74 139/87 119/70  Pulse: 82 82 79 74  Temp: 97.7 F (36.5 C) 98 F (36.7 C) 97.8 F (36.6 C) 97.8 F (36.6 C)  TempSrc: Oral Oral Oral Oral  Resp: Height:      Weight:      SpO2: 94% 95% 98% 95%    Wt Readings from Last 3 Encounters:  08/10/15 74.844 kg (165 lb)  07/28/14 74.844 kg (165 lb)  08/22/13 77.111 kg (170 lb)    No intake or output data in the 24 hours ending 08/13/15 1227   Physical Exam  Awake Alert, Oriented X 3, No new F.N deficits, Normal affect [place].AT,PERRAL Supple Neck,No JVD, No cervical lymphadenopathy appriciated.  Symmetrical Chest wall movement, Good air movement bilaterally, CTAB RRR,No Gallops,Rubs or new Murmurs, No Parasternal  Heave +ve B.Sounds, Abd Soft, No tenderness, No organomegaly appriciated, No rebound - guarding or rigidity. No Cyanosis, Clubbing or edema, No new Rash or bruise    Data Review   Micro Results No results found for this or any previous visit (from the past 240 hour(s)).  Radiology Reports Dg Chest 2 View  08/09/2015  CLINICAL DATA:  Mid to left chest pain. EXAM: CHEST  2 VIEW COMPARISON:  07/28/2014. FINDINGS: Increased size of a large hiatal hernia containing an air-fluid level. Interval small amount of linear density at the left lung base. Otherwise, clear lungs. Unremarkable bones. IMPRESSION: 1. Increased size of a large hiatal hernia containing an air-fluid level. 2. Interval mild linear atelectasis or scarring at the left lung base. Electronically Signed   By: Beckie Salts M.D.   On: 08/09/2015 16:05   Ct Abdomen Pelvis W Contrast  08/09/2015  CLINICAL DATA:  Acute generalized abdominal pain. EXAM: CT ABDOMEN AND PELVIS WITH CONTRAST TECHNIQUE: Multidetector CT imaging of the abdomen and pelvis was performed using the standard protocol following bolus administration of intravenous contrast. CONTRAST:  OMNIPAQUE IOHEXOL 300 MG/ML  SOLN COMPARISON:  None. FINDINGS: Severe degenerative disc disease is noted at L1-2. Visualized lung bases are unremarkable. Large paraesophageal hiatal hernia is noted resulting in dilatation and possible obstruction of the more proximal stomach. Small solitary gallstone is noted. No significant abnormality is noted in the liver, spleen or pancreas. Adrenal glands are unremarkable. Parapelvic cysts are noted in left kidney. Otherwise kidneys appear normal. No hydronephrosis or renal obstruction is noted. The appendix appears normal. There is no evidence of large or small bowel obstruction. No abnormal fluid collection is noted. Sigmoid diverticulosis is noted without inflammation. Urinary bladder appears normal. Uterus and ovaries are unremarkable. No significant  adenopathy is noted. IMPRESSION: Large paraesophageal hiatal hernia is noted which results in dilatation and probable obstruction of the more proximal stomach. Sigmoid diverticulosis is noted without inflammation. Small solitary gallstone is noted. Electronically Signed   By: Lupita Raider, M.D.   On: 08/09/2015 21:25   US Abdomen Limited  08/09/2015  CLINICAL DATA:  60 year old female with acute right upper quadrant abdominal pain. EXAM: US ABDOMEN LIMITED - RIGHT UPPER QUADRANT COMPARISON:  None. FINDINGS: Gallbladder: A 6 x 9 x 9 mm nonmobile oval hypoechoic structure/polyp along the gallbladder wall is identified. At least 1 area along ring down artifact from the gallbladder wall is noted compatible with adenomyomatosis. There is no evidence of cholelithiasis or acute cholecystitis. Common bile duct: Diameter: 3 mm. Liver: No focal lesion identified. Within normal limits in parenchymal echogenicity. IMPRESSION: 6 x 9 x 9 mm gallbladder polyp. Ultrasound follow-up is recommended in 1 year. This recommendation follows ACR consensus guidelines: White Paper of the ACR Incidental Findings Committee II on Gallbladder and Biliary Findings. J Am Coll Radiol 2013:;10:953-956. Gallbladder adenomyomatosis. No other significant  abnormalities. No evidence of cholelithiasis or acute cholecystitis. Electronically Signed   By: Harmon Pier M.D.   On: 08/09/2015 18:18   Dg Kayleen Memos W/water Sol Cm  08/11/2015  CLINICAL DATA:  Hiatal hernia with obstruction. EXAM: UPPER GI SERIES WITH KUB TECHNIQUE: After obtaining a scout radiograph a routine upper GI series was performed using thin Gastrografin. FLUOROSCOPY TIME:  Fluoroscopy Time (in minutes and seconds): 1 minutes and 18 seconds COMPARISON:  CT abdomen and pelvis dated 08/09/2015. FINDINGS: Initial images showed slow contrast passage from the distal esophagus to the proximal stomach. 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 Associated tight narrowings noted just below the gastroesophageal junction and within the mid gastric body region. There were multiple episodes of reflux to the level of the mid esophagus, with subsequent development of tertiary contractions and dysmotility with to and fro flow of contrast within the distal esophagus. Patient experienced no emesis during the exam. Contrast did eventually pass to the pylorus after some delay. I was asked by the physician assistant to place a nasogastric tube if possible. Due to this configuration of the stomach, nasogastric tube placement was not attempted. IMPRESSION: Marked tortuosity of the stomach, commencing just below the level of the gastroesophageal junction, most suggestive of organoaxial gastric volvulus, with associated tight narrowings noted just below the level of the gastroesophageal junction and within the mid gastric body region. Contrast did eventually move through the stomach after some delay. Multiple episodes of associated reflux identified during the examination to the level of the mid esophagus, with subsequent development of tertiary contractions and esophageal dysmotility with "to and fro" flow of contrast in the distal esophagus. Findings discussed with the surgeon on call, Dr. Violeta Gelinas, at the time of the exam. Electronically Signed   By: Bary Richard M.D.   On: 08/11/2015 15:03     CBC  Recent Labs Lab 08/09/15 1550 08/10/15 0500 08/12/15 0840  WBC 7.1 10.7* 7.4  HGB 12.7 12.0 12.3  HCT 39.3 37.9 37.6  PLT 268 269 258  MCV 93.8 94.5 94.2  MCH 30.3 29.9 30.8  MCHC 32.3 31.7 32.7  RDW 12.2 12.6 12.5    Chemistries   Recent Labs Lab 08/09/15 1550 08/10/15 0500 08/11/15 0317 08/12/15 0840  NA 141 140 140 141  K 3.1* 3.3* 3.2* 3.4*  CL 105 104 109 109  CO2 GLUCOSE 109* 140* 112* 108*  BUN 12 7 <5* <5*  CREATININE 0.61 0.63 0.60  0.64  CALCIUM 9.3 9.2 8.9 9.2  MG  --   --  2.1  --   AST 24 21  --   --   ALT 13* 12*  --   --   ALKPHOS 73 69  --   --   BILITOT 0.6 1.1  --   --    ------------------------------------------------------------------------------------------------------------------ estimated creatinine clearance is 73.1 mL/min (by C-G formula based on Cr of 0.64). ------------------------------------------------------------------------------------------------------------------ No results for input(s): HGBA1C in the last 72 hours. ------------------------------------------------------------------------------------------------------------------ No results for input(s): CHOL, HDL, LDLCALC, TRIG, CHOLHDL, LDLDIRECT in the last 72 hours. ------------------------------------------------------------------------------------------------------------------ No results for input(s): TSH, T4TOTAL, T3FREE, THYROIDAB in the last 72 hours.  Invalid input(s): FREET3 ------------------------------------------------------------------------------------------------------------------ No results for input(s): VITAMINB12, FOLATE, FERRITIN, TIBC, IRON, RETICCTPCT in the last 72 hours.  Coagulation profile No results for input(s): INR, PROTIME in the last 168 hours.  No results for  input(s): DDIMER in the last 72 hours.  Cardiac Enzymes  Recent Labs Lab 08/09/15 1550  TROPONINI <0.03   ------------------------------------------------------------------------------------------------------------------ Invalid input(s): POCBNP     Time Spent in minutes   15 minutes   Rhyanna Sorce M.D on 08/13/2015 at 12:27 PM  Between 7am to 7pm - Pager - [phone]  After 7pm go to [email or web address] - password [facility]  [facility]   Office  [phone]

## 2015-08-13 NOTE — Progress Notes (Addendum)
Initial Nutrition Assessment  DOCUMENTATION CODES:   Not applicable  INTERVENTION:  TPN per Pharmacy.  RD to continue to monitor.   NUTRITION DIAGNOSIS:   Inadequate oral intake related to inability to eat as evidenced by NPO status.  GOAL:   Patient will meet greater than or equal to 90% of their needs  MONITOR:   Weight trends, Labs, Skin, I & O's, TPN tolerance REASON FOR ASSESSMENT:   Consult New TPN/TNA  ASSESSMENT:   60 y.o. female with a past medical history significant for pAF on aspirin and hx of gastric ulcer from Excedrin with GIB in 2013 who presents with abdominal pain and vomiting, Further evaluation with a CT scan revealed a large paraesophageal hiatal hernia and a proximal gastric outlet obstruction. An NG tube was placed and it improved her symptoms, recent underwent EGD 1/20 by Dr. Elnoria Howard, unable to reduce hernia endoscopically, with recommendation for surgical repair  Plans to start TPN tomorrow evening, per Pharmacy. Pt was unavailable during time of visit. Pt has been NPO since admission. Per MD note, pt with no oral intake over the past 5 days. Pt was in normal health prior to abdominal pain and current admission. Per Epic weight records, weight has been stable. RD to continue to monitor.   Unable to complete Nutrition-Focused physical exam at this time.   Labs and medications reviewed. Low potassium and BUN.  Diet Order:  Diet NPO time specified Except for: Ice Chips  Skin:  Reviewed, no issues  Last BM:  1/19  Height:   Ht Readings from Last 1 Encounters:  08/10/15  (1.626 m)    Weight:   Wt Readings from Last 1 Encounters:  08/10/15 165 lb (74.844 kg)    Ideal Body Weight:  54.5 kg  BMI:  Body mass index is 28.31 kg/(m^2).  Estimated Nutritional Needs:   Kcal:  1850-2050  Protein:  90-100 grams  Fluid:  1.8-2 L/day  EDUCATION NEEDS:   No education needs identified at this time  Roslyn Smiling, MS, RD, LDN Pager #  [phone] After hours/ weekend pager # [phone]

## 2015-08-13 NOTE — Progress Notes (Signed)
PARENTERAL NUTRITION CONSULT NOTE - INITIAL  Pharmacy Consult for TPN Indication: Paraesophageal hernia   Pharmacy consulted to start TPN on 59 YOF with abdominal pain and vomiting found to have a large paraesophageal hiatal hernia and a proximal gastric outlet obstruction. Planning surgical repair. Unable to start TPN today due to timing of order. Will order TPN labs, consult dietician with plan to start TPN tomorrow evening.   Vinnie Level, PharmD., BCPS Clinical Pharmacist Pager [phone]

## 2015-08-13 NOTE — Progress Notes (Signed)
3 Days Post-Op  Subjective: She is having no pain, no nausea, just hungry.  NO NG was placed.  It could not be done in IR.    Objective: Vital signs in last 24 hours: Temp:  [97.8 F (36.6 C)-98 F (36.7 C)] 97.8 F (36.6 C) (01/23 0521) Pulse Rate:  [74-82] 74 (01/23 0521) Resp:  [16] 16 (01/23 0521) BP: (119-139)/(70-87) 119/70 mmHg (01/23 0521) SpO2:  [95 %-98 %] 95 % (01/23 0521) Last BM Date: 08/09/15 Nothing recorded on I/O Afebrile, VSS K+ 3.4, WBC is normal  Intake/Output from previous day:   Intake/Output this shift:    General appearance: alert, cooperative and no distress,  Just hungry. GI: soft, non-tender; bowel sounds normal; no masses,  no organomegaly  Lab Results:   Recent Labs  08/12/15 0840  WBC 7.4  HGB 12.3  HCT 37.6  PLT 258    BMET  Recent Labs  08/11/15 0317 08/12/15 0840  NA 140 141  K 3.2* 3.4*  CL 109 109  CO2 25 25  GLUCOSE 112* 108*  BUN <5* <5*  CREATININE 0.60 0.64  CALCIUM 8.9 9.2   PT/INR No results for input(s): LABPROT, INR in the last 72 hours.   Recent Labs Lab 08/09/15 1550 08/10/15 0500  AST 24 21  ALT 13* 12*  ALKPHOS 73 69  BILITOT 0.6 1.1  PROT 7.2 5.9*  ALBUMIN 4.2 3.6     Lipase     Component Value Date/Time   LIPASE 39 08/09/2015 1550     Studies/Results: Dg Ugi W/water Sol Cm  08/11/2015  CLINICAL DATA:  Hiatal hernia with obstruction. EXAM: UPPER GI SERIES WITH KUB TECHNIQUE: After obtaining a scout radiograph a routine upper GI series was performed using thin Gastrografin. FLUOROSCOPY TIME:  Fluoroscopy Time (in minutes and seconds): 1 minutes and 18 seconds COMPARISON:  CT abdomen and pelvis dated 08/09/2015. FINDINGS: Initial images showed slow contrast passage from the distal esophagus to the proximal stomach. 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 Associated tight  narrowings noted just below the gastroesophageal junction and within the mid gastric body region. There were multiple episodes of reflux to the level of the mid esophagus, with subsequent development of tertiary contractions and dysmotility with to and fro flow of contrast within the distal esophagus. Patient experienced no emesis during the exam. Contrast did eventually pass to the pylorus after some delay. I was asked by the physician assistant to place a nasogastric tube if possible. Due to this configuration of the stomach, nasogastric tube placement was not attempted. IMPRESSION: Marked tortuosity of the stomach, commencing just below the level of the gastroesophageal junction, most suggestive of organoaxial gastric volvulus, with associated tight narrowings noted just below the level of the gastroesophageal junction and within the mid gastric body region. Contrast did eventually move through the stomach after some delay. Multiple episodes of associated reflux identified during the examination to the level of the mid esophagus, with subsequent development of tertiary contractions and esophageal dysmotility with "to and fro" flow of contrast in the distal esophagus. Findings discussed with the surgeon on call, Dr. Violeta Gelinas, at the time of the exam. Electronically Signed   By: Bary Richard M.D.   On: 08/11/2015 15:03    Medications: . enoxaparin (LOVENOX) injection  40 mg Subcutaneous Daily   . dextrose 5 % and 0.45 % NaCl with KCl 20 mEq/L 75  mL/hr at 08/12/15 1651  . lactated ringers Stopped (08/10/15 1247)   Prior to Admission medications   Medication Sig Start Date End Date Taking? Authorizing Provider  aspirin 81 MG tablet Take 81 mg by mouth 2 (two) times daily.    Yes Historical Provider, MD  Cyanocobalamin (B-12) 3000 MCG SUBL Place 3,000 mcg under the tongue daily.   Yes Historical Provider, MD  fexofenadine-pseudoephedrine (ALLEGRA-D) 60-120 MG per tablet Take 1 tablet by mouth daily  as needed (for allergies). Patient is using this medication for allergies.   Yes Historical Provider, MD  fluticasone (FLONASE) 50 MCG/ACT nasal spray Place 2 sprays into the nose daily as needed for allergies.    Yes Historical Provider, MD  Omega-3 Fatty Acids (FISH OIL PO) Take 2 capsules by mouth daily.    Yes Historical Provider, MD  ferrous sulfate 325 (65 FE) MG tablet Take 1 tablet (325 mg total) by mouth 2 (two) times daily with a meal. 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 Unable to reduce endoscopically, Mild Candidal esophagitis. Dr. Elnoria Howard 08/10/15. PAF (CHADS2Vasc 1, resume aspirin when able to take oral intake) Antibiotics: none DVT: lovenox  Plan:  We need to get someone who can reduce and fix this hernia laparoscopically.  We are working on that.  I will discuss PICC line and TNA with Dr. Magnus Ivan.  She has not eaten for 5 day.  I think allot of our nutrition planning will depend on when we can get her done.  Will transfer to [facility] MD, she is pretty healthy.  Check prealbumin.       LOS: 4 days    Jenna [name] 08/13/2015

## 2015-08-14 ENCOUNTER — Inpatient Hospital Stay (HOSPITAL_COMMUNITY): Payer: BLUE CROSS/BLUE SHIELD

## 2015-08-14 DIAGNOSIS — R002 Palpitations: Secondary | ICD-10-CM

## 2015-08-14 LAB — COMPREHENSIVE METABOLIC PANEL
ALK PHOS: 67 U/L (ref 38–126)
ALT: 13 U/L — ABNORMAL LOW (ref 14–54)
ANION GAP: 8 (ref 5–15)
AST: 19 U/L (ref 15–41)
Albumin: 3.4 g/dL — ABNORMAL LOW (ref 3.5–5.0)
BILIRUBIN TOTAL: 1 mg/dL (ref 0.3–1.2)
BUN: 7 mg/dL (ref 6–20)
CALCIUM: 9.3 mg/dL (ref 8.9–10.3)
CO2: 25 mmol/L (ref 22–32)
Chloride: 109 mmol/L (ref 101–111)
Creatinine, Ser: 0.66 mg/dL (ref 0.44–1.00)
GFR calc non Af Amer: >60 mL/min (ref >60)
Glucose, Bld: 94 mg/dL (ref 65–99)
Potassium: 3.6 mmol/L (ref 3.5–5.1)
SODIUM: 142 mmol/L (ref 135–145)
TOTAL PROTEIN: 6.1 g/dL — AB (ref 6.5–8.1)

## 2015-08-14 LAB — CBC
HCT: 37.2 % (ref 36.0–46.0)
Hemoglobin: 12.5 g/dL (ref 12.0–15.0)
MCH: 31.2 pg (ref 26.0–34.0)
MCHC: 33.6 g/dL (ref 30.0–36.0)
MCV: 92.8 fL (ref 78.0–100.0)
PLATELETS: 252 10*3/uL (ref 150–400)
RBC: 4.01 MIL/uL (ref 3.87–5.11)
RDW: 12.4 % (ref 11.5–15.5)
WBC: 6.5 10*3/uL (ref 4.0–10.5)

## 2015-08-14 LAB — DIFFERENTIAL
BASOS ABS: 0 10*3/uL (ref 0.0–0.1)
BASOS PCT: 0 %
EOS ABS: 0.2 10*3/uL (ref 0.0–0.7)
Eosinophils Relative: 3 %
Lymphocytes Relative: 30 %
Lymphs Abs: 1.9 10*3/uL (ref 0.7–4.0)
Monocytes Absolute: 0.4 10*3/uL (ref 0.1–1.0)
Monocytes Relative: 7 %
NEUTROS PCT: 60 %
Neutro Abs: 3.9 10*3/uL (ref 1.7–7.7)

## 2015-08-14 LAB — MAGNESIUM: MAGNESIUM: 2 mg/dL (ref 1.7–2.4)

## 2015-08-14 LAB — TRIGLYCERIDES: Triglycerides: 138 mg/dL (ref <150)

## 2015-08-14 LAB — GLUCOSE, CAPILLARY: GLUCOSE-CAPILLARY: 100 mg/dL — AB (ref 65–99)

## 2015-08-14 LAB — PREALBUMIN: Prealbumin: 25.1 mg/dL (ref 18–38)

## 2015-08-14 LAB — PHOSPHORUS: PHOSPHORUS: 4.6 mg/dL (ref 2.5–4.6)

## 2015-08-14 MED ORDER — CEFAZOLIN SODIUM 1-5 GM-% IV SOLN
1.0000 g | INTRAVENOUS | Status: AC
  Administered 2015-08-15: 2 g via INTRAVENOUS
  Filled 2015-08-14: qty 50

## 2015-08-14 MED ORDER — TRACE MINERALS CR-CU-MN-SE-ZN 10-1000-500-60 MCG/ML IV SOLN
INTRAVENOUS | Status: AC
  Administered 2015-08-14: 18:00:00 via INTRAVENOUS
  Filled 2015-08-14: qty 960

## 2015-08-14 MED ORDER — FLUCONAZOLE IN SODIUM CHLORIDE 200-0.9 MG/100ML-% IV SOLN
200.0000 mg | INTRAVENOUS | Status: DC
  Administered 2015-08-16 – 2015-08-17 (×2): 200 mg via INTRAVENOUS
  Filled 2015-08-14 (×3): qty 100

## 2015-08-14 MED ORDER — FAT EMULSION 20 % IV EMUL
240.0000 mL | INTRAVENOUS | Status: AC
  Administered 2015-08-14: 240 mL via INTRAVENOUS
  Filled 2015-08-14: qty 250

## 2015-08-14 MED ORDER — INSULIN ASPART 100 UNIT/ML ~~LOC~~ SOLN
0.0000 [IU] | SUBCUTANEOUS | Status: DC
  Administered 2015-08-15: 1 [IU] via SUBCUTANEOUS
  Administered 2015-08-15: 2 [IU] via SUBCUTANEOUS
  Administered 2015-08-16 (×3): 1 [IU] via SUBCUTANEOUS

## 2015-08-14 MED ORDER — KCL IN DEXTROSE-NACL 20-5-0.45 MEQ/L-%-% IV SOLN
INTRAVENOUS | Status: AC
  Filled 2015-08-14: qty 1000

## 2015-08-14 NOTE — Progress Notes (Signed)
*  PRELIMINARY RESULTS* Vascular Ultrasound Carotid Duplex (Doppler) has been completed.  Preliminary findings: Bilateral: No significant ICA stenosis. Antegrade vertebral flow.   Farrel Demark, RDMS, RVT  08/14/2015, 6:39 PM

## 2015-08-14 NOTE — Progress Notes (Signed)
4 Days Post-Op  Subjective: Pt with no acute changes  Objective: Vital signs in last 24 hours: Temp:  [98.1 F (36.7 C)-98.3 F (36.8 C)] 98.1 F (36.7 C) (01/24 0552) Pulse Rate:  [72-81] 72 (01/24 0552) Resp:  [16-20] 19 (01/24 0552) BP: (128-134)/(73-89) 128/73 mmHg (01/24 0552) SpO2:  [98 %-100 %] 98 % (01/24 0552) Last BM Date: 08/13/15  Intake/Output from previous day: 01/23 0701 - 01/24 0700 In: 1000 [I.V.:900; IV Piggyback:100] Out: -  Intake/Output this shift:    General appearance: alert and cooperative GI: soft, non-tender; bowel sounds normal; no masses,  no organomegaly  Lab Results:   Recent Labs  08/12/15 0840 08/14/15 0500  WBC 7.4 6.5  HGB 12.3 12.5  HCT 37.6 37.2  PLT 258 252   BMET  Recent Labs  08/12/15 0840 08/14/15 0500  NA 141 142  K 3.4* 3.6  CL 109 109  CO2 25 25  GLUCOSE 108* 94  BUN <5* 7  CREATININE 0.64 0.66  CALCIUM 9.2 9.3   PT/INR No results for input(s): LABPROT, INR in the last 72 hours. ABG No results for input(s): PHART, HCO3 in the last 72 hours.  Invalid input(s): PCO2, PO2  Studies/Results: No results found.  Anti-infectives: Anti-infectives    Start     Dose/Rate Route Frequency Ordered Stop   08/13/15 1300  fluconazole (DIFLUCAN) IVPB 100 mg     100 mg 50 mL/hr over 60 Minutes Intravenous Every 24 hours 08/13/15 1227 08/16/15 1259      Assessment/Plan: 60 y/o F with type II/III paraesophageal hernia Will plan on Lap hiatal hernia repair and nissen fundo with mesh I d/w the patient the possible risks of the surgery to include but not limited to: infection, bleeding, damage to surrounding structure, possible pneumothorax, possible need for further surgery.  The patient voiced understanding and wishes to proceed.   LOS: 5 days    Marigene Ehlers., [facility] 08/14/2015

## 2015-08-14 NOTE — Progress Notes (Signed)
Patient Demographics  Jenna [name], is a 60 y.o. female, DOB - 06-Apr-1956, OZH:[id number]  Admit date - 08/09/2015   Admitting Physician Alberteen Sam, MD  Outpatient Primary MD for the patient is Mickie Hillier, MD  LOS - 5   Chief Complaint  Patient presents with  . Chest Pain       Admission HPI/Brief narrative: 60 y.o. female with a past medical history significant for PAF on aspirin and hx of gastric ulcer from Excedrin with GIB in 2013 who presents with abdominal pain and vomiting, Further evaluation with a CT scan revealed a large paraesophageal hiatal hernia and a proximal gastric outlet obstruction. An NG tube was placed and it improved her symptoms, recent underwent EGD 1/20 by Dr. Elnoria Howard, unable to reduce hernia endoscopically, with recommendation for surgical repair, surgery on board. Subjective:   [name] today has, No headache, No chest pain, No abdominal pain, no vomiting.  Assessment & Plan    Principal Problem:   Gastric outflow obstruction Active Problems:   Atrial fibrillation (HCC)   Gastric out let obstruction  Nausea and vomiting secondary to gastric outlet obstruction from Paraesophageal hernia - Symptoms significantly subsided after NG tube insertion, CT abdomen pelvis suggest outlet obstruction from large bore esophageal hernia. - GI consult greatly appreciated, status post endoscopy 1/20, significant for Large paraesophageal hiatal hernia. And Mild Candidal esophagitis, GI was Unable to reduce endoscopically. They recommended surgical repair of hernia, surgery on board,  she'll need laparoscopic repair, plan for surgery tomorrow. - Continue with IV fluid as remains nothing by mouth. - will need TPN as in 6 days without oral intake, pharmacy consulted  Candidal esophagitis - We'll treat with IV fluconazole , will need total of 14 days, transition to  po when able to tolerate oral.  Paroxysmal atrial fibrillation - CHADS2Vasc 1, resume aspirin when able to take oral intake.  Hypokalemia - Repleted , recheck in a.m.  Code Status: full  Family Communication: Husband at bedside  Disposition Plan: Pending further workup   Procedures  Endoscopy 08/10/2015   Consults   Gen. Surgery Gastroenterology   Medications  Scheduled Meds: . [START ON 08/15/2015]  ceFAZolin (ANCEF) IV  1 g Intravenous To SS-Surg  . enoxaparin (LOVENOX) injection  40 mg Subcutaneous Daily  . famotidine (PEPCID) IV  20 mg Intravenous Q12H  . [START ON 08/15/2015] fluconazole (DIFLUCAN) IV  200 mg Intravenous Q24H  . insulin aspart  0-9 Units Subcutaneous 6 times per day   Continuous Infusions: . dextrose 5 % and 0.45 % NaCl with KCl 20 mEq/L 75 mL/hr at 08/13/15 1955  . dextrose 5 % and 0.45 % NaCl with KCl 20 mEq/L    . Marland KitchenTPN (CLINIMIX-E) Adult     And  . fat emulsion    . lactated ringers Stopped (08/10/15 1247)   PRN Meds:.acetaminophen **OR** acetaminophen, HYDROmorphone (DILAUDID) injection, ondansetron **OR** ondansetron (ZOFRAN) IV, sodium chloride  DVT Prophylaxis  Lovenox   Lab Results  Component Value Date   PLT 252 08/14/2015    Antibiotics    Anti-infectives    Start     Dose/Rate Route Frequency Ordered Stop   08/15/15 1300  fluconazole (DIFLUCAN) IVPB 200 mg     200 mg  100 mL/hr over 60 Minutes Intravenous Every 24 hours 08/14/15 1644 08/27/15 1259   08/15/15 0600  ceFAZolin (ANCEF) IVPB 1 g/50 mL premix     1 g 100 mL/hr over 30 Minutes Intravenous To [facility] 08/14/15 0837 08/16/15 0600   08/13/15 1300  fluconazole (DIFLUCAN) IVPB 100 mg  Status:  Discontinued     100 mg 50 mL/hr over 60 Minutes Intravenous Every 24 hours 08/13/15 1227 08/14/15 1644          Objective:   Filed Vitals:   08/13/15 1400 08/13/15 2053 08/14/15 0552 08/14/15 1450  BP: 128/89 134/82 128/73 112/72  Pulse: 78 81 72 88  Temp:  98.2 F (36.8 C) 98.3 F (36.8 C) 98.1 F (36.7 C) 98 F (36.7 C)  TempSrc:  Oral Oral   Resp: 16 20 19 18   Height:      Weight:      SpO2: 99% 100% 98% 99%    Wt Readings from Last 3 Encounters:  08/10/15 74.844 kg (165 lb)  07/28/14 74.844 kg (165 lb)  08/22/13 77.111 kg (170 lb)     Intake/Output Summary (Last 24 hours) at 08/14/15 1645 Last data filed at 08/14/15 1610  Gross per 24 hour  Intake   1000 ml  Output      0 ml  Net   1000 ml     Physical Exam  Awake Alert, Oriented X 3, No new F.N deficits, Normal affect [place].AT,PERRAL Supple Neck,No JVD, No cervical lymphadenopathy appriciated.  Symmetrical Chest wall movement, Good air movement bilaterally, CTAB RRR,No Gallops,Rubs or new Murmurs, No Parasternal Heave +ve B.Sounds, Abd Soft, No tenderness, No organomegaly appriciated, No rebound - guarding or rigidity. No Cyanosis, Clubbing or edema, No new Rash or bruise    Data Review   Micro Results No results found for this or any previous visit (from the past 240 hour(s)).  Radiology Reports Dg Chest 2 View  08/09/2015  CLINICAL DATA:  Mid to left chest pain. EXAM: CHEST  2 VIEW COMPARISON:  07/28/2014. FINDINGS: Increased size of a large hiatal hernia containing an air-fluid level. Interval small amount of linear density at the left lung base. Otherwise, clear lungs. Unremarkable bones. IMPRESSION: 1. Increased size of a large hiatal hernia containing an air-fluid level. 2. Interval mild linear atelectasis or scarring at the left lung base. Electronically Signed   By: Beckie Salts M.D.   On: 08/09/2015 16:05   Ct Abdomen Pelvis W Contrast  08/09/2015  CLINICAL DATA:  Acute generalized abdominal pain. EXAM: CT ABDOMEN AND PELVIS WITH CONTRAST TECHNIQUE: Multidetector CT imaging of the abdomen and pelvis was performed using the standard protocol following bolus administration of intravenous contrast. CONTRAST:  OMNIPAQUE IOHEXOL 300 MG/ML  SOLN COMPARISON:   None. FINDINGS: Severe degenerative disc disease is noted at L1-2. Visualized lung bases are unremarkable. Large paraesophageal hiatal hernia is noted resulting in dilatation and possible obstruction of the more proximal stomach. Small solitary gallstone is noted. No significant abnormality is noted in the liver, spleen or pancreas. Adrenal glands are unremarkable. Parapelvic cysts are noted in left kidney. Otherwise kidneys appear normal. No hydronephrosis or renal obstruction is noted. The appendix appears normal. There is no evidence of large or small bowel obstruction. No abnormal fluid collection is noted. Sigmoid diverticulosis is noted without inflammation. Urinary bladder appears normal. Uterus and ovaries are unremarkable. No significant adenopathy is noted. IMPRESSION: Large paraesophageal hiatal hernia is noted which results in dilatation and probable obstruction of  the more proximal stomach. Sigmoid diverticulosis is noted without inflammation. Small solitary gallstone is noted. Electronically Signed   By: Lupita Raider, M.D.   On: 08/09/2015 21:25   US Abdomen Limited  08/09/2015  CLINICAL DATA:  60 year old female with acute right upper quadrant abdominal pain. EXAM: US ABDOMEN LIMITED - RIGHT UPPER QUADRANT COMPARISON:  None. FINDINGS: Gallbladder: A 6 x 9 x 9 mm nonmobile oval hypoechoic structure/polyp along the gallbladder wall is identified. At least 1 area along ring down artifact from the gallbladder wall is noted compatible with adenomyomatosis. There is no evidence of cholelithiasis or acute cholecystitis. Common bile duct: Diameter: 3 mm. Liver: No focal lesion identified. Within normal limits in parenchymal echogenicity. IMPRESSION: 6 x 9 x 9 mm gallbladder polyp. Ultrasound follow-up is recommended in 1 year. This recommendation follows ACR consensus guidelines: White Paper of the ACR Incidental Findings Committee II on Gallbladder and Biliary Findings. J Am Coll Radiol  2013:;10:953-956. Gallbladder adenomyomatosis. No other significant abnormalities. No evidence of cholelithiasis or acute cholecystitis. Electronically Signed   By: Harmon Pier M.D.   On: 08/09/2015 18:18   Dg Kayleen Memos W/water Sol Cm  08/11/2015  CLINICAL DATA:  Hiatal hernia with obstruction. EXAM: UPPER GI SERIES WITH KUB TECHNIQUE: After obtaining a scout radiograph a routine upper GI series was performed using thin Gastrografin. FLUOROSCOPY TIME:  Fluoroscopy Time (in minutes and seconds): 1 minutes and 18 seconds COMPARISON:  CT abdomen and pelvis dated 08/09/2015. FINDINGS: Initial images showed slow contrast passage from the distal esophagus to the proximal stomach. 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 Associated tight narrowings noted just below the gastroesophageal junction and within the mid gastric body region. There were multiple episodes of reflux to the level of the mid esophagus, with subsequent development of tertiary contractions and dysmotility with to and fro flow of contrast within the distal esophagus. Patient experienced no emesis during the exam. Contrast did eventually pass to the pylorus after some delay. I was asked by the physician assistant to place a nasogastric tube if possible. Due to this configuration of the stomach, nasogastric tube placement was not attempted. IMPRESSION: Marked tortuosity of the stomach, commencing just below the level of the gastroesophageal junction, most suggestive of organoaxial gastric volvulus, with associated tight narrowings noted just below the level of the gastroesophageal junction and within the mid gastric body region. Contrast did eventually move through the stomach after some delay. Multiple episodes of associated reflux identified during the examination to the level of the mid esophagus, with subsequent development of tertiary  contractions and esophageal dysmotility with "to and fro" flow of contrast in the distal esophagus. Findings discussed with the surgeon on call, Dr. Violeta Gelinas, at the time of the exam. Electronically Signed   By: Bary Richard M.D.   On: 08/11/2015 15:03     CBC  Recent Labs Lab 08/09/15 1550 08/10/15 0500 08/12/15 0840 08/14/15 0500  WBC 7.1 10.7* 7.4 6.5  HGB 12.7 12.0 12.3 12.5  HCT 39.3 37.9 37.6 37.2  PLT 268 269 258 252  MCV 93.8 94.5 94.2 92.8  MCH 30.3 29.9 30.8 31.2  MCHC 32.3 31.7 32.7 33.6  RDW 12.2 12.6 12.5 12.4  LYMPHSABS  --   --   --  1.9  MONOABS  --   --   --  0.4  EOSABS  --   --   --  0.2  BASOSABS  --   --   --  0.0    Chemistries   Recent Labs Lab 08/09/15 1550 08/10/15 0500 08/11/15 0317 08/12/15 0840 08/14/15 0500  NA 141 140 140 141 142  K 3.1* 3.3* 3.2* 3.4* 3.6  CL 105 104 109 109 109  CO2 GLUCOSE 109* 140* 112* 108* 94  BUN 12 7 <5* <5* 7  CREATININE 0.61 0.63 0.60 0.64 0.66  CALCIUM 9.3 9.2 8.9 9.2 9.3  MG  --   --  2.1  --  2.0  AST 24 21  --   --  19  ALT 13* 12*  --   --  13*  ALKPHOS 73 69  --   --  67  BILITOT 0.6 1.1  --   --  1.0   ------------------------------------------------------------------------------------------------------------------ estimated creatinine clearance is 73.1 mL/min (by C-G formula based on Cr of 0.66). ------------------------------------------------------------------------------------------------------------------ No results for input(s): HGBA1C in the last 72 hours. ------------------------------------------------------------------------------------------------------------------  Recent Labs  08/14/15 0500  TRIG 138   ------------------------------------------------------------------------------------------------------------------ No results for input(s): TSH, T4TOTAL, T3FREE, THYROIDAB in the last 72 hours.  Invalid input(s):  FREET3 ------------------------------------------------------------------------------------------------------------------ No results for input(s): VITAMINB12, FOLATE, FERRITIN, TIBC, IRON, RETICCTPCT in the last 72 hours.  Coagulation profile No results for input(s): INR, PROTIME in the last 168 hours.  No results for input(s): DDIMER in the last 72 hours.  Cardiac Enzymes  Recent Labs Lab 08/09/15 1550  TROPONINI <0.03   ------------------------------------------------------------------------------------------------------------------ Invalid input(s): POCBNP     Time Spent in minutes   20 minutes   Icelyn Navarrete M.D on 08/14/2015 at 4:45 PM  Between 7am to 7pm - Pager - [phone]  After 7pm go to [email or web address] - password [facility]  [facility]   Office  [phone]

## 2015-08-14 NOTE — Progress Notes (Signed)
PARENTERAL NUTRITION CONSULT NOTE - INITIAL  Pharmacy Consult for TPN Indication: Paraesophageal hernia   Allergies  Allergen Reactions  . Bactrim [Sulfamethoxazole-Trimethoprim] Other (See Comments)    Canker sores in her mouth.  . Adhesive [Tape] Rash and Other (See Comments)    Burns to the skin  . Latex Rash and Other (See Comments)    Burns to skin    Patient Measurements: Height:  (162.6 cm) Weight: 165 lb (74.844 kg) IBW/kg (Calculated) : 54.7 Adjusted Body Weight: 60 kg  Usual Weight: 75 kg   Vital Signs: Temp: 98.1 F (36.7 C) (01/24 0552) Temp Source: Oral (01/24 0552) BP: 128/73 mmHg (01/24 0552) Pulse Rate: 72 (01/24 0552) Intake/Output from previous day: 01/23 0701 - 01/24 0700 In: 1000 [I.V.:900; IV Piggyback:100] Out: -  Intake/Output from this shift:    Labs:  Recent Labs  08/12/15 0840 08/14/15 0500  WBC 7.4 6.5  HGB 12.3 12.5  HCT 37.6 37.2  PLT 258 252     Recent Labs  08/12/15 0840 08/13/15 1720 08/14/15 0500  NA 141  --  142  K 3.4*  --  3.6  CL 109  --  109  CO2 25  --  25  GLUCOSE 108*  --  94  BUN <5*  --  7  CREATININE 0.64  --  0.66  CALCIUM 9.2  --  9.3  MG  --   --  2.0  PHOS  --   --  4.6  PROT  --   --  6.1*  ALBUMIN  --   --  3.4*  AST  --   --  19  ALT  --   --  13*  ALKPHOS  --   --  67  BILITOT  --   --  1.0  PREALBUMIN  --  28.8 25.1  TRIG  --   --  138   Estimated Creatinine Clearance: 73.1 mL/min (by C-G formula based on Cr of 0.66).   No results for input(s): GLUCAP in the last 72 hours.  Medical History: Past Medical History  Diagnosis Date  . Deviated nasal septum   . Colon polyp 2008    on last colonoscopy with Dr. Loreta Ave in 2008  . Hypercholesteremia   . Osteoarthritis   . Allergic rhinitis   . Vitamin B12 deficiency   . Diverticulosis 2008    on last colonoscopy with Dr. Loreta Ave in 2008  . Atrial fibrillation (HCC) 08/22/2013  . GERD (gastroesophageal reflux disease)   . Gastric ulcer  with hemorrhage   . Iron deficiency anemia     After ulcer in 2013, resolved    Medications:  Prescriptions prior to admission  Medication Sig Dispense Refill Last Dose  . aspirin 81 MG tablet Take 81 mg by mouth 2 (two) times daily.    08/09/2015 at Unknown time  . Cyanocobalamin (B-12) 3000 MCG SUBL Place 3,000 mcg under the tongue daily.   Past Week at Unknown time  . fexofenadine-pseudoephedrine (ALLEGRA-D) 60-120 MG per tablet Take 1 tablet by mouth daily as needed (for allergies). Patient is using this medication for allergies.   Past Week at Unknown time  . fluticasone (FLONASE) 50 MCG/ACT nasal spray Place 2 sprays into the nose daily as needed for allergies.    08/09/2015 at Unknown time  . Omega-3 Fatty Acids (FISH OIL PO) Take 2 capsules by mouth daily.    08/09/2015 at Unknown time  . ferrous sulfate 325 (65 FE) MG tablet  Take 1 tablet (325 mg total) by mouth 2 (two) times daily with a meal. 60 tablet 0 12/10/2012 at Unknown  . omeprazole (PRILOSEC) 20 MG capsule TAKE ONE CAPSULE BY MOUTH ONCE DAILY (Patient not taking: Reported on 08/10/2015) 30 capsule 0     Insulin Requirements in the past 24 hours:  None   Assessment: 89 YOF with abdominal pain and vomiting found to have a large paraesophageal hiatal hernia and a proximal gastric outlet obstruction. An NG tube was placed which improved symptoms and she underwent EGD on 1/20. Unable to reduce hernia endoscopically. Surgery was consulted and now planning surgical repair that can be done laparoscopically. Pharmacy consulted yesterday to start TPN since patient has has no oral intake in 5 days and surgery date has not been set.   Surgeries/Procedures: 1/20: EGD  GI: H/o gastric ulcer. Large paraesophageal hiatal hernia and a proximal gastric outlet obstruction. Lap hiatal hernia repair and nissen fundo with mesh planned for tomorrow   Endo: CBGs controlled  Lytes: Lytes wnl, Mg 2, Phos 4.6 Renal: SCr 0.66, CrCl ~ 70-75 mL/min,  D51/2NS @ 75  Pulm: 98% RA  Cards: H/o Afib, VSS, TG 138  Hepatobil: LFTs wnl. Prealbumin 25.1  Heme: H/H and Plt wnl  ID: WBC wnl, On IV fluconazole 100 mg x 3 days for candidal esophagitis  Best Practices: Lovenox  TPN Access: PICC 1/23>>  TPN start date: 1/24>>   Current Nutrition:  NPO    Nutritional Goals per RD:  Kcal: 1850-2050 Protein: 90-100 grams Fluid: 1.8-2 L/day  Plan:  -Start Clinimix E 5/15 @ 40 mL/hr and IVFE @ 10 mL/hr. This will provide 48 gm of protein and 1162 kCal. Advance to goal as tolerated  -Decrease D5 1/2NS rate to 35 mL/hr starting at 1800 today to accommodate TPN -Add MVI and TE to TPN bag -F/u TPN labs -Order sensitive SSI  -F/u plan after surgery tomorrow   Vinnie Level, PharmD., BCPS Clinical Pharmacist Pager [phone]

## 2015-08-15 ENCOUNTER — Inpatient Hospital Stay (HOSPITAL_COMMUNITY): Payer: BLUE CROSS/BLUE SHIELD | Admitting: Anesthesiology

## 2015-08-15 ENCOUNTER — Encounter (HOSPITAL_COMMUNITY): Admission: EM | Disposition: A | Discharge status: Home / Self Care | Payer: Self-pay | Source: Home / Self Care

## 2015-08-15 ENCOUNTER — Encounter (HOSPITAL_COMMUNITY): Payer: Self-pay | Admitting: *Deleted

## 2015-08-15 HISTORY — PX: LAPAROSCOPIC NISSEN FUNDOPLICATION: SHX1932

## 2015-08-15 HISTORY — PX: HIATAL HERNIA REPAIR: SHX195

## 2015-08-15 LAB — SURGICAL PCR SCREEN
MRSA, PCR: NEGATIVE
STAPHYLOCOCCUS AUREUS: NEGATIVE

## 2015-08-15 LAB — BASIC METABOLIC PANEL
ANION GAP: 13 (ref 5–15)
BUN: 9 mg/dL (ref 6–20)
CHLORIDE: 105 mmol/L (ref 101–111)
CO2: 22 mmol/L (ref 22–32)
Calcium: 9.4 mg/dL (ref 8.9–10.3)
Creatinine, Ser: 0.58 mg/dL (ref 0.44–1.00)
GFR calc non Af Amer: >60 mL/min (ref >60)
GLUCOSE: 121 mg/dL — AB (ref 65–99)
Potassium: 3.3 mmol/L — ABNORMAL LOW (ref 3.5–5.1)
Sodium: 140 mmol/L (ref 135–145)

## 2015-08-15 LAB — GLUCOSE, CAPILLARY
GLUCOSE-CAPILLARY: 130 mg/dL — AB (ref 65–99)
GLUCOSE-CAPILLARY: 155 mg/dL — AB (ref 65–99)
Glucose-Capillary: 101 mg/dL — ABNORMAL HIGH (ref 65–99)
Glucose-Capillary: 120 mg/dL — ABNORMAL HIGH (ref 65–99)

## 2015-08-15 LAB — PHOSPHORUS: PHOSPHORUS: 4.3 mg/dL (ref 2.5–4.6)

## 2015-08-15 LAB — MAGNESIUM: Magnesium: 2.1 mg/dL (ref 1.7–2.4)

## 2015-08-15 SURGERY — FUNDOPLICATION, NISSEN, LAPAROSCOPIC
Anesthesia: General | Site: Abdomen

## 2015-08-15 MED ORDER — PHENYLEPHRINE HCL 10 MG/ML IJ SOLN
10.0000 mg | INTRAVENOUS | Status: DC | PRN
  Administered 2015-08-15: 25 ug/min via INTRAVENOUS

## 2015-08-15 MED ORDER — ONDANSETRON HCL 4 MG/2ML IJ SOLN
INTRAMUSCULAR | Status: DC | PRN
  Administered 2015-08-15: 4 mg via INTRAVENOUS

## 2015-08-15 MED ORDER — PROPOFOL 10 MG/ML IV BOLUS
INTRAVENOUS | Status: DC | PRN
  Administered 2015-08-15: 170 mg via INTRAVENOUS

## 2015-08-15 MED ORDER — FAT EMULSION 20 % IV EMUL
240.0000 mL | INTRAVENOUS | Status: DC
  Administered 2015-08-15: 240 mL via INTRAVENOUS
  Filled 2015-08-15: qty 250

## 2015-08-15 MED ORDER — ALBUMIN HUMAN 5 % IV SOLN
INTRAVENOUS | Status: DC | PRN
  Administered 2015-08-15: 13:00:00 via INTRAVENOUS

## 2015-08-15 MED ORDER — PROMETHAZINE HCL 25 MG/ML IJ SOLN: 6.2500 mg | INTRAMUSCULAR | Status: DC | PRN

## 2015-08-15 MED ORDER — KCL IN DEXTROSE-NACL 20-5-0.45 MEQ/L-%-% IV SOLN
INTRAVENOUS | Status: DC
  Administered 2015-08-16: 06:00:00 via INTRAVENOUS
  Filled 2015-08-15 (×2): qty 1000

## 2015-08-15 MED ORDER — ENOXAPARIN SODIUM 40 MG/0.4ML ~~LOC~~ SOLN
40.0000 mg | Freq: Every day | SUBCUTANEOUS | Status: DC
  Administered 2015-08-15 – 2015-08-16 (×2): 40 mg via SUBCUTANEOUS
  Filled 2015-08-15 (×2): qty 0.4

## 2015-08-15 MED ORDER — BUPIVACAINE HCL 0.25 % IJ SOLN
INTRAMUSCULAR | Status: DC | PRN
  Administered 2015-08-15: 6 mL

## 2015-08-15 MED ORDER — POTASSIUM CHLORIDE 10 MEQ/50ML IV SOLN
10.0000 meq | INTRAVENOUS | Status: AC
  Filled 2015-08-15 (×3): qty 50

## 2015-08-15 MED ORDER — MEPERIDINE HCL 25 MG/ML IJ SOLN: 6.2500 mg | INTRAMUSCULAR | Status: DC | PRN

## 2015-08-15 MED ORDER — EPHEDRINE SULFATE 50 MG/ML IJ SOLN
INTRAMUSCULAR | Status: DC | PRN
  Administered 2015-08-15: 10 mg via INTRAVENOUS

## 2015-08-15 MED ORDER — ROCURONIUM BROMIDE 100 MG/10ML IV SOLN
INTRAVENOUS | Status: DC | PRN
  Administered 2015-08-15: 10 mg via INTRAVENOUS
  Administered 2015-08-15: 50 mg via INTRAVENOUS
  Administered 2015-08-15: 5 mg via INTRAVENOUS

## 2015-08-15 MED ORDER — FENTANYL CITRATE (PF) 250 MCG/5ML IJ SOLN
INTRAMUSCULAR | Status: AC
  Filled 2015-08-15: qty 5

## 2015-08-15 MED ORDER — ONDANSETRON HCL 4 MG/2ML IJ SOLN
INTRAMUSCULAR | Status: AC
  Filled 2015-08-15: qty 2

## 2015-08-15 MED ORDER — BUPIVACAINE-EPINEPHRINE (PF) 0.5% -1:200000 IJ SOLN
INTRAMUSCULAR | Status: AC
  Filled 2015-08-15: qty 30

## 2015-08-15 MED ORDER — MIDAZOLAM HCL 2 MG/2ML IJ SOLN
INTRAMUSCULAR | Status: AC
  Filled 2015-08-15: qty 2

## 2015-08-15 MED ORDER — HYDROMORPHONE HCL 1 MG/ML IJ SOLN
0.2500 mg | INTRAMUSCULAR | Status: DC | PRN
  Administered 2015-08-15 (×2): 0.5 mg via INTRAVENOUS

## 2015-08-15 MED ORDER — MIDAZOLAM HCL 5 MG/5ML IJ SOLN
INTRAMUSCULAR | Status: DC | PRN
  Administered 2015-08-15: 2 mg via INTRAVENOUS

## 2015-08-15 MED ORDER — FENTANYL CITRATE (PF) 100 MCG/2ML IJ SOLN
INTRAMUSCULAR | Status: DC | PRN
Start: 2015-08-15 — End: 2015-08-15
  Administered 2015-08-15 (×3): 25 ug via INTRAVENOUS
  Administered 2015-08-15: 50 ug via INTRAVENOUS
  Administered 2015-08-15 (×2): 25 ug via INTRAVENOUS
  Administered 2015-08-15: 100 ug via INTRAVENOUS
  Administered 2015-08-15: 75 ug via INTRAVENOUS

## 2015-08-15 MED ORDER — SUCCINYLCHOLINE CHLORIDE 20 MG/ML IJ SOLN
INTRAMUSCULAR | Status: DC | PRN
  Administered 2015-08-15: 80 mg via INTRAVENOUS

## 2015-08-15 MED ORDER — LIDOCAINE HCL (CARDIAC) 20 MG/ML IV SOLN
INTRAVENOUS | Status: AC
  Filled 2015-08-15: qty 5

## 2015-08-15 MED ORDER — SODIUM CHLORIDE 0.9 % IR SOLN
Status: DC | PRN
  Administered 2015-08-15: 1

## 2015-08-15 MED ORDER — NEOSTIGMINE METHYLSULFATE 10 MG/10ML IV SOLN
INTRAVENOUS | Status: DC | PRN
  Administered 2015-08-15: 4 mg via INTRAVENOUS

## 2015-08-15 MED ORDER — BUPIVACAINE HCL (PF) 0.25 % IJ SOLN
INTRAMUSCULAR | Status: AC
  Filled 2015-08-15: qty 30

## 2015-08-15 MED ORDER — PHENYLEPHRINE HCL 10 MG/ML IJ SOLN
INTRAMUSCULAR | Status: DC | PRN
  Administered 2015-08-15 (×3): 40 ug via INTRAVENOUS
  Administered 2015-08-15 (×2): 80 ug via INTRAVENOUS

## 2015-08-15 MED ORDER — TRACE MINERALS CR-CU-MN-SE-ZN 10-1000-500-60 MCG/ML IV SOLN
INTRAVENOUS | Status: DC
  Administered 2015-08-15: 18:00:00 via INTRAVENOUS
  Filled 2015-08-15: qty 1440

## 2015-08-15 MED ORDER — HYDROMORPHONE HCL 1 MG/ML IJ SOLN
INTRAMUSCULAR | Status: AC
  Filled 2015-08-15: qty 1

## 2015-08-15 MED ORDER — PROPOFOL 10 MG/ML IV BOLUS
INTRAVENOUS | Status: AC
  Filled 2015-08-15: qty 20

## 2015-08-15 MED ORDER — GLYCOPYRROLATE 0.2 MG/ML IJ SOLN
INTRAMUSCULAR | Status: DC | PRN
  Administered 2015-08-15: .6 mg via INTRAVENOUS

## 2015-08-15 MED ORDER — EVICEL 5 ML EX KIT
PACK | CUTANEOUS | Status: AC
  Filled 2015-08-15: qty 1

## 2015-08-15 MED ORDER — LIDOCAINE HCL (CARDIAC) 20 MG/ML IV SOLN
INTRAVENOUS | Status: DC | PRN
  Administered 2015-08-15: 40 mg via INTRAVENOUS

## 2015-08-15 SURGICAL SUPPLY — 64 items
APPLICATOR FLOSEAL ENDOSCOPIC (HEMOSTASIS) IMPLANT
APPLIER CLIP 5 13 M/L LIGAMAX5 (MISCELLANEOUS)
APPLIER CLIP ROT 10 11.4 M/L (STAPLE)
APR CLP MED LRG 11.4X10 (STAPLE)
CANISTER SUCTION 2500CC (MISCELLANEOUS) ×2 IMPLANT
CANNULA ENDOPATH XCEL 11M (ENDOMECHANICALS) IMPLANT
CLAMP ENDO BABCK 10MM (STAPLE) IMPLANT
CLIP APPLIE 5 13 M/L LIGAMAX5 (MISCELLANEOUS) IMPLANT
CLIP APPLIE ROT 10 11.4 M/L (STAPLE) IMPLANT
COVER SURGICAL LIGHT HANDLE (MISCELLANEOUS) ×2 IMPLANT
DECANTER SPIKE VIAL GLASS SM (MISCELLANEOUS) IMPLANT
DEVICE SUTURE ENDOST 10MM (ENDOMECHANICALS) IMPLANT
DEVICE TROCAR PUNCTURE CLOSURE (ENDOMECHANICALS) ×2 IMPLANT
DISSECTOR BLUNT TIP ENDO 5MM (MISCELLANEOUS) IMPLANT
DRAIN PENROSE 1/2X36 STERILE (WOUND CARE) IMPLANT
DRAPE LAPAROSCOPIC ABDOMINAL (DRAPES) ×2 IMPLANT
ELECT REM PT RETURN 9FT ADLT (ELECTROSURGICAL) ×2
ELECTRODE REM PT RTRN 9FT ADLT (ELECTROSURGICAL) ×1 IMPLANT
ENDOLOOP SUT PDS II  0 18 (SUTURE) ×3
ENDOLOOP SUT PDS II 0 18 (SUTURE) ×3 IMPLANT
FILTER SMOKE EVAC LAPAROSHD (FILTER) IMPLANT
FLOSEAL 10ML (HEMOSTASIS) IMPLANT
GLOVE BIO SURGEON STRL SZ8 (GLOVE) IMPLANT
GLOVE BIOGEL PI IND STRL 7.0 (GLOVE) ×2 IMPLANT
GLOVE BIOGEL PI IND STRL 8 (GLOVE) ×1 IMPLANT
GLOVE BIOGEL PI INDICATOR 7.0 (GLOVE) ×2
GLOVE BIOGEL PI INDICATOR 8 (GLOVE) ×1
GLOVE EUDERMIC 7 POWDERFREE (GLOVE) ×2 IMPLANT
GLOVE SURG SS PI 6.5 STRL IVOR (GLOVE) ×4 IMPLANT
GOWN STRL REUS W/ TWL LRG LVL3 (GOWN DISPOSABLE) ×2 IMPLANT
GOWN STRL REUS W/TWL 2XL LVL3 (GOWN DISPOSABLE) IMPLANT
GOWN STRL REUS W/TWL LRG LVL3 (GOWN DISPOSABLE) ×2
KIT BASIN OR (CUSTOM PROCEDURE TRAY) ×2 IMPLANT
KIT ROOM TURNOVER OR (KITS) ×2 IMPLANT
MESH HERNIA 7X10 (Mesh General) ×2 IMPLANT
NEEDLE INSUFFLATION 14GA 120MM (NEEDLE) ×2 IMPLANT
NS IRRIG 1000ML POUR BTL (IV SOLUTION) ×4 IMPLANT
PAD ARMBOARD 7.5X6 YLW CONV (MISCELLANEOUS) ×4 IMPLANT
PENCIL BUTTON HOLSTER BLD 10FT (ELECTRODE) IMPLANT
RELOAD ENDO STITCH (ENDOMECHANICALS) IMPLANT
SCALPEL HARMONIC ACE (MISCELLANEOUS) ×2 IMPLANT
SCISSORS LAP 5X35 DISP (ENDOMECHANICALS) ×2 IMPLANT
SET IRRIG TUBING LAPAROSCOPIC (IRRIGATION / IRRIGATOR) ×2 IMPLANT
SILICONE PENROSE DRAIN ×2 IMPLANT
STAPLER HERNIA 12 8.5 360D (INSTRUMENTS) ×2 IMPLANT
SUT ETHIBOND X763 2 0 SH 1 (SUTURE) ×2 IMPLANT
SUT MNCRL AB 4-0 PS2 18 (SUTURE) ×2 IMPLANT
SUT SILK 2 0 SH (SUTURE) ×10 IMPLANT
SUT VIC AB 4-0 SH 18 (SUTURE) IMPLANT
SUT VICRYL 0 TIES 12 18 (SUTURE) IMPLANT
TIP INNERVISION DETACH 40FR (MISCELLANEOUS) IMPLANT
TIP INNERVISION DETACH 50FR (MISCELLANEOUS) IMPLANT
TIP INNERVISION DETACH 56FR (MISCELLANEOUS) IMPLANT
TIPS INNERVISION DETACH 40FR (MISCELLANEOUS)
TOWEL OR 17X24 6PK STRL BLUE (TOWEL DISPOSABLE) IMPLANT
TOWEL OR 17X26 10 PK STRL BLUE (TOWEL DISPOSABLE) ×2 IMPLANT
TRAY FOLEY CATH 16FRSI W/METER (SET/KITS/TRAYS/PACK) IMPLANT
TRAY LAPAROSCOPIC MC (CUSTOM PROCEDURE TRAY) ×2 IMPLANT
TROCAR XCEL 12X100 BLDLESS (ENDOMECHANICALS) ×2 IMPLANT
TROCAR XCEL BLADELESS 5X75MML (TROCAR) ×2 IMPLANT
TROCAR XCEL BLUNT TIP 100MML (ENDOMECHANICALS) IMPLANT
TROCAR XCEL NON-BLD 11X100MML (ENDOMECHANICALS) IMPLANT
TUBING INSUFFLATION 10FT LAP (TUBING) ×2 IMPLANT
WATER STERILE IRR 1000ML POUR (IV SOLUTION) IMPLANT

## 2015-08-15 NOTE — Anesthesia Preprocedure Evaluation (Signed)
Anesthesia Evaluation  Patient identified by MRN, date of birth, ID band Patient awake    Reviewed: Allergy & Precautions, NPO status , Patient's Chart, lab work & pertinent test results  Airway Mallampati: III  TM Distance: >3 FB Neck ROM: Full    Dental no notable dental hx. (+) Teeth Intact, Caps   Pulmonary neg pulmonary ROS,    Pulmonary exam normal breath sounds clear to auscultation       Cardiovascular negative cardio ROS Normal cardiovascular exam+ dysrhythmias Atrial Fibrillation  Rhythm:Regular Rate:Normal     Neuro/Psych negative neurological ROS     GI/Hepatic hiatal hernia, PUD, GERD  Medicated and Controlled,Gastric outlet obstruction   Endo/Other  Hypercholesterolemia  Renal/GU negative Renal ROS     Musculoskeletal  (+) Arthritis ,   Abdominal   Peds  Hematology  (+) anemia ,   Anesthesia Other Findings   Reproductive/Obstetrics                             Anesthesia Physical Anesthesia Plan  ASA: II  Anesthesia Plan: General   Post-op Pain Management:    Induction: Intravenous and Cricoid pressure planned  Airway Management Planned: Oral ETT  Additional Equipment:   Intra-op Plan:   Post-operative Plan: Extubation in OR  Informed Consent: I have reviewed the patients History and Physical, chart, labs and discussed the procedure including the risks, benefits and alternatives for the proposed anesthesia with the patient or authorized representative who has indicated his/her understanding and acceptance.   Dental advisory given  Plan Discussed with: CRNA, Anesthesiologist and Surgeon  Anesthesia Plan Comments:         Anesthesia Quick Evaluation

## 2015-08-15 NOTE — Discharge Instructions (Signed)
EATING AFTER YOUR ESOPHAGEAL SURGERY °(Stomach Fundoplication, Hiatal Hernia repair, Achalasia surgery, etc) ° °After your esophageal surgery, expect some sticking with swallowing over the next 1-2 months.   ° °If food sticks when you eat, it is called "dysphagia".  This is due to swelling around your esophagus at the wrap & hiatal diaphragm repair.  It will gradually ease off over the next few months.  To help you through this temporary phase, we start you out on a pureed (blenderized) diet. ° °Your first meal in the hospital was thin liquids.  You should have been given a pureed diet by the time you left the hospital.  We ask patients to stay on a pureed diet for the first 2-3 weeks to avoid anything getting "stuck" near your recent surgery.  Don't be alarmed if your ability to swallow doesn't progress according to this plan.  Everyone is different and some diets can advance more or less quickly.   ° ° °Some BASIC RULES to follow are: °· Maintain an upright position whenever eating or drinking. °· Take small bites - just a teaspoon size bite at a time. °· Eat slowly.  It may also help to eat only one food at a time. °· Consider nibbling through smaller, more frequent meals & avoid the urge to eat BIG meals °· Do not push through feelings of fullness, nausea, or bloatedness °· Do not mix solid foods and liquids in the same mouthful °· Try not to "wash foods down" with large gulps of liquids. °· Avoid carbonated (bubbly/fizzy) drinks.   °· Avoid foods that make you feel gassy or bloated.  Start with bland foods first.  Wait on trying greasy, fried, or spicy meals until you are tolerating more bland solids well. °· Understand that it will be hard to burp and belch at first.  This gradually improves with time.  Expect to be more gassy/flatulent/bloated initially.  Walking will help your body manage it better. °· Consider using medications for bloating that contain simethicone such as  Maalox or Gas-X  °· Eat in a  relaxed atmosphere & minimize distractions. °· Avoid talking while eating.   °· Do not use straws. °· Following each meal, sit in an upright position (90 degree angle) for 60 to 90 minutes.  Going for a short walk can help as well °· If food does stick, don't panic.  Try to relax and let the food pass on its own.  Sipping WARM LIQUID such as strong hot black tea can also help slide it down. ° ° °Be gradual in changes & use common sense: ° °-If you easily tolerating a certain "level" of foods, advance to the next level gradually °-If you are having trouble swallowing a particular food, then avoid it.   °-If food is sticking when you advance your diet, go back to thinner previous diet (the lower LEVEL) for 1-2 days. ° °LEVEL 1 = PUREED DIET ° °Do for the first 2 WEEKS AFTER SURGERY ° °-Foods in this group are pureed or blenderized to a smooth, mashed potato-like consistency.  °-If necessary, the pureed foods can keep their shape with the addition of a thickening agent.   °-Meat should be pureed to a smooth, pasty consistency.  Hot broth or gravy may be added to the pureed meat, approximately 1 oz. of liquid per 3 oz. serving of meat. °-CAUTION:  If any foods do not puree into a smooth consistency, swallowing will be more difficult.  (For example, nuts   or seeds sometimes do not blend well.) ° °Hot Foods Cold Foods  °Pureed scrambled eggs and cheese Pureed cottage cheese  °Baby cereals Thickened juices and nectars  °Thinned cooked cereals (no lumps) Thickened milk or eggnog  °Pureed French toast or pancakes Ensure  °Mashed potatoes Ice cream  °Pureed parsley, au gratin, scalloped potatoes, candied sweet potatoes Fruit or Italian ice, sherbet  °Pureed buttered or alfredo noodles Plain yogurt  °Pureed vegetables (no corn or peas) Instant breakfast  °Pureed soups and creamed soups Smooth pudding, mousse, custard  °Pureed scalloped apples Whipped gelatin  °Gravies Sugar, syrup, honey, jelly  °Sauces, cheese, tomato,  barbecue, white, creamed Cream  °Any baby food Creamer  °Alcohol in moderation (not beer or champagne) Margarine  °Coffee or tea Mayonnaise  ° Ketchup, mustard  ° Apple sauce  ° °SAMPLE MENU:  PUREED DIET °Breakfast Lunch Dinner  °· Orange juice, 1/2 cup °· Cream of wheat, 1/2 cup · Pineapple juice, 1/2 cup · Pureed turkey, barley soup, 3/4 cup °· Pureed Hawaiian chicken, 3 oz  °· Scrambled eggs, mashed or blended with cheese, 1/2 cup °· Tea or coffee, 1 cup  °· Whole milk, 1 cup  °· Non-dairy creamer, 2 Tbsp. · Mashed potatoes, 1/2 cup °· Pureed cooled broccoli, 1/2 cup °· Apple sauce, 1/2 cup °· Coffee or tea · Mashed potatoes, 1/2 cup °· Pureed spinach, 1/2 cup °· Frozen yogurt, 1/2 cup °· Tea or coffee  ° ° ° ° °LEVEL 2 = SOFT DIET ° °After your first 2 weeks, you can advance to a soft diet.   °Keep on this diet until everything goes down easily. ° °Hot Foods Cold Foods  °White fish Cottage cheese  °Stuffed fish Junior baby fruit  °Baby food meals Semi thickened juices  °Minced soft cooked, scrambled, poached eggs nectars  °Souffle & omelets Ripe mashed bananas  °Cooked cereals Canned fruit, pineapple sauce, milk  °potatoes Milkshake  °Buttered or Alfredo noodles Custard  °Cooked cooled vegetable Puddings, including tapioca  °Sherbet Yogurt  °Vegetable soup or alphabet soup Fruit ice, Italian ice  °Gravies Whipped gelatin  °Sugar, syrup, honey, jelly Junior baby desserts  °Sauces:  Cheese, creamed, barbecue, tomato, white Cream  °Coffee or tea Margarine  ° °SAMPLE MENU:  LEVEL 2 °Breakfast Lunch Dinner  °· Orange juice, 1/2 cup °· Oatmeal, 1/2 cup °· Scrambled eggs with cheese, 1/2 cup °· Decaffeinated tea, 1 cup °· Whole milk, 1 cup °· Non-dairy creamer, 2 Tbsp · Pineapple juice, 1/2 cup °· Minced beef, 3 oz °· Gravy, 2 Tbsp °· Mashed potatoes, 1/2 cup °· Minced fresh broccoli, 1/2 cup °· Applesauce, 1/2 cup °· Coffee, 1 cup · Turkey, barley soup, 3/4 cup °· Minced Hawaiian chicken, 3 oz °· Mashed potatoes, 1/2  cup °· Cooked spinach, 1/2 cup °· Frozen yogurt, 1/2 cup °· Non-dairy creamer, 2 Tbsp  ° ° ° ° °LEVEL 3 = CHOPPED DIET ° °-After all the foods in level 2 (soft diet) are passing through well you should advance up to more chopped foods.  °-It is still important to cut these foods into small pieces and eat slowly. ° °Hot Foods Cold Foods  °Poultry Cottage cheese  °Chopped Swedish meatballs Yogurt  °Meat salads (ground or flaked meat) Milk  °Flaked fish (tuna) Milkshakes  °Poached or scrambled eggs Soft, cold, dry cereal  °Souffles and omelets Fruit juices or nectars  °Cooked cereals Chopped canned fruit  °Chopped French toast or pancakes Canned fruit cocktail  °Noodles or   pasta (no rice) Pudding, mousse, custard  °Cooked vegetables (no frozen peas, corn, or mixed vegetables) Green salad  °Canned small sweet peas Ice cream  °Creamed soup or vegetable soup Fruit ice, Italian ice  °Pureed vegetable soup or alphabet soup Non-dairy creamer  °Ground scalloped apples Margarine  °Gravies Mayonnaise  °Sauces:  Cheese, creamed, barbecue, tomato, white Ketchup  °Coffee or tea Mustard  ° °SAMPLE MENU:  LEVEL 3 °Breakfast Lunch Dinner  °· Orange juice, 1/2 cup °· Oatmeal, 1/2 cup °· Scrambled eggs with cheese, 1/2 cup °· Decaffeinated tea, 1 cup °· Whole milk, 1 cup °· Non-dairy creamer, 2 Tbsp °· Ketchup, 1 Tbsp °· Margarine, 1 tsp °· Salt, 1/4 tsp °· Sugar, 2 tsp · Pineapple juice, 1/2 cup °· Ground beef, 3 oz °· Gravy, 2 Tbsp °· Mashed potatoes, 1/2 cup °· Cooked spinach, 1/2 cup °· Applesauce, 1/2 cup °· Decaffeinated coffee °· Whole milk °· Non-dairy creamer, 2 Tbsp °· Margarine, 1 tsp °· Salt, 1/4 tsp · Pureed turkey, barley soup, 3/4 cup °· Barbecue chicken, 3 oz °· Mashed potatoes, 1/2 cup °· Ground fresh broccoli, 1/2 cup °· Frozen yogurt, 1/2 cup °· Decaffeinated tea, 1 cup °· Non-dairy creamer, 2 Tbsp °· Margarine, 1 tsp °· Salt, 1/4 tsp °· Sugar, 1 tsp  ° ° °LEVEL 4:  REGULAR FOODS ° °-Foods in this group are soft,  moist, regularly textured foods.   °-This level includes meat and breads, which tend to be the hardest things to swallow.   °-Eat very slowly, chew well and continue to avoid carbonated drinks. °-most people are at this level in 4-6 weeks ° °Hot Foods Cold Foods  °Baked fish or skinned Soft cheeses - cottage cheese  °Souffles and omelets Cream cheese  °Eggs Yogurt  °Stuffed shells Milk  °Spaghetti with meat sauce Milkshakes  °Cooked cereal Cold dry cereals (no nuts, dried fruit, coconut)  °French toast or pancakes Crackers  °Buttered toast Fruit juices or nectars  °Noodles or pasta (no rice) Canned fruit  °Potatoes (all types) Ripe bananas  °Soft, cooked vegetables (no corn, lima, or baked beans) Peeled, ripe, fresh fruit  °Creamed soups or vegetable soup Cakes (no nuts, dried fruit, coconut)  °Canned chicken noodle soup Plain doughnuts  °Gravies Ice cream  °Bacon dressing Pudding, mousse, custard  °Sauces:  Cheese, creamed, barbecue, tomato, white Fruit ice, Italian ice, sherbet  °Decaffeinated tea or coffee Whipped gelatin  °Pork chops Regular gelatin  ° Canned fruited gelatin molds  ° Sugar, syrup, honey, jam, jelly  ° Cream  ° Non-dairy  ° Margarine  ° Oil  ° Mayonnaise  ° Ketchup  ° Mustard  ° °TROUBLESHOOTING IRREGULAR BOWELS  °1) Avoid extremes of bowel movements (no bad constipation/diarrhea)  °2) Miralax 17gm mixed in 8oz. water or juice-daily. May use BID as needed.  °3) Gas-x,Phazyme, etc. as needed for gas & bloating.  °4) Soft,bland diet. No spicy,greasy,fried foods.  °5) Prilosec over-the-counter as needed  °6) May hold gluten/wheat products from diet to see if symptoms improve.  °7) May try probiotics (Align, Activa, etc) to help calm the bowels down  °7) If symptoms become worse call back immediately. ° ° ° °If you have any questions please call our office at [facility]: [phone]. ° °

## 2015-08-15 NOTE — Progress Notes (Signed)
PARENTERAL NUTRITION CONSULT NOTE - FOLLOW UP  Pharmacy Consult for TPN Indication: Paraesophageal hernia   Allergies  Allergen Reactions  . Bactrim [Sulfamethoxazole-Trimethoprim] Other (See Comments)    Canker sores in her mouth.  . Adhesive [Tape] Rash and Other (See Comments)    Burns to the skin  . Latex Rash and Other (See Comments)    Burns to skin    Patient Measurements: Height:  (162.6 cm) Weight: 165 lb (74.844 kg) IBW/kg (Calculated) : 54.7 Adjusted Body Weight: 60 kg  Usual Weight: 75 kg   Vital Signs: Temp: 98.3 F (36.8 C) (01/25 0446) Temp Source: Oral (01/25 0446) BP: 115/65 mmHg (01/25 0446) Pulse Rate: 73 (01/25 0446) Intake/Output from previous day:   Intake/Output from this shift:    Labs:  Recent Labs  08/12/15 0840 08/14/15 0500  WBC 7.4 6.5  HGB 12.3 12.5  HCT 37.6 37.2  PLT 258 252     Recent Labs  08/12/15 0840 08/13/15 1720 08/14/15 0500  NA 141  --  142  K 3.4*  --  3.6  CL 109  --  109  CO2 25  --  25  GLUCOSE 108*  --  94  BUN <5*  --  7  CREATININE 0.64  --  0.66  CALCIUM 9.2  --  9.3  MG  --   --  2.0  PHOS  --   --  4.6  PROT  --   --  6.1*  ALBUMIN  --   --  3.4*  AST  --   --  19  ALT  --   --  13*  ALKPHOS  --   --  67  BILITOT  --   --  1.0  PREALBUMIN  --  28.8 25.1  TRIG  --   --  138   Estimated Creatinine Clearance: 73.1 mL/min (by C-G formula based on Cr of 0.66).    Recent Labs  08/14/15 2100 08/15/15 0040 08/15/15 0443  GLUCAP 100* 130* 101*    Medical History: Past Medical History  Diagnosis Date  . Deviated nasal septum   . Colon polyp 2008    on last colonoscopy with Dr. Loreta Ave in 2008  . Hypercholesteremia   . Osteoarthritis   . Allergic rhinitis   . Vitamin B12 deficiency   . Diverticulosis 2008    on last colonoscopy with Dr. Loreta Ave in 2008  . Atrial fibrillation (HCC) 08/22/2013  . GERD (gastroesophageal reflux disease)   . Gastric ulcer with hemorrhage   . Iron deficiency  anemia     After ulcer in 2013, resolved    Medications:  Prescriptions prior to admission  Medication Sig Dispense Refill Last Dose  . aspirin 81 MG tablet Take 81 mg by mouth 2 (two) times daily.    08/09/2015 at Unknown time  . Cyanocobalamin (B-12) 3000 MCG SUBL Place 3,000 mcg under the tongue daily.   Past Week at Unknown time  . fexofenadine-pseudoephedrine (ALLEGRA-D) 60-120 MG per tablet Take 1 tablet by mouth daily as needed (for allergies). Patient is using this medication for allergies.   Past Week at Unknown time  . fluticasone (FLONASE) 50 MCG/ACT nasal spray Place 2 sprays into the nose daily as needed for allergies.    08/09/2015 at Unknown time  . Omega-3 Fatty Acids (FISH OIL PO) Take 2 capsules by mouth daily.    08/09/2015 at Unknown time  . ferrous sulfate 325 (65 FE) MG tablet Take 1 tablet (  325 mg total) by mouth 2 (two) times daily with a meal. 60 tablet 0 12/10/2012 at Unknown  . omeprazole (PRILOSEC) 20 MG capsule TAKE ONE CAPSULE BY MOUTH ONCE DAILY (Patient not taking: Reported on 08/10/2015) 30 capsule 0     Insulin Requirements in the past 24 hours:  1 unit sensitive SSI   Assessment: 59 YOF with abdominal pain and vomiting found to have a large paraesophageal hiatal hernia and a proximal gastric outlet obstruction. An NG tube was placed which improved symptoms and she underwent EGD on 1/20. Unable to reduce hernia endoscopically. Surgery was consulted and now planning surgical repair that can be done laparoscopically. Pharmacy consulted yesterday to start TPN since patient has has no oral intake in 5 days and surgery date has not been set.   Surgeries/Procedures: 1/20: EGD  GI: H/o gastric ulcer. Large paraesophageal hiatal hernia and a proximal gastric outlet obstruction. Lap hiatal hernia repair and nissen fundo with mesh planned for today Endo: CBGs 100-101 Lytes: K down to 3.3, Mg 2.1, Phos 4.3 Renal: SCr 0.66, CrCl ~ 70-75 mL/min, D51/2NS @ 35  Pulm: 98% RA   Cards: H/o Afib, VSS, TG 138  Hepatobil: LFTs wnl. Prealbumin 25.1  Heme: H/H and Plt wnl  ID: WBC wnl  Best Practices: Lovenox  TPN Access: PICC 1/23>>  TPN start date: 1/24>>   Current Nutrition:  Clinimix E 5/15 @ 40 mL/hr and IVFE @ 10 mL/hr   Nutritional Goals per RD:  Kcal: 1850-2050 Protein: 90-100 grams Fluid: 1.8-2 L/day  Plan:  -Increase Clinimix E 5/15 to 60 mL/hr and IVFE @ 10 mL/hr. This will provide 72 gm of protein and 1502 kCal. Advance to goal as tolerated  -Decrease D5 1/2NS rate to 10 mL/hr starting at 1800 today to accommodate TPN -Give IV KCl 10 meQ x 3 runs today  -D/c IV pepcid order and add Pepcid 40 mg IV to TPN bag  -Add MVI and TE to TPN bag -F/u TPN labs -F/u after surgery today   Vinnie Level, PharmD., BCPS Clinical Pharmacist Pager [phone]

## 2015-08-15 NOTE — Transfer of Care (Signed)
Immediate Anesthesia Transfer of Care Note  Patient: Jenna [name]  Procedure(s) Performed: Procedure(s): LAPAROSCOPIC HIATAL HERNIA REPAIR AND NISSEN FUNDOPLICATION (N/A)  Patient Location: PACU  Anesthesia Type:General  Level of Consciousness: awake, alert , oriented and patient cooperative  Airway & Oxygen Therapy: Patient Spontanous Breathing and Patient connected to nasal cannula oxygen  Post-op Assessment: Report given to RN and Post -op Vital signs reviewed and stable  Post vital signs: Reviewed and stable  Last Vitals:  Filed Vitals:   08/15/15 0446 08/15/15 0837  BP: 115/65 111/70  Pulse: 73 80  Temp: 36.8 C 36.6 C  Resp: 18 18    Complications: No apparent anesthesia complications

## 2015-08-15 NOTE — Progress Notes (Signed)
Report given to erika rn as caregiver 

## 2015-08-15 NOTE — Anesthesia Postprocedure Evaluation (Signed)
Anesthesia Post Note  Patient: Jenna [name]  Procedure(s) Performed: Procedure(s) (LRB): LAPAROSCOPIC HIATAL HERNIA REPAIR AND NISSEN FUNDOPLICATION (N/A)  Patient location during evaluation: PACU Anesthesia Type: General Level of consciousness: awake and alert and oriented Pain management: pain level controlled Vital Signs Assessment: post-procedure vital signs reviewed and stable Respiratory status: spontaneous breathing, nonlabored ventilation, respiratory function stable and patient connected to nasal cannula oxygen Cardiovascular status: blood pressure returned to baseline and stable Postop Assessment: no signs of nausea or vomiting Anesthetic complications: no    Last Vitals:  Filed Vitals:   08/15/15 1500 08/15/15 1520  BP:  111/67  Pulse: 75 66  Temp:    Resp: 10 16    Last Pain:  Filed Vitals:   08/15/15 1540  PainSc: 5                  Vaughn Beaumier A.

## 2015-08-15 NOTE — Progress Notes (Signed)
Patient Demographics  Jenna [name], is a 60 y.o. female, DOB - [birth year], WNU:[id number]  Admit date - 08/09/2015   Admitting Physician Alberteen Sam, MD  Outpatient Primary MD for the patient is Mickie Hillier, MD  LOS - 6   Chief Complaint  Patient presents with  . Chest Pain       Admission HPI/Brief narrative: 60 y.o. female with a past medical history significant for PAF on aspirin and hx of gastric ulcer from Excedrin with GIB in 2013 who presents with abdominal pain and vomiting, Further evaluation with a CT scan revealed a large paraesophageal hiatal hernia and a proximal gastric outlet obstruction. An NG tube was placed and it improved her symptoms, recent underwent EGD 1/20 by Dr. Elnoria Howard, unable to reduce hernia endoscopically, with recommendation for surgical repair, plan forced laparoscopic surgical repair by Dr. Derrell Lolling this afternoon. Subjective:   Jenna [name] today has, No headache, No chest pain, No abdominal pain, no vomiting.  Assessment & Plan    Principal Problem:   Gastric outflow obstruction Active Problems:   Atrial fibrillation (HCC)   Gastric out let obstruction  Nausea and vomiting secondary to gastric outlet obstruction from Paraesophageal hernia - Symptoms significantly subsided after NG tube insertion, CT abdomen pelvis suggest outlet obstruction from large bore esophageal hernia. - GI consult greatly appreciated, status post endoscopy 1/20, significant for Large paraesophageal hiatal hernia. And Mild Candidal esophagitis, GI was Unable to reduce endoscopically. They recommended surgical repair of hernia, surgery on board,  she'll have laparoscopic repair this afternoon. - Continue with IV fluid as remains nothing by mouth. - Started on TPN.  Candidal esophagitis - We'll treat with IV fluconazole , will need total of 14 days, transition to po when  able to tolerate oral.  Paroxysmal atrial fibrillation - CHADS2Vasc 1, resume aspirin when able to take oral intake.  Hypokalemia - Repleted ,  Code Status: full  Family Communication: None at bedside  Disposition Plan: Pending further workup   We will sign off, please reconsult [place] if any question arise or patient needs to be followed. Procedures  Endoscopy 08/10/2015   Consults   Gen. Surgery Gastroenterology   Medications  Scheduled Meds: . [MAR Hold] enoxaparin (LOVENOX) injection  40 mg Subcutaneous Daily  . [MAR Hold] famotidine (PEPCID) IV  20 mg Intravenous Q12H  . [MAR Hold] fluconazole (DIFLUCAN) IV  200 mg Intravenous Q24H  . [MAR Hold] insulin aspart  0-9 Units Subcutaneous 6 times per day   Continuous Infusions: . dextrose 5 % and 0.45 % NaCl with KCl 20 mEq/L    . Marland KitchenTPN (CLINIMIX-E) Adult 40 mL/hr at 08/14/15 1739   And  . fat emulsion 240 mL (08/14/15 1739)  . lactated ringers 10 mL/hr at 08/15/15 1107   PRN Meds:.[MAR Hold] acetaminophen **OR** [MAR Hold] acetaminophen, [MAR Hold]  HYDROmorphone (DILAUDID) injection, [MAR Hold] ondansetron **OR** [MAR Hold] ondansetron (ZOFRAN) IV, [MAR Hold] sodium chloride, sodium chloride irrigation  DVT Prophylaxis  Lovenox   Lab Results  Component Value Date   PLT 252 08/14/2015    Antibiotics    Anti-infectives    Start     Dose/Rate Route Frequency Ordered Stop   08/15/15 1300  [MAR Hold]  fluconazole (DIFLUCAN) IVPB  200 mg     (MAR Hold since 08/15/15 1048)   200 mg 100 mL/hr over 60 Minutes Intravenous Every 24 hours 08/14/15 1644 08/27/15 1259   08/15/15 0600  ceFAZolin (ANCEF) IVPB 1 g/50 mL premix     1 g 100 mL/hr over 30 Minutes Intravenous To ShortStay Surgical 08/14/15 0837 08/15/15 1205   08/13/15 1300  fluconazole (DIFLUCAN) IVPB 100 mg  Status:  Discontinued     100 mg 50 mL/hr over 60 Minutes Intravenous Every 24 hours 08/13/15 1227 08/14/15 1644          Objective:   Filed Vitals:    08/14/15 1450 08/14/15 2103 08/15/15 0446 08/15/15 0837  BP: 112/72 129/76 115/65 111/70  Pulse: 88 76 73 80  Temp: 98 F (36.7 C) 98.3 F (36.8 C) 98.3 F (36.8 C) 97.8 F (36.6 C)  TempSrc:  Oral Oral Oral  Resp: 18 18 18 18   Height:      Weight:      SpO2: 99% 99% 96% 97%    Wt Readings from Last 3 Encounters:  08/10/15 74.844 kg (165 lb)  07/28/14 74.844 kg (165 lb)  08/22/13 77.111 kg (170 lb)     Intake/Output Summary (Last 24 hours) at 08/15/15 1245 Last data filed at 08/15/15 1230  Gross per 24 hour  Intake      0 ml  Output    120 ml  Net   -120 ml     Physical Exam  Awake Alert, Oriented X 3, No new F.N deficits, Normal affect [place].AT,PERRAL Supple Neck,No JVD, No cervical lymphadenopathy appriciated.  Symmetrical Chest wall movement, Good air movement bilaterally, CTAB RRR,No Gallops,Rubs or new Murmurs, No Parasternal Heave +ve B.Sounds, Abd Soft, No tenderness, No organomegaly appriciated, No rebound - guarding or rigidity. No Cyanosis, Clubbing or edema, No new Rash or bruise    Data Review   Micro Results Recent Results (from the past 240 hour(s))  Surgical pcr screen     Status: None   Collection Time: 08/14/15 10:30 PM  Result Value Ref Range Status   MRSA, PCR NEGATIVE NEGATIVE Final   Staphylococcus aureus NEGATIVE NEGATIVE Final    Comment:        The Xpert SA Assay (FDA approved for NASAL specimens in patients over 61 years of age), is one component of a comprehensive surveillance program.  Test performance has been validated by [facility] for patients greater than or equal to 54 year old. It is not intended to diagnose infection nor to guide or monitor treatment.     Radiology Reports Dg Chest 2 View  08/09/2015  CLINICAL DATA:  Mid to left chest pain. EXAM: CHEST  2 VIEW COMPARISON:  07/28/2014. FINDINGS: Increased size of a large hiatal hernia containing an air-fluid level. Interval small amount of linear density at the  left lung base. Otherwise, clear lungs. Unremarkable bones. IMPRESSION: 1. Increased size of a large hiatal hernia containing an air-fluid level. 2. Interval mild linear atelectasis or scarring at the left lung base. Electronically Signed   By: Beckie Salts M.D.   On: 08/09/2015 16:05   Ct Abdomen Pelvis W Contrast  08/09/2015  CLINICAL DATA:  Acute generalized abdominal pain. EXAM: CT ABDOMEN AND PELVIS WITH CONTRAST TECHNIQUE: Multidetector CT imaging of the abdomen and pelvis was performed using the standard protocol following bolus administration of intravenous contrast. CONTRAST:  OMNIPAQUE IOHEXOL 300 MG/ML  SOLN COMPARISON:  None. FINDINGS: Severe degenerative disc disease is noted at  L1-2. Visualized lung bases are unremarkable. Large paraesophageal hiatal hernia is noted resulting in dilatation and possible obstruction of the more proximal stomach. Small solitary gallstone is noted. No significant abnormality is noted in the liver, spleen or pancreas. Adrenal glands are unremarkable. Parapelvic cysts are noted in left kidney. Otherwise kidneys appear normal. No hydronephrosis or renal obstruction is noted. The appendix appears normal. There is no evidence of large or small bowel obstruction. No abnormal fluid collection is noted. Sigmoid diverticulosis is noted without inflammation. Urinary bladder appears normal. Uterus and ovaries are unremarkable. No significant adenopathy is noted. IMPRESSION: Large paraesophageal hiatal hernia is noted which results in dilatation and probable obstruction of the more proximal stomach. Sigmoid diverticulosis is noted without inflammation. Small solitary gallstone is noted. Electronically Signed   By: Lupita Raider, M.D.   On: 08/09/2015 21:25   US Abdomen Limited  08/09/2015  CLINICAL DATA:  60 year old female with acute right upper quadrant abdominal pain. EXAM: US ABDOMEN LIMITED - RIGHT UPPER QUADRANT COMPARISON:  None. FINDINGS: Gallbladder: A 6 x 9 x 9  mm nonmobile oval hypoechoic structure/polyp along the gallbladder wall is identified. At least 1 area along ring down artifact from the gallbladder wall is noted compatible with adenomyomatosis. There is no evidence of cholelithiasis or acute cholecystitis. Common bile duct: Diameter: 3 mm. Liver: No focal lesion identified. Within normal limits in parenchymal echogenicity. IMPRESSION: 6 x 9 x 9 mm gallbladder polyp. Ultrasound follow-up is recommended in 1 year. This recommendation follows ACR consensus guidelines: White Paper of the ACR Incidental Findings Committee II on Gallbladder and Biliary Findings. J Am Coll Radiol 2013:;10:953-956. Gallbladder adenomyomatosis. No other significant abnormalities. No evidence of cholelithiasis or acute cholecystitis. Electronically Signed   By: Harmon Pier M.D.   On: 08/09/2015 18:18   Dg Kayleen Memos W/water Sol Cm  08/11/2015  CLINICAL DATA:  Hiatal hernia with obstruction. EXAM: UPPER GI SERIES WITH KUB TECHNIQUE: After obtaining a scout radiograph a routine upper GI series was performed using thin Gastrografin. FLUOROSCOPY TIME:  Fluoroscopy Time (in minutes and seconds): 1 minutes and 18 seconds COMPARISON:  CT abdomen and pelvis dated 08/09/2015. FINDINGS: Initial images showed slow contrast passage from the distal esophagus to the proximal stomach. 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 Associated tight narrowings noted just below the gastroesophageal junction and within the mid gastric body region. There were multiple episodes of reflux to the level of the mid esophagus, with subsequent development of tertiary contractions and dysmotility with to and fro flow of contrast within the distal esophagus. Patient experienced no emesis during the exam. Contrast did eventually pass to the pylorus after some delay. I was asked by the physician assistant to  place a nasogastric tube if possible. Due to this configuration of the stomach, nasogastric tube placement was not attempted. IMPRESSION: Marked tortuosity of the stomach, commencing just below the level of the gastroesophageal junction, most suggestive of organoaxial gastric volvulus, with associated tight narrowings noted just below the level of the gastroesophageal junction and within the mid gastric body region. Contrast did eventually move through the stomach after some delay. Multiple episodes of associated reflux identified during the examination to the level of the mid esophagus, with subsequent development of tertiary contractions and esophageal dysmotility with "to and fro" flow of contrast in the distal esophagus. Findings discussed with the surgeon on call, Dr. Violeta Gelinas, at the time  of the exam. Electronically Signed   By: Bary Richard M.D.   On: 08/11/2015 15:03     CBC  Recent Labs Lab 08/09/15 1550 08/10/15 0500 08/12/15 0840 08/14/15 0500  WBC 7.1 10.7* 7.4 6.5  HGB 12.7 12.0 12.3 12.5  HCT 39.3 37.9 37.6 37.2  PLT 268 269 258 252  MCV 93.8 94.5 94.2 92.8  MCH 30.3 29.9 30.8 31.2  MCHC 32.3 31.7 32.7 33.6  RDW 12.2 12.6 12.5 12.4  LYMPHSABS  --   --   --  1.9  MONOABS  --   --   --  0.4  EOSABS  --   --   --  0.2  BASOSABS  --   --   --  0.0    Chemistries   Recent Labs Lab 08/09/15 1550 08/10/15 0500 08/11/15 0317 08/12/15 0840 08/14/15 0500 08/15/15 0741  NA 141 140 140 141 142 140  K 3.1* 3.3* 3.2* 3.4* 3.6 3.3*  CL 105 104 109 109 109 105  CO2 GLUCOSE 109* 140* 112* 108* 94 121*  BUN 12 7 <5* <5* 7 9  CREATININE 0.61 0.63 0.60 0.64 0.66 0.58  CALCIUM 9.3 9.2 8.9 9.2 9.3 9.4  MG  --   --  2.1  --  2.0 2.1  AST 24 21  --   --  19  --   ALT 13* 12*  --   --  13*  --   ALKPHOS 73 69  --   --  67  --   BILITOT 0.6 1.1  --   --  1.0  --     ------------------------------------------------------------------------------------------------------------------ estimated creatinine clearance is 73.1 mL/min (by C-G formula based on Cr of 0.58). ------------------------------------------------------------------------------------------------------------------ No results for input(s): HGBA1C in the last 72 hours. ------------------------------------------------------------------------------------------------------------------  Recent Labs  08/14/15 0500  TRIG 138   ------------------------------------------------------------------------------------------------------------------ No results for input(s): TSH, T4TOTAL, T3FREE, THYROIDAB in the last 72 hours.  Invalid input(s): FREET3 ------------------------------------------------------------------------------------------------------------------ No results for input(s): VITAMINB12, FOLATE, FERRITIN, TIBC, IRON, RETICCTPCT in the last 72 hours.  Coagulation profile No results for input(s): INR, PROTIME in the last 168 hours.  No results for input(s): DDIMER in the last 72 hours.  Cardiac Enzymes  Recent Labs Lab 08/09/15 1550  TROPONINI <0.03   ------------------------------------------------------------------------------------------------------------------ Invalid input(s): POCBNP     Time Spent in minutes   20 minutes   Metztli Sachdev M.D on 08/15/2015 at 12:45 PM  Between 7am to 7pm - Pager - [phone]  After 7pm go to [email or web address] - password [facility]  [facility]   Office  [phone]

## 2015-08-15 NOTE — OR Nursing (Signed)
Pt c/o "feels like its hard to breath". Pt talking in complete sentences, O2sat 100% 2lpm via n/c, Resp 16. Lungs clear. Anesthesia notified, no new orders.

## 2015-08-15 NOTE — Anesthesia Procedure Notes (Signed)
Procedure Name: Intubation Date/Time: 08/15/2015 11:54 AM Performed by: Quentin Ore Pre-anesthesia Checklist: Patient identified, Emergency Drugs available, Suction available, Patient being monitored and Timeout performed Patient Re-evaluated:Patient Re-evaluated prior to inductionOxygen Delivery Method: Circle system utilized Preoxygenation: Pre-oxygenation with 100% oxygen Intubation Type: Rapid sequence and IV induction Laryngoscope Size: Mac and 3 Grade View: Grade I Tube type: Oral Tube size: 7.0 mm Number of attempts: 1 Airway Equipment and Method: Stylet Placement Confirmation: ETT inserted through vocal cords under direct vision,  positive ETCO2 and breath sounds checked- equal and bilateral Secured at: 21 cm Tube secured with: Tape Dental Injury: Teeth and Oropharynx as per pre-operative assessment

## 2015-08-15 NOTE — Op Note (Addendum)
08/15/2015  2:30 PM  PATIENT:  Jenna [name]  60 y.o. female  PRE-OPERATIVE DIAGNOSIS:  HIATAL HERNIA  POST-OPERATIVE DIAGNOSIS:  HIATAL HERNIA  PROCEDURE:  Procedure(s): LAPAROSCOPIC HIATAL HERNIA REPAIR WITH MESH AND NISSEN FUNDOPLICATION (N/A)  SURGEON:  Surgeon(s) and Role:    * Axel Filler, MD - Primary    * Abigail Miyamoto, MD  ANESTHESIA:   local and general  EBL:  Total I/O In: 1250 [I.V.:1000; IV Piggyback:250] Out: 175 [Urine:160; Blood:15]  BLOOD ADMINISTERED:none  DRAINS: none   LOCAL MEDICATIONS USED:  BUPIVICAINE   SPECIMEN:  No Specimen  DISPOSITION OF SPECIMEN:  N/A  COUNTS:  YES  TOURNIQUET:  * No tourniquets in log *  DICTATION: .Dragon Dictation The patient was taken back to the operating room and placed in the lithotomy position with bilateral SCDs in place. She was prepped and draped in the usual sterile fashion.  A foley catheter was placed.  A timeout was called and off all facts and antibiotics were confirmed.  A Veress needle technique was used to insufflate the abdomen to 14 mm of mercury. This was done the midclavicular line just lateral to the umbilicus. A 5 mm trocar and camera were then placed intra-abdominally. Injury to any intra-abdominal organs. A 12 mm trocar was then placed in the epigastrium and the midline under direct visualization.  A 5 mm trocar was then placed in the left subcostal margin, left lower quadrant, and umbilicus under direct visualization. At this time a Nathanson liver retractor was then placed to retract the liver. At this time it could be seen that there was a large amount of omentumand stomach within the left chest. This was grasped and brought down to the abdomen.   At this time we began to dissect the hernia sac anteriorly. We dissected this circumferentially around the esophagus. And up into the anterior mediastinum. We proceeded to dissect the left portion of the esophagus and hiatal hernia sac. This  brought [place] to the left crus. At this point we retracted the esophagus to the left and dissected the right crus, until the left crus could be seen in the most posterior portion.  a non-latex Penrose drain was then placed around the esophagus to help with retraction. This was secured to itself with a Endoloop.   At this time we continued to dissect the surrounding adventitial tissue around the esophagus cephalad in the mediastinum. This allowed the stomach to lay within the abdomen with undue tension.  We then began to dissect away the short gastrics on the greater curvature of the stomach. This allowed [place] to release some tension of the stomach to allow [place] to dissect the left crus more appropriately.  At this time we were able to visualize the crus had a large defect. 2-0 Ethibond stitches were then used in a figure-of-eight fashion 2. This allowed the crus to be reapproximated  without strangulation or narrowing of the esophagus. At this time a piece of Gore Bio-A mesh was cut to shape and placed into the abdomen. This was in placed over the suture repair posterior to the esophagus over the hiatal repair and stapled using the Covedien Universal hernia stapler, with a 4.8 mm load. This allowed the mesh to lay flat against the hiatal repair.  At this time the esophagus was retracted inferiorly. We proceeded to pass the greater curvature of the stomach posterior to the esophagus. A shoeshine technique was performed. At this time the Nissen fundoplication was sutured using 2-0  silk's and interrupted standard fashion approximately 1 cm apart approximately 2-3 cm in length. The middle stitch incorporated a thin layer of the esophagus into the wrap. The wrap laid approximately the 10:00 position.2-0 silk's were used as collar stitches to secure the wrap to the hiatus. These were done in interrupted fashion.     At this time the area checked for hemostasis which was excellent.  the 12 mm trocar site was  reapproximated using an Endo Close and 0 Vicryl interrupted standard fashion.The pneumoperitoneum was evacuated all trochars were removed. All trocar sites were then reapproximated using a 4-0 Monocryl in a subcuticular fashion. the skin was dressed with a LiquiBand.    Delay start of Pharmacological VTE agent (>24hrs) due to surgical blood loss or risk of bleeding: yes

## 2015-08-16 ENCOUNTER — Inpatient Hospital Stay (HOSPITAL_COMMUNITY): Payer: BLUE CROSS/BLUE SHIELD

## 2015-08-16 ENCOUNTER — Encounter (HOSPITAL_COMMUNITY): Payer: Self-pay | Admitting: General Practice

## 2015-08-16 LAB — COMPREHENSIVE METABOLIC PANEL
ALK PHOS: 50 U/L (ref 38–126)
ALK PHOS: 52 U/L (ref 38–126)
ALT: 23 U/L (ref 14–54)
ALT: 23 U/L (ref 14–54)
ANION GAP: 6 (ref 5–15)
ANION GAP: 6 (ref 5–15)
AST: 33 U/L (ref 15–41)
AST: 31 U/L (ref 15–41)
Albumin: 2.8 g/dL — ABNORMAL LOW (ref 3.5–5.0)
Albumin: 2.7 g/dL — ABNORMAL LOW (ref 3.5–5.0)
BUN: 9 mg/dL (ref 6–20)
BUN: 11 mg/dL (ref 6–20)
CALCIUM: 8.9 mg/dL (ref 8.9–10.3)
CALCIUM: 8.2 mg/dL — AB (ref 8.9–10.3)
CHLORIDE: 98 mmol/L — AB (ref 101–111)
CO2: 26 mmol/L (ref 22–32)
CO2: 24 mmol/L (ref 22–32)
CREATININE: 0.61 mg/dL (ref 0.44–1.00)
Chloride: 105 mmol/L (ref 101–111)
Creatinine, Ser: 0.69 mg/dL (ref 0.44–1.00)
Glucose, Bld: 880 mg/dL (ref 65–99)
Glucose, Bld: 407 mg/dL — ABNORMAL HIGH (ref 65–99)
Potassium: 6 mmol/L — ABNORMAL HIGH (ref 3.5–5.1)
Potassium: 5.2 mmol/L — ABNORMAL HIGH (ref 3.5–5.1)
SODIUM: 130 mmol/L — AB (ref 135–145)
SODIUM: 135 mmol/L (ref 135–145)
TOTAL PROTEIN: 5 g/dL — AB (ref 6.5–8.1)
Total Bilirubin: 0.4 mg/dL (ref 0.3–1.2)
Total Bilirubin: 0.7 mg/dL (ref 0.3–1.2)
Total Protein: 5 g/dL — ABNORMAL LOW (ref 6.5–8.1)

## 2015-08-16 LAB — MAGNESIUM: MAGNESIUM: 2.6 mg/dL — AB (ref 1.7–2.4)

## 2015-08-16 LAB — GLUCOSE, CAPILLARY
GLUCOSE-CAPILLARY: 109 mg/dL — AB (ref 65–99)
GLUCOSE-CAPILLARY: 125 mg/dL — AB (ref 65–99)
GLUCOSE-CAPILLARY: 131 mg/dL — AB (ref 65–99)
Glucose-Capillary: 129 mg/dL — ABNORMAL HIGH (ref 65–99)
Glucose-Capillary: 121 mg/dL — ABNORMAL HIGH (ref 65–99)

## 2015-08-16 LAB — PHOSPHORUS: PHOSPHORUS: 6.7 mg/dL — AB (ref 2.5–4.6)

## 2015-08-16 MED ORDER — IOHEXOL 300 MG/ML  SOLN
150.0000 mL | Freq: Once | INTRAMUSCULAR | Status: AC | PRN
  Administered 2015-08-16: 25 mL via ORAL

## 2015-08-16 MED ORDER — BOOST / RESOURCE BREEZE PO LIQD
1.0000 | Freq: Three times a day (TID) | ORAL | Status: DC
  Administered 2015-08-16 – 2015-08-17 (×3): 1 via ORAL

## 2015-08-16 MED ORDER — HYDROCODONE-ACETAMINOPHEN 7.5-325 MG/15ML PO SOLN: 10.0000 mL | ORAL | Status: DC | PRN

## 2015-08-16 MED ORDER — FAMOTIDINE 20 MG PO TABS
20.0000 mg | ORAL_TABLET | Freq: Two times a day (BID) | ORAL | Status: DC
  Administered 2015-08-16 – 2015-08-17 (×2): 20 mg via ORAL
  Filled 2015-08-16 (×2): qty 1

## 2015-08-16 NOTE — Progress Notes (Addendum)
Nutrition Follow-up  DOCUMENTATION CODES:   Not applicable  INTERVENTION:  Provide Boost Breeze po TID, each supplement provides 250 kcal and 9 grams of protein.  TPN per Pharmacy.  RD to continue to monitor.   Diet education given.   NUTRITION DIAGNOSIS:   Inadequate oral intake related to inability to eat as evidenced by NPO status; advanced to clear liquids; ongoing  GOAL:   Patient will meet greater than or equal to 90% of their needs; met  MONITOR:   Weight trends, Labs, Skin, I & O's  REASON FOR ASSESSMENT:   Consult New TPN/TNA  ASSESSMENT:   60 y.o. female with a past medical history significant for pAF on aspirin and hx of gastric ulcer from Excedrin with GIB in 2013 who presents with abdominal pain and vomiting, Further evaluation with a CT scan revealed a large paraesophageal hiatal hernia and a proximal gastric outlet obstruction. An NG tube was placed and it improved her symptoms, recent underwent EGD 1/20 by Dr. Benson Norway, unable to reduce hernia endoscopically, with recommendation for surgical repair  PROCEDURE: (1/25): LAPAROSCOPIC HIATAL HERNIA REPAIR WITH MESH AND NISSEN FUNDOPLICATION  Pt reports minimal abdominal pain. Clinimix E 5/15 is infusing at 60 mL/hr with IVFE @ 10 mL/hr, providing 1502 kcals, and 72 grams of protein. Per MD note, will stop TPN when current bag finishes. Diet has just been advanced to a clear liquid diet. RD to order [facility]. RD was additionally consulted for a diet education. Education given.   Pt with no observed significant fat or muscle mass loss.   Potassium elevated at 5.2.  Diet Order:  Diet clear liquid Room service appropriate?: Yes; Fluid consistency:: Thin  Skin:  Reviewed, no issues  Last BM:  1/19  Height:   Ht Readings from Last 1 Encounters:  08/10/15 '5\' 4"'$  (1.626 m)    Weight:   Wt Readings from Last 1 Encounters:  08/10/15 165 lb (74.844 kg)    Ideal Body Weight:  54.5 kg  BMI:  Body mass  index is 28.31 kg/(m^2).  Estimated Nutritional Needs:   Kcal:  1850-2050  Protein:  90-100 grams  Fluid:  1.8-2 L/day  EDUCATION NEEDS:   No education needs identified at this time  Corrin Parker, MS, RD, LDN Pager # [phone] After hours/ weekend pager # [phone]

## 2015-08-16 NOTE — Progress Notes (Signed)
1 Day Post-Op  Subjective: Doing well this morning Minimal pain and no nausea  Objective: Vital signs in last 24 hours: Temp:  [97.2 F (36.2 C)-98.6 F (37 C)] 98.5 F (36.9 C) (01/26 0506) Pulse Rate:  [63-84] 76 (01/26 0506) Resp:  [10-18] 16 (01/26 0506) BP: (107-127)/(66-75) 127/67 mmHg (01/26 0506) SpO2:  [97 %-100 %] 97 % (01/26 0506) Last BM Date: 08/15/15  Intake/Output from previous day: 01/25 0701 - 01/26 0700 In: 2204.5 [I.V.:1954.5; IV Piggyback:250] Out: 175 [Urine:160; Blood:15] Intake/Output this shift:    Lungs clear Abdomen soft, mildly tender, incisions clean  Lab Results:   Recent Labs  08/14/15 0500  WBC 6.5  HGB 12.5  HCT 37.2  PLT 252   BMET  Recent Labs  08/15/15 0741 08/16/15 0450  NA 140 130*  K 3.3* 6.0*  CL 105 98*  CO2 22 26  GLUCOSE 121* 880*  BUN 9 9  CREATININE 0.58 0.69  CALCIUM 9.4 8.9   PT/INR No results for input(s): LABPROT, INR in the last 72 hours. ABG No results for input(s): PHART, HCO3 in the last 72 hours.  Invalid input(s): PCO2, PO2  Studies/Results: No results found.  Anti-infectives: Anti-infectives    Start     Dose/Rate Route Frequency Ordered Stop   08/15/15 1300  fluconazole (DIFLUCAN) IVPB 200 mg     200 mg 100 mL/hr over 60 Minutes Intravenous Every 24 hours 08/14/15 1644 08/27/15 1259   08/15/15 0600  ceFAZolin (ANCEF) IVPB 1 g/50 mL premix     1 g 100 mL/hr over 30 Minutes Intravenous To ShortStay Surgical 08/14/15 0837 08/15/15 1205   08/13/15 1300  fluconazole (DIFLUCAN) IVPB 100 mg  Status:  Discontinued     100 mg 50 mL/hr over 60 Minutes Intravenous Every 24 hours 08/13/15 1227 08/14/15 1644      Assessment/Plan: s/p Procedure(s): LAPAROSCOPIC HIATAL HERNIA REPAIR AND NISSEN FUNDOPLICATION (N/A)  For esophogram today Will stop TNA when current bag finishes if study is ok  LOS: 7 days    Jenna [name] A 08/16/2015

## 2015-08-16 NOTE — Progress Notes (Signed)
Critical value CMP blood glucose 880 taken at 0450. Retook BS with a result of 121. Ordered a redo CMP.

## 2015-08-16 NOTE — Plan of Care (Signed)
Problem: Food- and Nutrition-Related Knowledge Deficit (NB-1.1) Goal: Nutrition education Formal process to instruct or train a patient/client in a skill or to impart knowledge to help patients/clients voluntarily manage or modify food choices and eating behavior to maintain or improve health. Outcome: Completed/Met Date Met:  08/16/15 RD consulted for diet education.   Patient was given handout "Full Liquid Diet" and was instructed per MD to follow this diet for 2 weeks post op. A list of foods recommended and not recommended were given and discussed. Pt was educated on nutritional supplements and was encouraged to consume them to aid in caloric and protein needs. Teach back method used.  Expect good compliance.   Corrin Parker, MS, RD, LDN Pager # [phone] After hours/ weekend pager # [phone]

## 2015-08-17 LAB — CREATININE, SERUM
CREATININE: 0.54 mg/dL (ref 0.44–1.00)
GFR calc Af Amer: >60 mL/min (ref >60)

## 2015-08-17 MED ORDER — HYDROCODONE-ACETAMINOPHEN 7.5-325 MG/15ML PO SOLN: 10.0000 mL | ORAL | Status: DC | PRN

## 2015-08-17 NOTE — Progress Notes (Signed)
Patient ID: Jenna [name], female   DOB: [birth year], 60 y.o.   MRN: [id number]  Doing well Tolerating liquids Abdomen soft  Plan: Discharge home

## 2015-08-17 NOTE — Discharge Summary (Signed)
Physician Discharge Summary  Patient ID: Jenna [name] MRN: [id number] DOB/AGE: May 27, 1956 60 y.o.  Admit date: 08/09/2015 Discharge date: 08/17/2015  Admission Diagnoses:  Discharge Diagnoses:  Principal Problem:   Gastric outflow obstruction Active Problems:   Atrial fibrillation (HCC)   Gastric out let obstruction paraesophageal hernia  Discharged Condition: good  Hospital Course: admitted with paraesophageal hernia causing gastric outlet obstruction.  Admitted by Hospitalists.  GI asked to see.  Dr. Elnoria Howard performed upper endo.  Saw no ischemia but unable to reduce hernia.  Surgery asked to see.  Transferred to [facility] and underwent repair laparoscopically.  Post op esophogram negative for leak.  She tolerated resumption on liquids and pain was well controled so discharged home POD#2  Consults: GI and general surgery  Significant Diagnostic Studies: endoscopy: gastroscopy: hernia  Treatments: surgery: lap Nissen with repair of paraesophageal herna  Discharge Exam: Blood pressure 113/64, pulse 110, temperature 97.8 F (36.6 C), temperature source Oral, resp. rate 18, height  (1.626 m), weight 74.844 kg (165 lb), SpO2 93 %. General appearance: alert, cooperative and no distress Resp: clear to auscultation bilaterally Cardio: regular rate and rhythm, S1, S2 normal, no murmur, click, rub or gallop Incision/Wound:abdomen soft, incisions clean  Disposition: 01-Home or Self Care     Medication List    TAKE these medications        aspirin 81 MG tablet  Take 81 mg by mouth 2 (two) times daily.     B-12 3000 MCG Subl  Place 3,000 mcg under the tongue daily.     ferrous sulfate 325 (65 FE) MG tablet  Take 1 tablet (325 mg total) by mouth 2 (two) times daily with a meal.     fexofenadine-pseudoephedrine 60-120 MG 12 hr tablet  Commonly known as:  ALLEGRA-D  Take 1 tablet by mouth daily as needed (for allergies). Patient is using this medication for allergies.     FISH  OIL PO  Take 2 capsules by mouth daily.     fluticasone 50 MCG/ACT nasal spray  Commonly known as:  FLONASE  Place 2 sprays into the nose daily as needed for allergies.     HYDROcodone-acetaminophen 7.5-325 mg/15 ml solution  Commonly known as:  HYCET  Take 10 mLs by mouth every 4 (four) hours as needed for moderate pain.     omeprazole 20 MG capsule  Commonly known as:  PRILOSEC  TAKE ONE CAPSULE BY MOUTH ONCE DAILY           Follow-up Information    Follow up with Lajean Saver, MD. Schedule an appointment as soon as possible for a visit in 2 weeks.   Specialty:  General Surgery   Why:  For wound re-check   Contact information:   [address] [place] [address] [phone]       Signed: Shelly Rubenstein 08/17/2015, 8:43 AM

## 2015-10-12 ENCOUNTER — Other Ambulatory Visit: Payer: Self-pay

## 2015-10-12 DIAGNOSIS — Z1231 Encounter for screening mammogram for malignant neoplasm of breast: Secondary | ICD-10-CM

## 2015-11-01 ENCOUNTER — Ambulatory Visit: Payer: BLUE CROSS/BLUE SHIELD

## 2015-11-22 ENCOUNTER — Ambulatory Visit
Admission: RE | Admit: 2015-11-22 | Discharge: 2015-11-22 | Disposition: A | Discharge status: Home / Self Care | Payer: BLUE CROSS/BLUE SHIELD | Source: Ambulatory Visit

## 2015-11-22 DIAGNOSIS — Z1231 Encounter for screening mammogram for malignant neoplasm of breast: Secondary | ICD-10-CM

## 2016-01-11 DIAGNOSIS — Z79899 Other long term (current) drug therapy: Secondary | ICD-10-CM | POA: Diagnosis not present

## 2016-01-11 DIAGNOSIS — E7800 Pure hypercholesterolemia, unspecified: Secondary | ICD-10-CM | POA: Diagnosis not present

## 2016-01-11 DIAGNOSIS — E538 Deficiency of other specified B group vitamins: Secondary | ICD-10-CM | POA: Diagnosis not present

## 2016-01-11 DIAGNOSIS — J309 Allergic rhinitis, unspecified: Secondary | ICD-10-CM | POA: Diagnosis not present

## 2016-01-11 DIAGNOSIS — I480 Paroxysmal atrial fibrillation: Secondary | ICD-10-CM | POA: Diagnosis not present

## 2016-02-10 DIAGNOSIS — H109 Unspecified conjunctivitis: Secondary | ICD-10-CM | POA: Diagnosis not present

## 2016-02-12 DIAGNOSIS — H10811 Pingueculitis, right eye: Secondary | ICD-10-CM | POA: Diagnosis not present

## 2016-02-21 DIAGNOSIS — I87393 Chronic venous hypertension (idiopathic) with other complications of bilateral lower extremity: Secondary | ICD-10-CM | POA: Diagnosis not present

## 2016-03-13 DIAGNOSIS — I87392 Chronic venous hypertension (idiopathic) with other complications of left lower extremity: Secondary | ICD-10-CM | POA: Diagnosis not present

## 2016-03-13 DIAGNOSIS — I87393 Chronic venous hypertension (idiopathic) with other complications of bilateral lower extremity: Secondary | ICD-10-CM | POA: Diagnosis not present

## 2016-05-16 DIAGNOSIS — Z23 Encounter for immunization: Secondary | ICD-10-CM | POA: Diagnosis not present

## 2016-05-30 DIAGNOSIS — J0100 Acute maxillary sinusitis, unspecified: Secondary | ICD-10-CM | POA: Diagnosis not present

## 2016-06-20 DIAGNOSIS — M25562 Pain in left knee: Secondary | ICD-10-CM | POA: Diagnosis not present

## 2016-06-20 DIAGNOSIS — M25561 Pain in right knee: Secondary | ICD-10-CM | POA: Diagnosis not present

## 2016-06-20 DIAGNOSIS — G8929 Other chronic pain: Secondary | ICD-10-CM | POA: Diagnosis not present

## 2016-11-21 DIAGNOSIS — M25562 Pain in left knee: Secondary | ICD-10-CM | POA: Diagnosis not present

## 2016-11-21 DIAGNOSIS — M170 Bilateral primary osteoarthritis of knee: Secondary | ICD-10-CM | POA: Diagnosis not present

## 2016-11-21 DIAGNOSIS — G8929 Other chronic pain: Secondary | ICD-10-CM | POA: Diagnosis not present

## 2016-11-21 DIAGNOSIS — M25561 Pain in right knee: Secondary | ICD-10-CM | POA: Diagnosis not present

## 2017-02-06 ENCOUNTER — Other Ambulatory Visit: Payer: Self-pay | Admitting: Family Medicine

## 2017-02-06 DIAGNOSIS — Z1231 Encounter for screening mammogram for malignant neoplasm of breast: Secondary | ICD-10-CM

## 2017-02-25 ENCOUNTER — Ambulatory Visit
Admission: RE | Admit: 2017-02-25 | Discharge: 2017-02-25 | Disposition: A | Discharge status: Home / Self Care | Payer: BLUE CROSS/BLUE SHIELD | Source: Ambulatory Visit | Attending: Family Medicine | Admitting: Family Medicine

## 2017-02-25 DIAGNOSIS — Z1231 Encounter for screening mammogram for malignant neoplasm of breast: Secondary | ICD-10-CM | POA: Diagnosis not present

## 2017-03-26 DIAGNOSIS — Z23 Encounter for immunization: Secondary | ICD-10-CM | POA: Diagnosis not present

## 2017-04-02 DIAGNOSIS — Z86010 Personal history of colonic polyps: Secondary | ICD-10-CM | POA: Diagnosis not present

## 2017-04-02 DIAGNOSIS — K5730 Diverticulosis of large intestine without perforation or abscess without bleeding: Secondary | ICD-10-CM | POA: Diagnosis not present

## 2017-04-02 DIAGNOSIS — Z1211 Encounter for screening for malignant neoplasm of colon: Secondary | ICD-10-CM | POA: Diagnosis not present

## 2017-04-21 DIAGNOSIS — M25562 Pain in left knee: Secondary | ICD-10-CM | POA: Diagnosis not present

## 2017-04-21 DIAGNOSIS — M25561 Pain in right knee: Secondary | ICD-10-CM | POA: Diagnosis not present

## 2017-04-21 DIAGNOSIS — G8929 Other chronic pain: Secondary | ICD-10-CM | POA: Diagnosis not present

## 2017-05-15 DIAGNOSIS — K5730 Diverticulosis of large intestine without perforation or abscess without bleeding: Secondary | ICD-10-CM | POA: Diagnosis not present

## 2017-05-15 DIAGNOSIS — Z1211 Encounter for screening for malignant neoplasm of colon: Secondary | ICD-10-CM | POA: Diagnosis not present

## 2017-05-15 DIAGNOSIS — D122 Benign neoplasm of ascending colon: Secondary | ICD-10-CM | POA: Diagnosis not present

## 2017-05-15 DIAGNOSIS — K635 Polyp of colon: Secondary | ICD-10-CM | POA: Diagnosis not present

## 2017-10-05 DIAGNOSIS — M25562 Pain in left knee: Secondary | ICD-10-CM | POA: Diagnosis not present

## 2017-10-05 DIAGNOSIS — M25561 Pain in right knee: Secondary | ICD-10-CM | POA: Diagnosis not present

## 2017-10-05 DIAGNOSIS — M170 Bilateral primary osteoarthritis of knee: Secondary | ICD-10-CM | POA: Diagnosis not present

## 2017-11-19 DIAGNOSIS — G43009 Migraine without aura, not intractable, without status migrainosus: Secondary | ICD-10-CM | POA: Diagnosis not present

## 2018-02-18 DIAGNOSIS — R8290 Unspecified abnormal findings in urine: Secondary | ICD-10-CM | POA: Diagnosis not present

## 2018-02-18 DIAGNOSIS — Z0000 Encounter for general adult medical examination without abnormal findings: Secondary | ICD-10-CM | POA: Diagnosis not present

## 2018-02-18 DIAGNOSIS — E7800 Pure hypercholesterolemia, unspecified: Secondary | ICD-10-CM | POA: Diagnosis not present

## 2018-02-18 DIAGNOSIS — E538 Deficiency of other specified B group vitamins: Secondary | ICD-10-CM | POA: Diagnosis not present

## 2018-02-25 ENCOUNTER — Other Ambulatory Visit: Payer: Self-pay | Admitting: Family Medicine

## 2018-02-25 DIAGNOSIS — R8290 Unspecified abnormal findings in urine: Secondary | ICD-10-CM

## 2018-02-25 DIAGNOSIS — Z1231 Encounter for screening mammogram for malignant neoplasm of breast: Secondary | ICD-10-CM

## 2018-03-12 ENCOUNTER — Ambulatory Visit
Admission: RE | Admit: 2018-03-12 | Discharge: 2018-03-12 | Disposition: A | Discharge status: Home / Self Care | Payer: BLUE CROSS/BLUE SHIELD | Source: Ambulatory Visit | Attending: Family Medicine | Admitting: Family Medicine

## 2018-03-12 DIAGNOSIS — R8290 Unspecified abnormal findings in urine: Secondary | ICD-10-CM

## 2018-03-12 DIAGNOSIS — R319 Hematuria, unspecified: Secondary | ICD-10-CM | POA: Diagnosis not present

## 2018-03-31 ENCOUNTER — Ambulatory Visit
Admission: RE | Admit: 2018-03-31 | Discharge: 2018-03-31 | Disposition: A | Discharge status: Home / Self Care | Payer: BLUE CROSS/BLUE SHIELD | Source: Ambulatory Visit | Attending: Family Medicine | Admitting: Family Medicine

## 2018-03-31 DIAGNOSIS — Z1231 Encounter for screening mammogram for malignant neoplasm of breast: Secondary | ICD-10-CM | POA: Diagnosis not present

## 2018-04-15 DIAGNOSIS — E7800 Pure hypercholesterolemia, unspecified: Secondary | ICD-10-CM | POA: Diagnosis not present

## 2018-04-15 DIAGNOSIS — Z23 Encounter for immunization: Secondary | ICD-10-CM | POA: Diagnosis not present

## 2018-04-15 DIAGNOSIS — R55 Syncope and collapse: Secondary | ICD-10-CM | POA: Diagnosis not present

## 2018-04-15 DIAGNOSIS — R3129 Other microscopic hematuria: Secondary | ICD-10-CM | POA: Diagnosis not present

## 2018-04-16 DIAGNOSIS — M25561 Pain in right knee: Secondary | ICD-10-CM | POA: Diagnosis not present

## 2018-04-16 DIAGNOSIS — M1712 Unilateral primary osteoarthritis, left knee: Secondary | ICD-10-CM | POA: Diagnosis not present

## 2018-04-16 DIAGNOSIS — M25562 Pain in left knee: Secondary | ICD-10-CM | POA: Diagnosis not present

## 2018-04-16 DIAGNOSIS — M1711 Unilateral primary osteoarthritis, right knee: Secondary | ICD-10-CM | POA: Diagnosis not present

## 2018-05-14 DIAGNOSIS — H9202 Otalgia, left ear: Secondary | ICD-10-CM | POA: Diagnosis not present

## 2018-05-14 DIAGNOSIS — H9201 Otalgia, right ear: Secondary | ICD-10-CM | POA: Diagnosis not present

## 2018-05-17 ENCOUNTER — Encounter: Payer: Self-pay | Admitting: Interventional Cardiology

## 2018-05-19 DIAGNOSIS — M545 Low back pain: Secondary | ICD-10-CM | POA: Diagnosis not present

## 2018-06-08 ENCOUNTER — Ambulatory Visit: Payer: BLUE CROSS/BLUE SHIELD | Admitting: Interventional Cardiology

## 2018-06-08 ENCOUNTER — Encounter

## 2018-06-08 ENCOUNTER — Encounter: Payer: Self-pay | Admitting: Interventional Cardiology

## 2018-06-08 VITALS — BP 144/98 | HR 89 | Ht 64.0 in | Wt 154.8 lb

## 2018-06-08 DIAGNOSIS — I480 Paroxysmal atrial fibrillation: Secondary | ICD-10-CM

## 2018-06-08 DIAGNOSIS — R55 Syncope and collapse: Secondary | ICD-10-CM

## 2018-06-08 DIAGNOSIS — R030 Elevated blood-pressure reading, without diagnosis of hypertension: Secondary | ICD-10-CM | POA: Diagnosis not present

## 2018-06-08 DIAGNOSIS — E785 Hyperlipidemia, unspecified: Secondary | ICD-10-CM

## 2018-06-08 MED ORDER — ROSUVASTATIN CALCIUM 10 MG PO TABS: ORAL_TABLET | ORAL | 3 refills | Status: DC

## 2018-06-08 NOTE — Patient Instructions (Addendum)
Medication Instructions:  Your physician has recommended you make the following change in your medication:   START: rosuvastatin (crestor) 10 mg tablet: Take 1 tablet by mouth once a week   If you need a refill on your cardiac medications before your next appointment, please call your pharmacy.   Lab work: None Ordered  If you have labs (blood work) drawn today and your tests are completely normal, you will receive your results only by: Marland Kitchen MyChart Message (if you have MyChart) OR . A paper copy in the mail If you have any lab test that is abnormal or we need to change your treatment, we will call you to review the results.  Testing/Procedures: None ordered  Follow-Up: . You have been referred to the Lipid Clinic and the Hypertension Clinic. Please schedule an appointment in 1 month.  Any Other Special Instructions Will Be Listed Below (If Applicable).  1. You should be getting 150 minutes of regular exercise a week (30 minutes a day/5 days a week)  2. Stay Hydrated  3. Limit the amount of sodium in your diet   Low-Sodium Eating Plan Sodium, which is an element that makes up salt, helps you maintain a healthy balance of fluids in your body. Too much sodium can increase your blood pressure and cause fluid and waste to be held in your body. Your health care provider or dietitian may recommend following this plan if you have high blood pressure (hypertension), kidney disease, liver disease, or heart failure. Eating less sodium can help lower your blood pressure, reduce swelling, and protect your heart, liver, and kidneys. What are tips for following this plan? General guidelines  Most people on this plan should limit their sodium intake to 1,500-2,000 mg (milligrams) of sodium each day. Reading food labels  The Nutrition Facts label lists the amount of sodium in one serving of the food. If you eat more than one serving, you must multiply the listed amount of sodium by the number of  servings.  Choose foods with less than 140 mg of sodium per serving.  Avoid foods with 300 mg of sodium or more per serving. Shopping  Look for lower-sodium products, often labeled as "low-sodium" or "no salt added."  Always check the sodium content even if foods are labeled as "unsalted" or "no salt added".  Buy fresh foods. ? Avoid canned foods and premade or frozen meals. ? Avoid canned, cured, or processed meats  Buy breads that have less than 80 mg of sodium per slice. Cooking  Eat more home-cooked food and less restaurant, buffet, and fast food.  Avoid adding salt when cooking. Use salt-free seasonings or herbs instead of table salt or sea salt. Check with your health care provider or pharmacist before using salt substitutes.  Cook with plant-based oils, such as canola, sunflower, or olive oil. Meal planning  When eating at a restaurant, ask that your food be prepared with less salt or no salt, if possible.  Avoid foods that contain MSG (monosodium glutamate). MSG is sometimes added to [place] food, bouillon, and some canned foods. What foods are recommended? The items listed may not be a complete list. Talk with your dietitian about what dietary choices are best for you. Grains Low-sodium cereals, including oats, puffed wheat and rice, and shredded wheat. Low-sodium crackers. Unsalted rice. Unsalted pasta. Low-sodium bread. Whole-grain breads and whole-grain pasta. Vegetables Fresh or frozen vegetables. "No salt added" canned vegetables. "No salt added" tomato sauce and paste. Low-sodium or reduced-sodium tomato and  vegetable juice. Fruits Fresh, frozen, or canned fruit. Fruit juice. Meats and other protein foods Fresh or frozen (no salt added) meat, poultry, seafood, and fish. Low-sodium canned tuna and salmon. Unsalted nuts. Dried peas, beans, and lentils without added salt. Unsalted canned beans. Eggs. Unsalted nut butters. Dairy Milk. Soy milk. Cheese that is  naturally low in sodium, such as ricotta cheese, fresh mozzarella, or Swiss cheese Low-sodium or reduced-sodium cheese. Cream cheese. Yogurt. Fats and oils Unsalted butter. Unsalted margarine with no trans fat. Vegetable oils such as canola or olive oils. Seasonings and other foods Fresh and dried herbs and spices. Salt-free seasonings. Low-sodium mustard and ketchup. Sodium-free salad dressing. Sodium-free light mayonnaise. Fresh or refrigerated horseradish. Lemon juice. Vinegar. Homemade, reduced-sodium, or low-sodium soups. Unsalted popcorn and pretzels. Low-salt or salt-free chips. What foods are not recommended? The items listed may not be a complete list. Talk with your dietitian about what dietary choices are best for you. Grains Instant hot cereals. Bread stuffing, pancake, and biscuit mixes. Croutons. Seasoned rice or pasta mixes. Noodle soup cups. Boxed or frozen macaroni and cheese. Regular salted crackers. Self-rising flour. Vegetables Sauerkraut, pickled vegetables, and relishes. Olives. [place] fries. Onion rings. Regular canned vegetables (not low-sodium or reduced-sodium). Regular canned tomato sauce and paste (not low-sodium or reduced-sodium). Regular tomato and vegetable juice (not low-sodium or reduced-sodium). Frozen vegetables in sauces. Meats and other protein foods Meat or fish that is salted, canned, smoked, spiced, or pickled. Bacon, ham, sausage, hotdogs, corned beef, chipped beef, packaged lunch meats, salt pork, jerky, pickled herring, anchovies, regular canned tuna, sardines, salted nuts. Dairy Processed cheese and cheese spreads. Cheese curds. Blue cheese. Feta cheese. String cheese. Regular cottage cheese. Buttermilk. Canned milk. Fats and oils Salted butter. Regular margarine. Ghee. Bacon fat. Seasonings and other foods Onion salt, garlic salt, seasoned salt, table salt, and sea salt. Canned and packaged gravies. Worcestershire sauce. Tartar sauce. Barbecue sauce.  Teriyaki sauce. Soy sauce, including reduced-sodium. Steak sauce. Fish sauce. Oyster sauce. Cocktail sauce. Horseradish that you find on the shelf. Regular ketchup and mustard. Meat flavorings and tenderizers. Bouillon cubes. Hot sauce and Tabasco sauce. Premade or packaged marinades. Premade or packaged taco seasonings. Relishes. Regular salad dressings. Salsa. Potato and tortilla chips. Corn chips and puffs. Salted popcorn and pretzels. Canned or dried soups. Pizza. Frozen entrees and pot pies. Summary  Eating less sodium can help lower your blood pressure, reduce swelling, and protect your heart, liver, and kidneys.  Most people on this plan should limit their sodium intake to 1,500-2,000 mg (milligrams) of sodium each day.  Canned, boxed, and frozen foods are high in sodium. Restaurant foods, fast foods, and pizza are also very high in sodium. You also get sodium by adding salt to food.  Try to cook at home, eat more fresh fruits and vegetables, and eat less fast food, canned, processed, or prepared foods. This information is not intended to replace advice given to you by your health care provider. Make sure you discuss any questions you have with your health care provider. Document Released: 12/27/2001 Document Revised: 06/30/2016 Document Reviewed: 06/30/2016 Elsevier Interactive Patient Education  [employer]2018 Elsevier Inc.

## 2018-06-08 NOTE — Progress Notes (Signed)
Cardiology Office Note   Date:  06/08/2018   ID:  Jenna [name], DOB 05-06-1956, MRN [id number]  PCP:  Catha Gosselin, MD    No chief complaint on file.  PAF  Wt Readings from Last 3 Encounters:  06/08/18 154 lb 12.8 oz (70.2 kg)  08/10/15 165 lb (74.8 kg)  07/28/14 165 lb (74.8 kg)       History of Present Illness: Jenna [name] is a 62 y.o. female  Who has a h/o PAF.  SHe had some chest tightness years ago when she had the AFib.  She had anemia at the time as outlined below.    In the past, she took metoprolol only at night. She stopped taking her statin due to knee pain.  Of note, she had GI bleeding in 2013 when she used large amounts of Excedrin.  Required blood transfusion in August 2013.  We minimized dose of aspirin in the past.  Hyperlipidemia was managed with pravastatin in the past.   Since the last visit many years ago, she has done well.   In September 2019, she had an episode where she was playing dice, sitting, and then got dizzy.  SHe felt sweaty.  She put her head down.  She was nauseated and dizzy.  Some drove her home.  She never passed out.  She went to bed and felt fine the next day.    Other than that episode: Denies : Chest pain. Dizziness. Leg edema. Nitroglycerin use. Orthopnea. Palpitations. Paroxysmal nocturnal dyspnea. Shortness of breath. Syncope.   She has been off aspirin.   She stopped pravastatin after she had joint pains.  A different statin was recently started, but she had pain and this was stopped.   Borderline blood pressure at times.  A month ago, readings at home were ok.      Past Medical History:  Diagnosis Date  . Allergic rhinitis   . Atrial fibrillation (HCC) 08/22/2013  . Colon polyp 2008   on last colonoscopy with Dr. Loreta Ave in 2008  . Deviated nasal septum   . Diverticulosis 2008   on last colonoscopy with Dr. Loreta Ave in 2008  . Gastric ulcer with hemorrhage   . GERD (gastroesophageal reflux disease)   .  Hypercholesteremia   . Iron deficiency anemia    After ulcer in 2013, resolved  . Osteoarthritis   . Vitamin B12 deficiency     Past Surgical History:  Procedure Laterality Date  . CESAREAN SECTION    . ESOPHAGOGASTRODUODENOSCOPY N/A 08/10/2015   Procedure: ESOPHAGOGASTRODUODENOSCOPY (EGD);  Surgeon: Jeani Hawking, MD;  Location: [facility] ENDOSCOPY;  Service: Endoscopy;  Laterality: N/A;  . HIATAL HERNIA REPAIR  08/15/2015  . LAPAROSCOPIC NISSEN FUNDOPLICATION  08/15/2015  . LAPAROSCOPIC NISSEN FUNDOPLICATION N/A 08/15/2015   Procedure: LAPAROSCOPIC HIATAL HERNIA REPAIR AND NISSEN FUNDOPLICATION;  Surgeon: Axel Filler, MD;  Location: [facility] OR;  Service: General;  Laterality: N/A;  . NASAL SINUS SURGERY    . TONSILLECTOMY       Current Outpatient Medications  Medication Sig Dispense Refill  . fexofenadine-pseudoephedrine (ALLEGRA-D) 60-120 MG per tablet Take 1 tablet by mouth daily as needed (for allergies). Patient is using this medication for allergies.    . fluticasone (FLONASE) 50 MCG/ACT nasal spray Place 2 sprays into the nose daily as needed for allergies.      No current facility-administered medications for this visit.     Allergies:   Bactrim [sulfamethoxazole-trimethoprim]; Adhesive [tape]; and Latex    Social  History:  The patient  reports that she has never smoked. She has never used smokeless tobacco. She reports that she drinks alcohol. She reports that she does not use drugs.   Family History:  The patient's family history includes Breast cancer (age of onset: 7180) in her maternal aunt; Heart attack in her father; Hepatitis in her mother; Hydrocephalus in her father; Hypertension in her mother.    ROS:  Please see the history of present illness.   Otherwise, review of systems are positive for episode of dizziness as noted above.   All other systems are reviewed and negative.    PHYSICAL EXAM: VS:  BP (!) 144/98   Pulse 89   Ht 5\' 4"  (1.626 m)   Wt 154 lb 12.8 oz  (70.2 kg)   SpO2 98%   BMI 26.57 kg/m  , BMI Body mass index is 26.57 kg/m. GEN: Well nourished, well developed, in no acute distress  HEENT: normal  Neck: no JVD, carotid bruits, or masses Cardiac: RRR; no murmurs, rubs, or gallops,no edema  Respiratory:  clear to auscultation bilaterally, normal work of breathing GI: soft, nontender, nondistended, + BS MS: no deformity or atrophy  Skin: warm and dry, no rash Neuro:  Strength and sensation are intact Psych: euthymic mood, full affect   EKG:   The ekg ordered 9/26 demonstrates NSR, RBBB   Recent Labs: No results found for requested labs within last 8760 hours.   Lipid Panel    Component Value Date/Time   TRIG 138 08/14/2015 0500     Other studies Reviewed: Additional studies/ records that were reviewed today with results demonstrating: prior records reviewed; labs reviewed.   ASSESSMENT AND PLAN:  1. PAF: No symptoms of A. Fib.  Given prior bleeding issues, would not start anticoagulation at this time.  A. fib occurred when she was severely anemic from ulcer back in 2013.  2. Hyperlipidemia: She has been intolerant of daily statins.  We will try Crestor 10 mg daily.  Refer to Pharm.D. lipid clinic.  I am not sure if she would qualify for PCSK9 inhibitor.  LDL 201 in August 2019 3. Vagal reaction: Stay well-hydrated.  No further symptoms in the last few months.  Symptoms were classic for vasovagal type situation. 4. Borderline BP: Encouraged exercise.  Low-salt diet.  Hopefully, with the lifestyle changes, she can avoid medications.  30 minutes a day 5 days a week would be beneficial for exercise.  We also discussed that she should be slightly winded if trying to have a conversation with someone while walking.  That is how she would know she is walking enough.  Blood pressure can also be followed in the Pharm.D. clinic as well.   Current medicines are reviewed at length with the patient today.  The patient concerns regarding  her medicines were addressed.  The following changes have been made:  No change  Labs/ tests ordered today include:  No orders of the defined types were placed in this encounter.   Recommend 150 minutes/week of aerobic exercise Low fat, low carb, high fiber diet recommended  Disposition:   FU in 1 year   Signed, Lance MussJayadeep Varanasi, MD  06/08/2018 9:13 AM    [facility] [address], [place], [place]  [address] Phone: [phone]; Fax: [phone]

## 2018-07-08 ENCOUNTER — Ambulatory Visit: Payer: BLUE CROSS/BLUE SHIELD

## 2018-07-29 ENCOUNTER — Other Ambulatory Visit: Payer: Self-pay | Admitting: Interventional Cardiology

## 2018-07-29 ENCOUNTER — Ambulatory Visit (INDEPENDENT_AMBULATORY_CARE_PROVIDER_SITE_OTHER): Payer: BLUE CROSS/BLUE SHIELD | Admitting: Pharmacist

## 2018-07-29 VITALS — BP 138/80 | HR 72

## 2018-07-29 DIAGNOSIS — I10 Essential (primary) hypertension: Secondary | ICD-10-CM | POA: Diagnosis not present

## 2018-07-29 DIAGNOSIS — E785 Hyperlipidemia, unspecified: Secondary | ICD-10-CM

## 2018-07-29 HISTORY — DX: Essential (primary) hypertension: I10

## 2018-07-29 MED ORDER — ROSUVASTATIN CALCIUM 10 MG PO TABS: ORAL_TABLET | ORAL | 3 refills | Status: DC

## 2018-07-29 MED ORDER — ROSUVASTATIN CALCIUM 10 MG PO TABS
ORAL_TABLET | ORAL | 3 refills | Status: DC
Start: 2018-07-29 — End: 2018-07-29

## 2018-07-29 NOTE — Progress Notes (Signed)
Patient ID: STORMI GOUDEAU                 DOB: September 13, 1955                    MRN: [id number]     HPI: Jenna [name] is a 63 y.o. female patient referred to pharmacy clinic by Dr. Eldridge Dace. PMH is significant for PAF, HLD, and GI ulcer. She was seen in clinic in November and started on low dose rosuvastatin for her cholesterol. Low sodium diet and increased physical activity were encouraged to help control her BP which was elevated at 144/98. Patient presents today for HLD and HTN management.   Patient reports knee pain with pravastatin 40 mg daily and myalgia with simvastatin 20 mg daily which appear the day after taking statin. Symptoms disappear within few days of discontinuation. Patient reports taking Crestor 10 mg weekly on Sundays. Patient denies any muscle pain. Discussed limiting fat in diet, including dairy and red meat.  Patient does not monitor BP at home and denies headache, vision changes, or lightheadedness. Encouraged to monitor BP at home. In clinic, BP 138/80 and HR 72. When inquired about diet and exercise since the last visit, patient reports making minimal changes. Upon asking about frequency of Allegra-D use, patient reports using almost daily for sinus congestion. Informed patient that Allegra-D contains pseudoephedrine, which can increase her BP. However, patient reports lack of efficacy with Allegra. Patient also reports using Excedrin for headache from sinus congestion and arthritis.   Current Lipid Medications: Crestor 10 mg once a week Intolerances: pravastatin 40 daily (knee pain), simvastatin 20 mg daily (myalgia) Risk Factors: family history of CAD, baseline LDL > 190  LDL goal: <100 mg/dL  Current BP Medications: none BP goal: <130/80  Diet: Patient reports limited fried food intake due to heart burn and eating red meat couples times a week. Occasionally eats pizza and hamburgers. Eats 50/50 at home and out at [employer]. Patient denies adding salt at home.  Patient reports drinking 2-3 cans of soda daily for caffeine.  Exercise: Limited  Family History: Mother with HTN and hepatitis. Father with heart attack (age of onset: 80s) and hydrocephalus. Maternal aunt with breast cancer (age of onset: 56).  Social History: The patient  reports that she has never smoked. She has never used smokeless tobacco. She reports that she drinks alcohol (4 glasses of wine weekly). She reports that she does not use drugs.   Labs: (02/2018) TC 296, HDL 59, TG 177, LDL 201, nonHDL 236 (no lipid lowering therapy)  Past Medical History:  Diagnosis Date  . Allergic rhinitis   . Atrial fibrillation (HCC) 08/22/2013  . Colon polyp 2008   on last colonoscopy with Dr. Loreta Ave in 2008  . Deviated nasal septum   . Diverticulosis 2008   on last colonoscopy with Dr. Loreta Ave in 2008  . Gastric ulcer with hemorrhage   . GERD (gastroesophageal reflux disease)   . Hypercholesteremia   . Iron deficiency anemia    After ulcer in 2013, resolved  . Osteoarthritis   . Vitamin B12 deficiency     Current Outpatient Medications on File Prior to Visit  Medication Sig Dispense Refill  . fexofenadine-pseudoephedrine (ALLEGRA-D) 60-120 MG per tablet Take 1 tablet by mouth daily as needed (for allergies). Patient is using this medication for allergies.    . fluticasone (FLONASE) 50 MCG/ACT nasal spray Place 2 sprays into the nose daily as needed for allergies.     Marland Kitchen  rosuvastatin (CRESTOR) 10 MG tablet Take as directed: Take 1 tablet once a week 90 tablet 3   No current facility-administered medications on file prior to visit.     Allergies  Allergen Reactions  . Bactrim [Sulfamethoxazole-Trimethoprim] Other (See Comments)    Canker sores in her mouth.  . Adhesive [Tape] Rash and Other (See Comments)    Burns to the skin  . Latex Rash and Other (See Comments)    Burns to skin    Assessment/Plan:  1. Hyperlipidemia - Patient is above goal <100 mg/dL due to family history of heart  disease. Patient is tolerating Crestor 10 mg weekly. Will increase frequency of Crestor 10 mg with a goal of taking it daily. Instructed patient to increase the frequency of taking Crestor weekly for 5 weeks and to call clinic if experiencing any muscle pains. Will consider increasing Crestor if patient is tolerant to daily dose of Crestor. Otherwise, will consider adding Zetia. Will plan to check lipids 2-3 months after pt is taking highest tolerated dose of Crestor.  2. Hypertension - Patient is above goal <130/80 with clinic BP 138/80. Patient made minimal changes to her diet and physical activity. Patient is reluctant to start BP meds today. Will give another trial of making lifestyle modifications in her diet and exercise. Also encouraged patient to switch Allegra-D to Allegra and Excedrin to Tylenol, if possible. Discussed limiting soda (caffeine) intake to 1 can a day and sodium <2g per day. Will follow up BP at next visit in 5 weeks and will consider adding lisinopril if minimal lifestyle changes are made at next visit.  Patient seen with Marilu Favre, [employer]4 pharmacy student.   E. Supple, PharmD, BCACP, CPP [facility] [address] [place], [place] [address] Phone: [phone]; Fax: [phone] 07/29/2018 12:18 PM

## 2018-07-29 NOTE — Patient Instructions (Addendum)
Blood pressure goal: <130/80 Try to take Allegra without Sudafed  Try to take Tylenol for headache and arthritis Try to cut back on soda intake to 1 can a day Increase physical activity slowly to 30 minutes five times weekly Monitor and keep a log of home BP, if able Try to limit salt intake <2,000 mg daily  LDL goal: <100 mg/dL Try to increase frequency of Crestor 10 mg weekly over the next 4 weeks. The goal is to take daily, if possible.  Your next appointment will be in 5 weeks on 09/02/2018. At the next visit, we will re-assess your blood pressure and cholesterol level.

## 2018-08-07 ENCOUNTER — Emergency Department (HOSPITAL_BASED_OUTPATIENT_CLINIC_OR_DEPARTMENT_OTHER)
Admission: EM | Admit: 2018-08-07 | Discharge: 2018-08-07 | Disposition: A | Discharge status: Home / Self Care | Payer: No Typology Code available for payment source | Attending: Emergency Medicine | Admitting: Emergency Medicine

## 2018-08-07 ENCOUNTER — Other Ambulatory Visit: Payer: Self-pay

## 2018-08-07 ENCOUNTER — Encounter (HOSPITAL_BASED_OUTPATIENT_CLINIC_OR_DEPARTMENT_OTHER): Payer: Self-pay | Admitting: Emergency Medicine

## 2018-08-07 DIAGNOSIS — I10 Essential (primary) hypertension: Secondary | ICD-10-CM | POA: Diagnosis not present

## 2018-08-07 DIAGNOSIS — Y939 Activity, unspecified: Secondary | ICD-10-CM | POA: Diagnosis not present

## 2018-08-07 DIAGNOSIS — Y990 Civilian activity done for income or pay: Secondary | ICD-10-CM | POA: Insufficient documentation

## 2018-08-07 DIAGNOSIS — Y9289 Other specified places as the place of occurrence of the external cause: Secondary | ICD-10-CM | POA: Insufficient documentation

## 2018-08-07 DIAGNOSIS — S81811A Laceration without foreign body, right lower leg, initial encounter: Secondary | ICD-10-CM | POA: Diagnosis not present

## 2018-08-07 DIAGNOSIS — Z91040 Latex allergy status: Secondary | ICD-10-CM | POA: Diagnosis not present

## 2018-08-07 DIAGNOSIS — W269XXA Contact with unspecified sharp object(s), initial encounter: Secondary | ICD-10-CM | POA: Insufficient documentation

## 2018-08-07 MED ORDER — LIDOCAINE-EPINEPHRINE (PF) 2 %-1:200000 IJ SOLN
10.0000 mL | Freq: Once | INTRAMUSCULAR | Status: AC
  Administered 2018-08-07: 10 mL via INTRADERMAL
  Filled 2018-08-07 (×2): qty 10

## 2018-08-07 NOTE — ED Provider Notes (Signed)
[facility] EMERGENCY DEPARTMENT Provider Note   CSN: [id number] Arrival date & time: 08/07/18  1337     History   Chief Complaint No chief complaint on file.   HPI Jenna [name] is a 63 y.o. female.  Patient presents to the emergency department with complaint of laceration, acute onset, while at work today.  She states that she got up from her chair at work and cut her calf area on an unknown object.  States that tetanus is up-to-date.  Bandages were applied prior to arrival, no other treatment.  No other injuries reported.  Bleeding has resolved.     Past Medical History:  Diagnosis Date  . Allergic rhinitis   . Atrial fibrillation (HCC) 08/22/2013  . Colon polyp 2008   on last colonoscopy with Dr. Loreta AveMann in 2008  . Deviated nasal septum   . Diverticulosis 2008   on last colonoscopy with Dr. Loreta AveMann in 2008  . Gastric ulcer with hemorrhage   . GERD (gastroesophageal reflux disease)   . Hypercholesteremia   . Hypertension 07/29/2018  . Iron deficiency anemia    After ulcer in 2013, resolved  . Osteoarthritis   . Vitamin B12 deficiency     Patient Active Problem List   Diagnosis Date Noted  . Hypertension 07/29/2018  . 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 Atrial fibrillation (HCC) 08/22/2013  . Gastric ulcer with hemorrhage 08/22/2013  . Hypercholesteremia   . Vitamin B12 deficiency   . Microcytic anemia 02/26/2012  . Hyperlipidemia 02/26/2012  . Palpitations 02/26/2012    Past Surgical History:  Procedure Laterality Date  . CESAREAN SECTION    . ESOPHAGOGASTRODUODENOSCOPY N/A 08/10/2015   Procedure: ESOPHAGOGASTRODUODENOSCOPY (EGD);  Surgeon: Jeani HawkingPatrick Hung, MD;  [facility] END[facility]COPY;  Service: Endoscopy;  Laterality: N/A;  . HIATAL HERNIA REPAIR  08/15/2015  . LAPAROSCOPIC NISSEN FUNDOPLICATION  08/15/2015  . LAPAROSCOPIC NISSEN FUNDOPLICATION N/A 08/15/2015   Procedure: LAPAROSCOPIC HIATAL HERNIA REPAIR  AND NISSEN FUNDOPLICATION;  Surgeon: Axel FillerArmando Ramirez, MD;  Location: [facility] OR;  Service: General;  Laterality: N/A;  . NASAL SINUS SURGERY    . TONSILLECTOMY       OB History    Gravida  3   Para  2   Term  2   Preterm      AB      Living  2     SAB      TAB      Ectopic      Multiple      Live Births               Home Medications    Prior to Admission medications   Medication Sig Start Date End Date Taking? Authorizing Provider  fexofenadine-pseudoephedrine (ALLEGRA-D) 60-120 MG per tablet Take 1 tablet by mouth daily as needed (for allergies). Patient is using this medication for allergies.    [provider]  fluticasone (FLONASE) 50 MCG/ACT nasal spray Place 2 sprays into the nose daily as needed for allergies.     [provider]  rosuvastatin (CRESTOR) 10 MG tablet Take 1 tablet by mouth daily or as tolerated 07/29/18   Corky CraftsVaranasi, Jayadeep S, MD    Family History Family History  Problem Relation Age of Onset  . Hypertension Mother   . Hepatitis Mother   . Heart attack Father   . Hydrocephalus Father   . Breast cancer Maternal Aunt 8480    Social History Social History  Tobacco Use  . Smoking status: Never Smoker  . Smokeless tobacco: Never Used  Substance Use Topics  . Alcohol use: Yes    Comment: weekly  . Drug use: No     Allergies   Bactrim [sulfamethoxazole-trimethoprim]; Adhesive [tape]; and Latex   Review of Systems Review of Systems  Musculoskeletal: Negative for myalgias.  Skin: Positive for wound.  Neurological: Negative for numbness.     Physical Exam Updated Vital Signs BP (!) 151/101 (BP Location: Left Arm)   Pulse 88   Temp 98.1 F (36.7 C) (Oral)   Resp 20   Ht 5\' 3"  (1.6 m)   Wt 68.9 kg   SpO2 98%   BMI 26.93 kg/m   Physical Exam Vitals signs and nursing note reviewed.  Constitutional:      Appearance: She is well-developed.  HENT:     Head: Normocephalic and atraumatic.  Eyes:      Conjunctiva/sclera: Conjunctivae normal.  Neck:     Musculoskeletal: Normal range of motion and neck supple.  Pulmonary:     Effort: No respiratory distress.  Skin:    General: Skin is warm and dry.     Comments: Patient with 2 cm, mildly gaping, flap/skin tear laceration noted to the posterior right calf.  No active bleeding.  Wound base is clean.  Wound is shallow without any subcutaneous involvement.  No underlying tendon or nerve injury visualized.  Neurological:     Mental Status: She is alert.      ED Treatments / Results  Labs (all labs ordered are listed, but only abnormal results are displayed) Labs Reviewed - No data to display  EKG None  Radiology No results found.  Procedures .Marland Kitchen.Laceration Repair Date/Time: 08/07/2018 2:35 PM Performed by: Renne CriglerGeiple, Antoninette Lerner, PA-C Authorized by: Renne CriglerGeiple, Adama Ferber, PA-C   Consent:    Consent obtained:  Verbal   Consent given by:  Patient   Risks discussed:  Infection, pain, poor cosmetic result and poor wound healing   Alternatives discussed:  No treatment Anesthesia (see MAR for exact dosages):    Anesthesia method:  Local infiltration   Local anesthetic:  Lidocaine 2% WITH epi Laceration details:    Location:  Leg   Leg location:  L lower leg   Length (cm):  2 Pre-procedure details:    Preparation:  Patient was prepped and draped in usual sterile fashion Exploration:    Hemostasis achieved with:  LET and direct pressure   Wound exploration: wound explored through full range of motion and entire depth of wound probed and visualized     Contaminated: no   Treatment:    Area cleansed with:  Shur-Clens   Amount of cleaning:  Extensive   Visualized foreign bodies/material removed: no   Skin repair:    Repair method:  Sutures   Suture size:  5-0   Suture material:  Nylon   Suture technique:  Simple interrupted   Number of sutures:  3 Approximation:    Approximation:  Close Post-procedure details:    Dressing:  Adhesive  bandage   Patient tolerance of procedure:  Tolerated well, no immediate complications   (including critical care time)  Medications Ordered in ED Medications  lidocaine-EPINEPHrine (XYLOCAINE W/EPI) 2 %-1:200000 (PF) injection 10 mL (has no administration in time range)     Initial Impression / Assessment and Plan / ED Course  I have reviewed the triage vital signs and the nursing notes.  Pertinent labs & imaging results that were available during my  care of the patient were reviewed by me and considered in my medical decision making (see chart for details).     Patient seen and examined. Medications ordered.  Discussed closure with sutures versus Steri-Strips.  Patient would like to have sutures placed.  Vital signs reviewed and are as follows: BP (!) 151/101 (BP Location: Left Arm)   Pulse 88   Temp 98.1 F (36.7 C) (Oral)   Resp 20   Ht 5\' 3"  (1.6 m)   Wt 68.9 kg   SpO2 98%   BMI 26.93 kg/m   Patient counseled on wound care. Patient counseled on need to return or see PCP/urgent care for suture removal in 10-14 days. Patient was urged to return to the Emergency Department urgently with worsening pain, swelling, expanding erythema especially if it streaks away from the affected area, fever, or if they have any other concerns. Patient verbalized understanding.    Final Clinical Impressions(s) / ED Diagnoses   Final diagnoses:  Laceration of right lower extremity, initial encounter   Patient with minor skin tear from injury on the right calf area.  Wound was clean and very superficial.  There was tension on the wound causing the flap segment to wrinkle up.  For this reason I place stitches versus Steri-Strips.  Patient tolerated well.  Tetanus is reportedly up-to-date.  ED Discharge Orders    None       Renne Crigler, PA-C 08/07/18 1437    Charlynne Pander, MD 08/07/18 [phone]

## 2018-08-07 NOTE — ED Triage Notes (Signed)
Pt has right calf laceration that occurred while at work today.

## 2018-08-07 NOTE — Discharge Instructions (Signed)
Please read and follow all provided instructions.  Your diagnoses today include:  1. Laceration of right lower extremity, initial encounter     Tests performed today include:  Vital signs. See below for your results today.   Medications prescribed:   None  Take any prescribed medications only as directed.   Home care instructions:  Follow any educational materials and wound care instructions contained in this packet.   Keep affected area above the level of your heart when possible to minimize swelling. Wash area gently twice a day with warm soapy water. Do not apply alcohol or hydrogen peroxide. Cover the area if it draining or weeping.   Follow-up instructions: Suture Removal: Return to the Emergency Department or see your primary care care doctor in 10-14 days for a recheck of your wound and removal of your sutures or staples.    Return instructions:  Return to the Emergency Department if you have:  Fever  Worsening pain  Worsening swelling of the wound  Pus draining from the wound  Redness of the skin that moves away from the wound, especially if it streaks away from the affected area   Any other emergent concerns  Your vital signs today were: BP (!) 151/101 (BP Location: Left Arm)    Pulse 88    Temp 98.1 F (36.7 C) (Oral)    Resp 20    Ht 5\' 3"  (1.6 m)    Wt 68.9 kg    SpO2 98%    BMI 26.93 kg/m  If your blood pressure (BP) was elevated above 135/85 this visit, please have this repeated by your doctor within one month. --------------

## 2018-08-20 DIAGNOSIS — M1712 Unilateral primary osteoarthritis, left knee: Secondary | ICD-10-CM | POA: Diagnosis not present

## 2018-08-20 DIAGNOSIS — M25561 Pain in right knee: Secondary | ICD-10-CM | POA: Diagnosis not present

## 2018-08-20 DIAGNOSIS — M170 Bilateral primary osteoarthritis of knee: Secondary | ICD-10-CM | POA: Diagnosis not present

## 2018-08-20 DIAGNOSIS — M25562 Pain in left knee: Secondary | ICD-10-CM | POA: Diagnosis not present

## 2018-09-02 ENCOUNTER — Ambulatory Visit (INDEPENDENT_AMBULATORY_CARE_PROVIDER_SITE_OTHER): Payer: BLUE CROSS/BLUE SHIELD | Admitting: Pharmacist

## 2018-09-02 VITALS — BP 130/88 | HR 74

## 2018-09-02 DIAGNOSIS — E785 Hyperlipidemia, unspecified: Secondary | ICD-10-CM | POA: Diagnosis not present

## 2018-09-02 DIAGNOSIS — I10 Essential (primary) hypertension: Secondary | ICD-10-CM

## 2018-09-02 LAB — LIPID PANEL
Chol/HDL Ratio: 3.8 ratio (ref 0.0–4.4)
Cholesterol, Total: 280 mg/dL — ABNORMAL HIGH (ref 100–199)
HDL: 74 mg/dL (ref >39)
LDL Calculated: 171 mg/dL — ABNORMAL HIGH (ref 0–99)
Triglycerides: 175 mg/dL — ABNORMAL HIGH (ref 0–149)
VLDL Cholesterol Cal: 35 mg/dL (ref 5–40)

## 2018-09-02 MED ORDER — AMLODIPINE BESYLATE 5 MG PO TABS: 5.0000 mg | ORAL_TABLET | Freq: Every day | ORAL | 3 refills | Status: DC

## 2018-09-02 NOTE — Patient Instructions (Addendum)
It was nice to see you today  Your blood pressure goal is <130/57mmHg  Start taking amlodipine 5mg  once a day every night for your blood pressure  Look for caffeine free soda  Monitor your blood pressure at home and bring in your home cuff and readings to your next visit  I will call you when the new cholesterol medication is available (bempedoic acid combined with Zetia)

## 2018-09-02 NOTE — Progress Notes (Signed)
Patient ID: Jenna FramesSusan L [name]                 DOB: 06-25-1956                    MRN: [id number]     HPI: Jenna FramesSusan L [name] is a 63 y.o. female patient referred to pharmacy clinic by Dr. Eldridge DaceVaranasi. PMH is significant for PAF, HLD, and GI ulcer. She was seen in clinic in November and started on low dose rosuvastatin for her cholesterol. Low sodium diet and increased physical activity were encouraged to help control her BP which was elevated at 144/98. At last visit in pharmacy clinic 1 month ago, pt was advised to increase rosuvastatin frequency to daily. BP was also mildly elevated at 130/80. Pt uses Allegra-D and Excedrin daily and was encouraged to decrease use as both can increase BP.   Patient presents today in good spirits. She has decreased Excedrin intake from daily to a few days a week (using Tylenol on the other days). She is still using Allegra D daily as she reports that regular Allegra does not work for her. She has decreased soda intake from 3 cans to 2 cans daily. She has checked her BP a few times but does not recall specific readings other than "they looked normal." Reports she has not been very active.  Reports charley horses when she increased her rosuvastatin from once weekly to twice weekly. She previously experienced knee pain with pravastatin 40 mg daily and myalgias with simvastatin 20 mg daily which appeared the day after taking statin. Symptoms disappear within few days of discontinuation.   Current Lipid Medications: Crestor 10 mg once a week Intolerances: pravastatin 40 daily (knee pain), simvastatin 20 mg daily (myalgias) Risk Factors: family history of CAD, baseline LDL > 190  LDL goal: <100 mg/dL  Current BP Medications: none BP goal: <130/80  Diet: Patient reports limited fried food intake due to heart burn and eating red meat couples times a week. Occasionally eats pizza and hamburgers. Eats 50/50 at home and out at [employer]. Patient denies adding salt at home.  Patient reports drinking 2-3 cans of soda daily for caffeine.  Exercise: Limited  Family History: Mother with HTN and hepatitis. Father with heart attack (age of onset: 1270s) and hydrocephalus. Maternal aunt with breast cancer (age of onset: 2380).  Social History: The patient  reports that she has never smoked. She has never used smokeless tobacco. She reports that she drinks alcohol (4 glasses of wine weekly). She reports that she does not use drugs.   Labs: (02/2018) TC 296, HDL 59, TG 177, LDL 201, nonHDL 236 (no lipid lowering therapy)  Past Medical History:  Diagnosis Date  . Allergic rhinitis   . Atrial fibrillation (HCC) 08/22/2013  . Colon polyp 2008   on last colonoscopy with Dr. Loreta AveMann in 2008  . Deviated nasal septum   . Diverticulosis 2008   on last colonoscopy with Dr. Loreta AveMann in 2008  . Gastric ulcer with hemorrhage   . GERD (gastroesophageal reflux disease)   . Hypercholesteremia   . Hypertension 07/29/2018  . Iron deficiency anemia    After ulcer in 2013, resolved  . Osteoarthritis   . Vitamin B12 deficiency     Current Outpatient Medications on File Prior to Visit  Medication Sig Dispense Refill  . fexofenadine-pseudoephedrine (ALLEGRA-D) 60-120 MG per tablet Take 1 tablet by mouth daily as needed (for allergies). Patient is using this medication for allergies.    .Marland Kitchen  fluticasone (FLONASE) 50 MCG/ACT nasal spray Place 2 sprays into the nose daily as needed for allergies.     . rosuvastatin (CRESTOR) 10 MG tablet Take 1 tablet by mouth daily or as tolerated 90 tablet 3   No current facility-administered medications on file prior to visit.     Allergies  Allergen Reactions  . Bactrim [Sulfamethoxazole-Trimethoprim] Other (See Comments)    Canker sores in her mouth.  . Adhesive [Tape] Rash and Other (See Comments)    Burns to the skin  . Latex Rash and Other (See Comments)    Burns to skin    Assessment/Plan:  1. Hyperlipidemia - Pt continues on highest tolerated  statin dose of rosuvastatin 10mg  once weekly. Will check fasting lipid panel today as previous lab work showed baseline LDL > 200 on no lipid lowering therapy. She is previously intolerant to pravastatin and simvastatin. Discussed bempedoic acid which should be approved within the next few weeks. Pt would like to try this. Will call her once FDA has approved and will plan to start bempedoic acid-Zetia combination pill to target LDL goal < 100.  2. Hypertension - Patient has improved, however diastolic reading remains above goal. She is willing to start amlodipine 5mg  daily to target BP < 130/6680mmHg. Encouraged her to look for caffeine free sodas and increase activity level. Will recheck BP in 1 month.   Megan E. Supple, PharmD, BCACP, CPP  [facility] [address], [place], [place] [address] Phone: [phone]) [phone]; Fax: [phone]) [phone] 09/02/2018 7:35 AM

## 2018-09-14 ENCOUNTER — Other Ambulatory Visit: Payer: Self-pay

## 2018-09-14 MED ORDER — AMLODIPINE BESYLATE 5 MG PO TABS: 5.0000 mg | ORAL_TABLET | Freq: Every day | ORAL | 3 refills | Status: DC

## 2018-09-20 ENCOUNTER — Telehealth: Payer: Self-pay | Admitting: Interventional Cardiology

## 2018-09-20 NOTE — Telephone Encounter (Signed)
Pt seen 09/02/18 in HTN clinic with [facility], Pharm-D where Amlodipine was started. I will route to Pharm-D as well as FYI to [place] RN for Dr. Eldridge Dace who is primary card.

## 2018-09-20 NOTE — Telephone Encounter (Signed)
Returned call to pt and LMOM to discuss.  Amlodipine should not be causing jittery feeling, increase in HR, or difficulty sleeping. This is more likely coming from her daily Allegra-D use and 2-3 caffeinated sodas each day. Would recommend that she continue amlodipine and take her Allegra-D in the morning (previously discussed discontinuing this however she states that she needs to take the Allegra with Sudafed every day for year round allergy control) and limiting caffeine further. If she is unable to, she can try any other BP medication like chlorthalidone or lisinopril instead. Will await patient's return phone call.

## 2018-09-20 NOTE — Telephone Encounter (Signed)
  Pt c/o medication issue:  1. Name of Medication: amLODipine (NORVASC) 5 MG tablet  2. How are you currently taking this medication (dosage and times per day)? Take 1 tablet (5 mg total) by mouth daily.  3. Are you having a reaction (difficulty breathing--STAT)? Jittery feeling, HR fast, couldn't sleep  4. What is your medication issue? Patient took medication Friday night and was awake all night, jittery and HR went up. This morning she took half a pill and is not having symptoms like she had on Friday night. She did not take the pill on Saturday or Sunday. She would like to know what she should do.

## 2018-09-21 NOTE — Telephone Encounter (Signed)
Spoke with pt - she states she just received her amlodipine last Friday and after taking 1 dose, reports that her heart started beating faster, she couldn't sleep, and she felt jittery. She did not have more caffeine than normal and takes her Allegra-D in the morning. She took 1/2 tablet yesterday and felt better. Advised pt to continue with amlodipine 2.5mg  daily for the next week, then try increasing back to 5mg  if her BP remains elevated > 130/33mmHg. She will keep f/u in HTN clinic on 3/12.

## 2018-09-22 DIAGNOSIS — J0100 Acute maxillary sinusitis, unspecified: Secondary | ICD-10-CM | POA: Diagnosis not present

## 2018-09-30 ENCOUNTER — Ambulatory Visit (INDEPENDENT_AMBULATORY_CARE_PROVIDER_SITE_OTHER): Payer: BLUE CROSS/BLUE SHIELD | Admitting: Pharmacist

## 2018-09-30 ENCOUNTER — Other Ambulatory Visit: Payer: Self-pay

## 2018-09-30 VITALS — BP 110/80 | HR 80

## 2018-09-30 DIAGNOSIS — I10 Essential (primary) hypertension: Secondary | ICD-10-CM

## 2018-09-30 MED ORDER — LISINOPRIL 10 MG PO TABS: 10.0000 mg | ORAL_TABLET | Freq: Every day | ORAL | 3 refills | Status: DC

## 2018-09-30 NOTE — Progress Notes (Signed)
Patient ID: Jenna [name]                 DOB: [birth year]                    MRN: [id number]     HPI: Jenna [name] is a 63 y.o. female patient referred to pharmacy clinic by Dr. Eldridge Dace. PMH is significant for PAF, HLD, and GI ulcer. She was seen in clinic in November and started on low dose rosuvastatin for her cholesterol. Low sodium diet and increased physical activity were encouraged to help control her BP. At last visit in pharmacy clinic 1 month ago, she was started on amlodipine 5 mg daily and continued on rosuvastatin once weekly, her BP 130/88. She called in after taking one dose complaining of tachycardia, insomnia, and feeling jittery. She was decreased to amlodipine 2.5 mg daily and has continued on that dose.  She presents today in good spirits. She continues on amlodipine 2.5 mg daily but reports difficulty sleeping (despite taking at bedtime vs evening) and somnolence when taken in the morning. Per recall, blood pressures have improved, most recently 116/80 mmHg. Highest 135/95 mmHg. She continues taking Excedrin regularly and Allegra D daily. She continues to drink soda twice a day. She is hesitant to decrease caffeine intake further as she feels it is important to her energy levels. She drank a soda on the drive over and did not take amlodipine last night.  Reports she has not been very active apart from staying on feet at work.  She reports charley horses when she increased her rosuvastatin from once weekly to twice weekly. She previously experienced knee pain with pravastatin 40 mg daily and myalgias with simvastatin 20 mg daily which appeared the day after taking statin. Symptoms disappear within few days of discontinuation. Discussed bempedoic acid-zetia at last clinic visit.  Current Lipid Medications: Crestor 10 mg once a week Intolerances: pravastatin 40 daily (knee pain), simvastatin 20 mg daily (myalgias) Risk Factors: family history of CAD, baseline LDL > 190  LDL  goal: <100 mg/dL  Current BP Medications: amlodipine 2.5 mg daily BP goal: <130/80 Home BP: Per recall, blood pressures have improved, most recently 116/80 mmHg. Highest 135/95 mmHg.  Diet: Patient reports limited fried food intake due to heart burn and eating red meat couples times a week. Occasionally eats pizza and hamburgers. Eats 50/50 at home and out at a restaurant (potato skins, deli sandwiches). Eats canned vegetables once or twice a week. Patient denies adding salt at home. Patient reports drinking 2 cans of soda daily for caffeine.  Exercise: Limited- works 4 days a week at [employer] on feet all day  Family History: Mother with HTN and hepatitis. Father with heart attack (age of onset: 71s) and hydrocephalus. Maternal aunt with breast cancer (age of onset: 85).  Social History: The patient  reports that she has never smoked. She has never used smokeless tobacco. She reports that she drinks alcohol (4 glasses of wine weekly). She reports that she does not use drugs.   Labs:  08/2018: TC 280, HDL 74, TG 175, LDL 171, nonHDL 206 (rosuvastatin 10 mg once weekly) 02/2018: TC 296, HDL 59, TG 177, LDL 201, nonHDL 236 (no lipid lowering therapy)   Past Medical History:  Diagnosis Date  . Allergic rhinitis   . Atrial fibrillation (HCC) 08/22/2013  . Colon polyp 2008   on last colonoscopy with Dr. Loreta Ave in 2008  . Deviated nasal septum   .  Diverticulosis 2008   on last colonoscopy with Dr. Loreta Ave in 2008  . Gastric ulcer with hemorrhage   . GERD (gastroesophageal reflux disease)   . Hypercholesteremia   . Hypertension 07/29/2018  . Iron deficiency anemia    After ulcer in 2013, resolved  . Osteoarthritis   . Vitamin B12 deficiency     Current Outpatient Medications on File Prior to Visit  Medication Sig Dispense Refill  . amLODipine (NORVASC) 5 MG tablet Take 1 tablet (5 mg total) by mouth daily. 90 tablet 3  . fexofenadine-pseudoephedrine (ALLEGRA-D) 60-120 MG per tablet Take 1  tablet by mouth daily as needed (for allergies). Patient is using this medication for allergies.    . fluticasone (FLONASE) 50 MCG/ACT nasal spray Place 2 sprays into the nose daily as needed for allergies.     . rosuvastatin (CRESTOR) 10 MG tablet Take 1 tablet by mouth daily or as tolerated 90 tablet 3   No current facility-administered medications on file prior to visit.     Allergies  Allergen Reactions  . Bactrim [Sulfamethoxazole-Trimethoprim] Other (See Comments)    Canker sores in her mouth.  . Adhesive [Tape] Rash and Other (See Comments)    Burns to the skin  . Latex Rash and Other (See Comments)    Burns to skin    Assessment/Plan:  1. Hypertension - Blood pressure is at goal in clinic at < 130/80 mmHg. She has been taking amlodipine 2.5 mg regularly but did not take last night. She did not bring in home readings today, per recall they have been more controlled with amlodipine (most recently 116/80 mmHg). She complains of insomnia since starting amlodipine that persisted despite shifting when she was taking it. When she took it in the morning she experienced somnolence. Will discontinue amlodipine and start lisinopril 10 mg daily. Most recent BMET from PCP stable (Scr 0.76, K 4, 02/2018). Encouraged her to look for caffeine free sodas and increase activity level through walking regularly. Encouraged her to eat more meals at home and to chose lower salt options when eating out (salads vs potato skins). Will see back in clinic in 1 month for BP check and lab follow up.   2. Hyperlipidemia - Pt continues on highest tolerated statin dose of rosuvastatin 10mg  once weekly. LDL has decreased 15% but remains above goal of < 100. She is previously intolerant to pravastatin and simvastatin. Discussed bempedoic acid at last visit which has been approved by the FDA but is not available for purchase. Pt would like to try this. Will plan to start bempedoic acid-Zetia combination pill at next visit,  anticipate it will be available at that time.  Marcelino Freestone, PharmD PGY2 Cardiology Pharmacy Resident 09/30/2018 11:25 AM

## 2018-09-30 NOTE — Patient Instructions (Signed)
It was nice to meet you today.  We will stop your amlodipine.  We will start lisinopril 10 mg daily. Continue to check your blood pressure at home multiple times a week. Continue to work towards limiting salt intake, limiting caffeine intake, and increasing your activity.   We will see you back in a month with labs (you do not need to be fasting).

## 2018-10-26 ENCOUNTER — Telehealth: Payer: Self-pay | Admitting: Pharmacist

## 2018-10-26 MED ORDER — EZETIMIBE 10 MG PO TABS: 10.0000 mg | ORAL_TABLET | Freq: Every day | ORAL | 3 refills | Status: DC

## 2018-10-26 NOTE — Telephone Encounter (Addendum)
Patient ID: ZITONG POPKO                 DOB: [birth year]                      MRN: [id number]     HPI: Jenna [name] is a 63 y.o. female patient referred to pharmacy clinic by Dr. Eldridge Dace. PMH is significant for PAF, HLD, and GI ulcer. She was seen in clinic in November and started on low dose rosuvastatin for her cholesterol. Low sodium diet and increased physical activity were encouraged to help control her BP. At last visit in pharmacy clinic 1 month ago, blood pressure was at goal, but her amlodipine was stopped due to insomnia when taken at night and sonolence when taking in the AM. Lisinopril 10mg  daily was started.  Todays visit is done over the phone due to COVID 19 pandemic.  Patient is still taking excedrine and pseudophed pretty regularly. She was counseled to use sparingly. Some of her blood pressures readings were after taking pseudophed and they were still below goal.  Patient denies any dizziness, lightheadedness or orthostatics.  She reports charley horses when she increased her rosuvastatin from once weekly to twice weekly. She previously experienced knee pain with pravastatin 40 mg daily and myalgias with simvastatin 20 mg daily which appeared the day after taking statin. Symptoms disappear within few days of discontinuation. Patient has since stopped taking rosuvastatin all together as she states she is waiting for the new agent (bempedoic acid-zetia) to be released.  Current Lipid Medications: none Intolerances: pravastatin 40 daily (knee pain), simvastatin 20 mg daily (myalgias) Risk Factors: family history of CAD, baseline LDL > 190  LDL goal: <100 mg/dL  Current BP Medications: lisinopril 10mg  daily BP goal: <130/80 Home BP: 96/63, 107/74 (after taking pseudophed), 129/81 (had a headache)   Diet: Patient reports limited fried food intake due to heart burn and eating red meat couples times a week. Occasionally eats pizza and hamburgers. Eats 50/50 at home and out  at a restaurant (potato skins, deli sandwiches). Eats canned vegetables once or twice a week. Patient denies adding salt at home. Patient reports drinking 2 cans of soda daily for caffeine.  Exercise: Limited- works 4 days a week at [employer] on feet all day  Family History: Mother with HTN and hepatitis. Father with heart attack (age of onset: 68s) and hydrocephalus. Maternal aunt with breast cancer (age of onset: 36).  Social History: The patientreports that she has never smoked. She has never used smokeless tobacco. She reports that she drinks alcohol (4 glasses of wine weekly). She reports that she does not use drugs  Labs:  08/2018: TC 280, HDL 74, TG 175, LDL 171, nonHDL 206 (rosuvastatin 10 mg once weekly) 02/2018: TC 296, HDL 59, TG 177, LDL 201, nonHDL 236 (no lipid lowering therapy)  Wt Readings from Last 3 Encounters:  08/07/18 152 lb (68.9 kg)  06/08/18 154 lb 12.8 oz (70.2 kg)  08/10/15 165 lb (74.8 kg)   BP Readings from Last 3 Encounters:  09/30/18 110/80  09/02/18 130/88  08/07/18 (!) 151/101   Pulse Readings from Last 3 Encounters:  09/30/18 80  09/02/18 74  08/07/18 88    Renal function: CrCl cannot be calculated (Patient's most recent lab result is older than the maximum 21 days allowed.).  Past Medical History:  Diagnosis Date  . Allergic rhinitis   . Atrial fibrillation (HCC) 08/22/2013  . Colon polyp 2008  on last colonoscopy with Dr. Loreta AveMann in 2008  . Deviated nasal septum   . Diverticulosis 2008   on last colonoscopy with Dr. Loreta AveMann in 2008  . Gastric ulcer with hemorrhage   . GERD (gastroesophageal reflux disease)   . Hypercholesteremia   . Hypertension 07/29/2018  . Iron deficiency anemia    After ulcer in 2013, resolved  . Osteoarthritis   . Vitamin B12 deficiency     Current Outpatient Medications on File Prior to Visit  Medication Sig Dispense Refill  . fexofenadine-pseudoephedrine (ALLEGRA-D) 60-120 MG per tablet Take 1 tablet by mouth  daily as needed (for allergies). Patient is using this medication for allergies.    . fluticasone (FLONASE) 50 MCG/ACT nasal spray Place 2 sprays into the nose daily as needed for allergies.     Marland Kitchen. lisinopril (PRINIVIL,ZESTRIL) 10 MG tablet Take 1 tablet (10 mg total) by mouth daily. 90 tablet 3  . rosuvastatin (CRESTOR) 10 MG tablet Take 1 tablet by mouth daily or as tolerated 90 tablet 3   No current facility-administered medications on file prior to visit.     Allergies  Allergen Reactions  . Bactrim [Sulfamethoxazole-Trimethoprim] Other (See Comments)    Canker sores in her mouth.  . Adhesive [Tape] Rash and Other (See Comments)    Burns to the skin  . Latex Rash and Other (See Comments)    Burns to skin     Assessment/Plan:  1. Hypertension - Patient blood pressure is at goal of <130/80. Continue lisinopril 10mg  daily. Patient denies any symptoms of low blood pressure. Will get BMP once able to be seen in office again.  2. Hyperlipidemia- Patient LDL is above goal of <100. She has stopped taking rosuvastatin weekly. I explained the importance of being on statin therapy and that the new medication should be added to the statin, but patient states that she doesn't tolerate the statins and doesn't want to take. Will order Nexlatol for patient as Nexlinet not yet available. Not convinced this will get patient to goal, but this is the route patient wanted to take. Will need to readdress statin or discuss PCSK9 if LDL is not close to goal on Nexlizet. Lipids in about 3 months.   Thank you  Olene FlossMelissa D , Pharm.D, BCPS [facility]  [address], [place], [place] [address]  Phone: [phone]; Fax: [phone]

## 2018-10-27 NOTE — Addendum Note (Signed)
Addended by: Malena Peer D on: 10/27/2018 08:21 AM   Modules accepted: Orders

## 2018-10-27 NOTE — Addendum Note (Signed)
Addended by: Lacinda Curvin E on: 10/27/2018 04:22 PM   Modules accepted: Orders

## 2018-10-28 ENCOUNTER — Ambulatory Visit: Payer: BLUE CROSS/BLUE SHIELD

## 2018-11-02 ENCOUNTER — Telehealth: Payer: Self-pay | Admitting: Pharmacist

## 2018-11-02 NOTE — Telephone Encounter (Addendum)
Patient left message stating all the pharmacy told her was that Nexletol was not covered. Likely needs a PA, need patient's pharmacy coverage for 2020 to submit. Will call pt in the AM to obtain.

## 2018-11-02 NOTE — Telephone Encounter (Signed)
Patient called stating her pharmacy doesn't cover Nexletol. Returned patients call and left VM to return our call. Need to figure out if med needs PA.

## 2018-11-03 MED ORDER — EVOLOCUMAB 140 MG/ML ~~LOC~~ SOAJ: 1.0000 "pen " | SUBCUTANEOUS | 11 refills | Status: DC

## 2018-11-03 NOTE — Addendum Note (Signed)
Addended by: Sheana Bir E on: 11/03/2018 09:58 AM   Modules accepted: Orders

## 2018-11-03 NOTE — Telephone Encounter (Signed)
Spoke with pt, insurance for 2020 as follows:  [employer] ID: [id number] BIN: [id number] PCN:  GRP: [id number]  Prior authorization attempted, unable to submit as medication is not on formulary. Called insurance plan, Nexletol remains non-formulary and they will not cover it.  Called pt to discuss PCSK9i therapy as this would be more potent and bring LDL closer to goal than Nexletol in the long run. She is in agreement with starting PCSK9i therapy. Repatha PA submitted and approved through 11/03/19. Activated $5 copay card and provided this info to pharmacy. Pt will call with concerns.

## 2018-12-27 ENCOUNTER — Telehealth: Payer: Self-pay | Admitting: Pharmacist

## 2018-12-27 NOTE — Telephone Encounter (Signed)
Left message for patient to return call. Calling to see how patient was going on Repatha and to set up labwork for the end of the month or beginning on next month

## 2018-12-28 NOTE — Telephone Encounter (Signed)
Pt returned call to clinic - she had 2 doses of Repatha back in March but lost her job due to COVID-19. She states she will have a new insurance plan starting in July (temporary) then will switch to Medicare in August. Pt will pick up 2 samples of Repatha this Thursday AM to cover her through June. She will call us when she has her new insurance for July.

## 2018-12-30 NOTE — Telephone Encounter (Signed)
Pt called clinic again - reports she had some GI upset after she gave her first injection of Repatha a few months ago. She still had 1 left at home and gave her 2nd injection after we spoke 2 days ago. She reports experiencing bloody diarrhea yesterday and wonders if this was related to the [place]. Advised her that Repatha does not cause this side effect and to follow up with her PCP - she states she already called PCP this morning. Advised pt to provide an update after she has work up to decide if she can continue on Repatha.

## 2019-01-12 NOTE — Telephone Encounter (Signed)
Pt called clinic and left a message - she would like to pick up Repatha samples on Monday morning. Called pt back and advised her we will leave 2 samples downstairs Monday morning which will last through July. Advised pt to call when she gets her new Medicare insurance in August so that we can [place]. Advised her to call back with any concerns.

## 2019-02-21 DIAGNOSIS — D649 Anemia, unspecified: Secondary | ICD-10-CM | POA: Diagnosis not present

## 2019-03-02 DIAGNOSIS — R69 Illness, unspecified: Secondary | ICD-10-CM | POA: Diagnosis not present

## 2019-03-02 IMAGING — MG DIGITAL SCREENING BILATERAL MAMMOGRAM WITH TOMO AND CAD
8 series · 8 of 24 positions shown · non-contrast
Comparison: Previous exam(s).

CLINICAL DATA: Screening.

EXAM:
DIGITAL SCREENING BILATERAL MAMMOGRAM WITH TOMO AND CAD

[R MLO synth-2D]
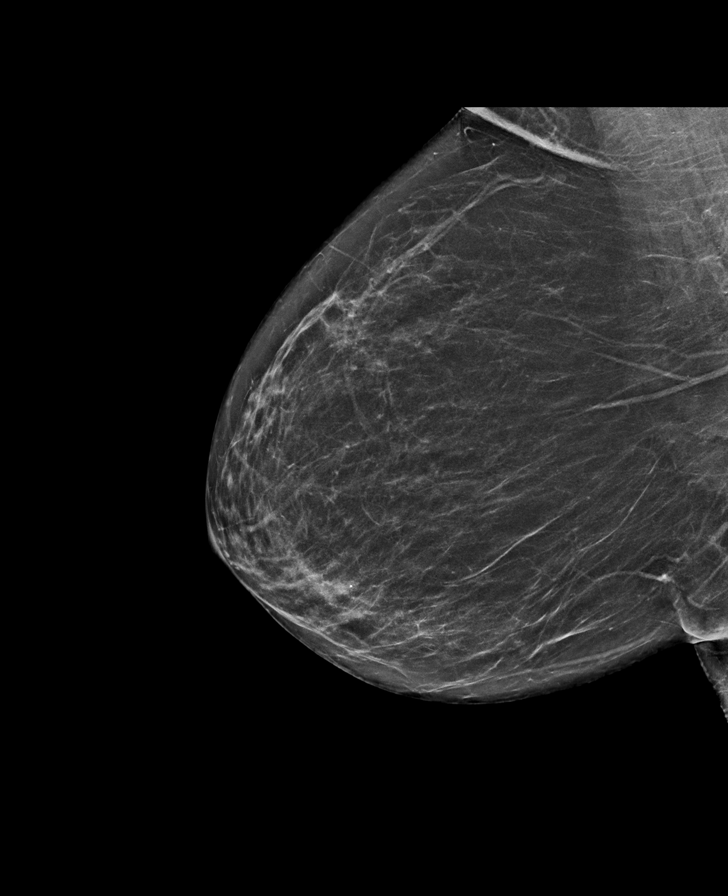

[R CC synth-2D]
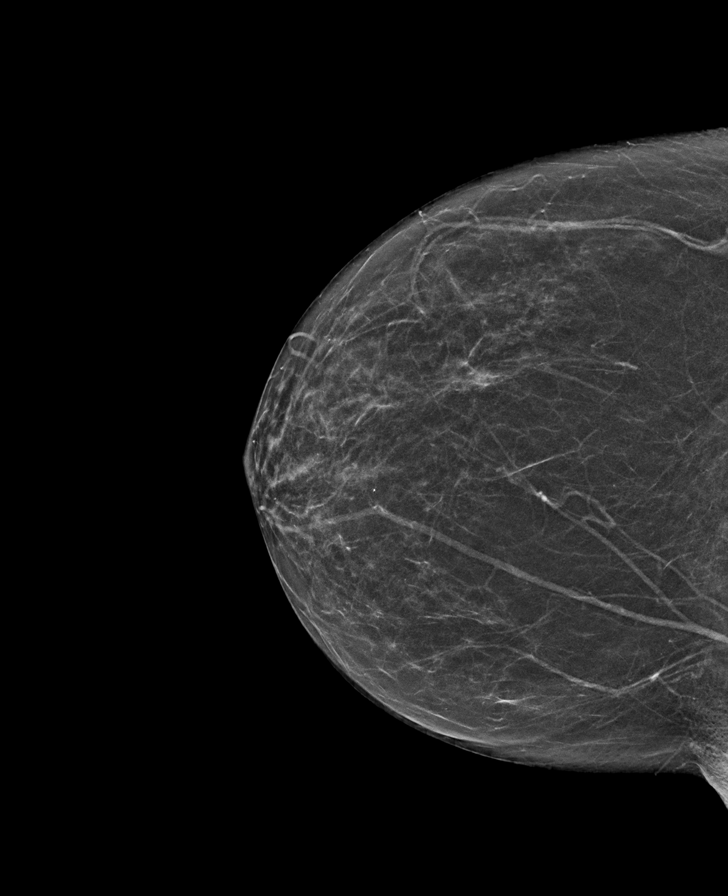

[L CC synth-2D]
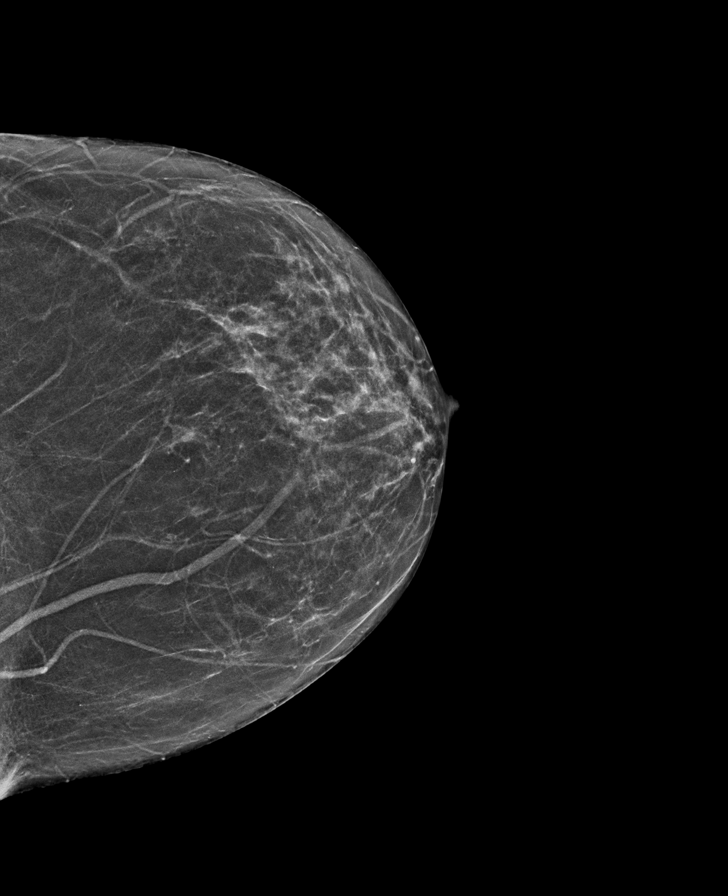

[L MLO synth-2D]
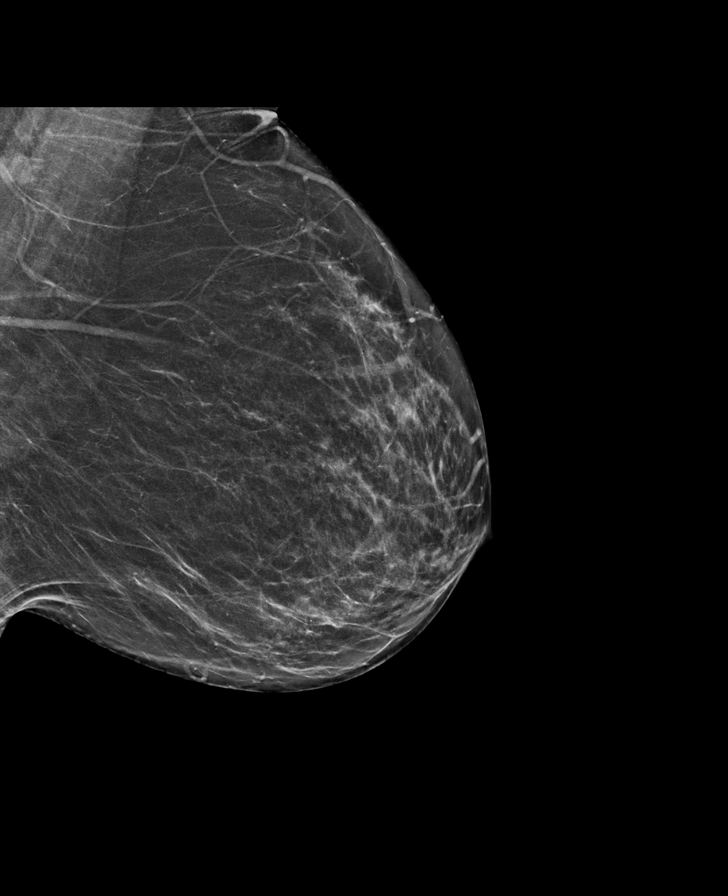

[R CC tomo · tomo slice 29/57.0]
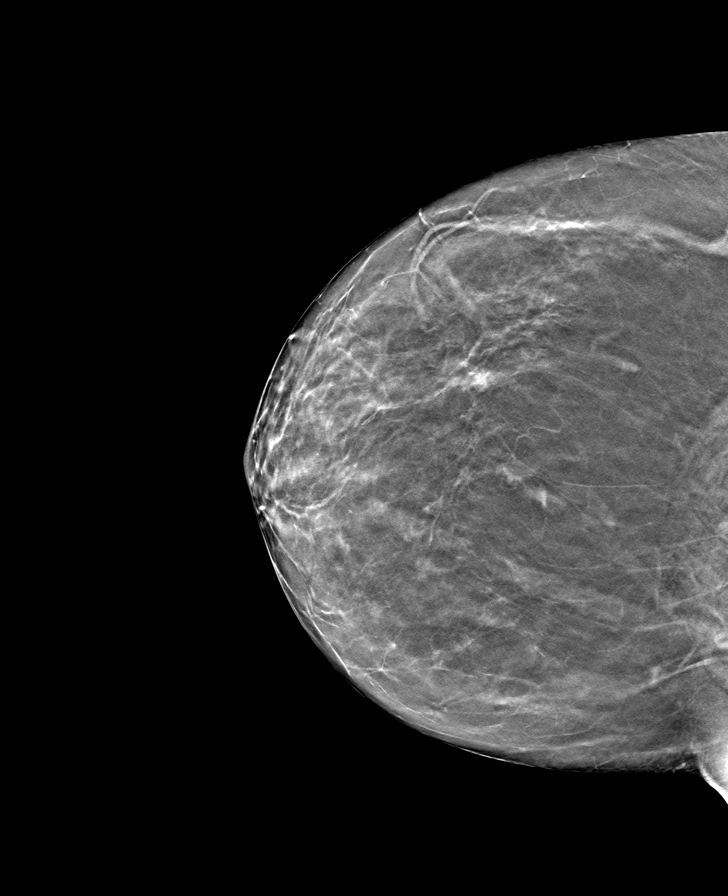

[R MLO tomo · tomo slice 33/66.0]
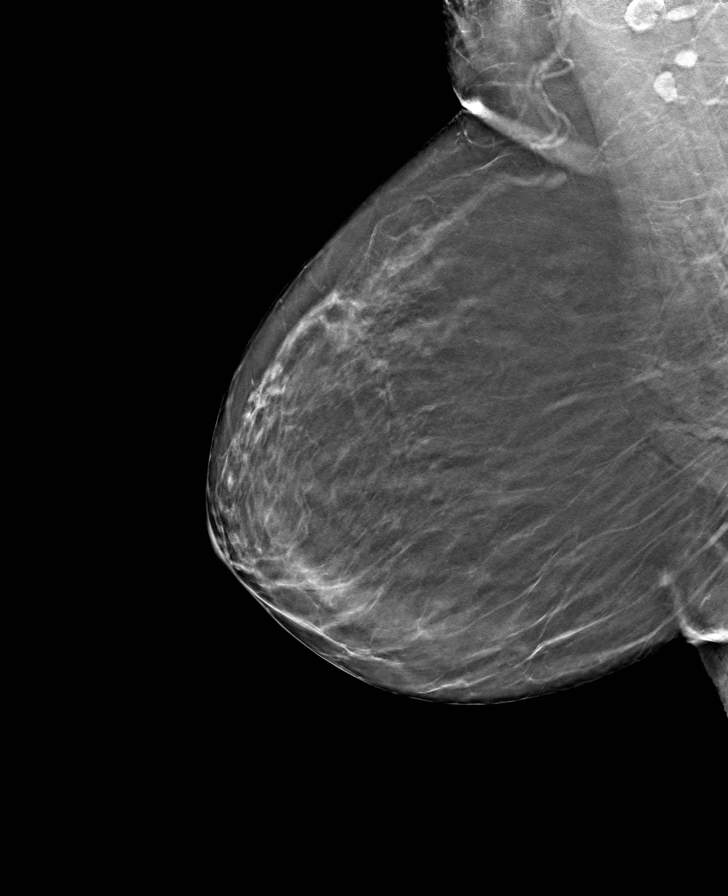

[L CC tomo · tomo slice 27/53.0]
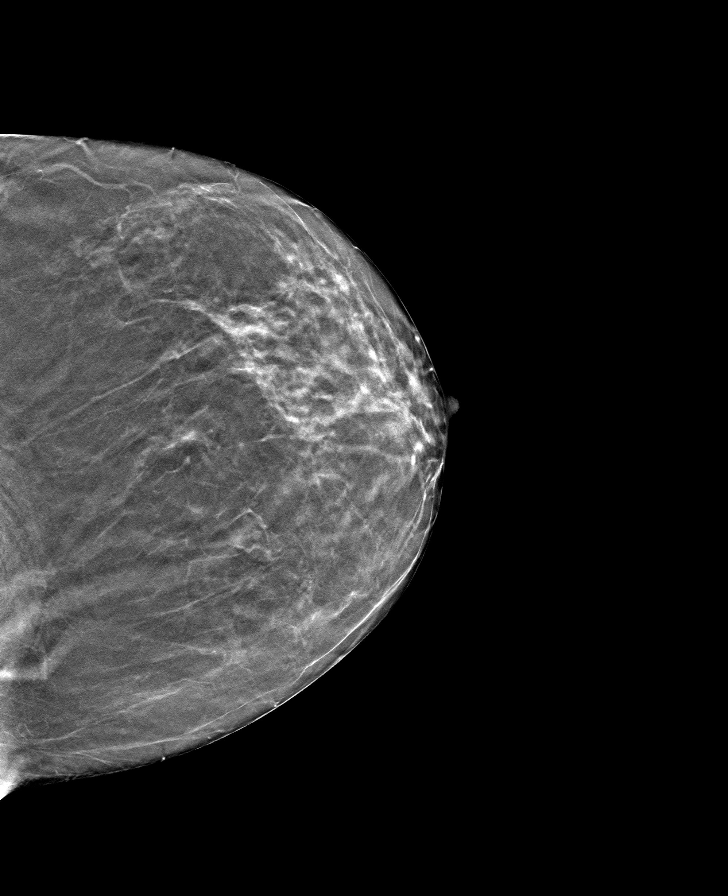

[L MLO tomo · tomo slice 33/64.0]
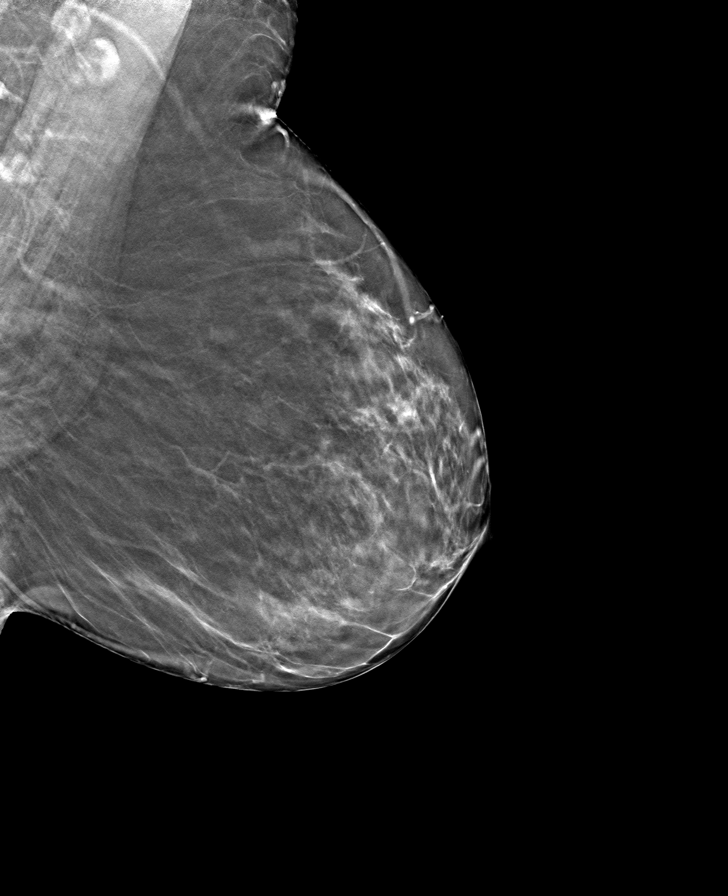

[8 of 24 positions shown; findings below may reference images not displayed]

ACR Breast Density Category b: There are scattered areas of
fibroglandular density.
FINDINGS: There are no findings suspicious for malignancy. Images were
processed with CAD.
IMPRESSION: No mammographic evidence of malignancy. A result letter of this
screening mammogram will be mailed directly to the patient.

RECOMMENDATION:
Screening mammogram in one year. (Code:[id number])

BI-RADS CATEGORY  1: Negative.

## 2019-03-17 DIAGNOSIS — M25561 Pain in right knee: Secondary | ICD-10-CM | POA: Diagnosis not present

## 2019-03-17 DIAGNOSIS — M25562 Pain in left knee: Secondary | ICD-10-CM | POA: Diagnosis not present

## 2019-03-17 DIAGNOSIS — M170 Bilateral primary osteoarthritis of knee: Secondary | ICD-10-CM | POA: Diagnosis not present

## 2019-03-17 DIAGNOSIS — M1712 Unilateral primary osteoarthritis, left knee: Secondary | ICD-10-CM | POA: Diagnosis not present

## 2019-03-23 ENCOUNTER — Other Ambulatory Visit: Payer: Self-pay | Admitting: Family Medicine

## 2019-03-23 DIAGNOSIS — Z1159 Encounter for screening for other viral diseases: Secondary | ICD-10-CM | POA: Diagnosis not present

## 2019-03-23 DIAGNOSIS — E538 Deficiency of other specified B group vitamins: Secondary | ICD-10-CM | POA: Diagnosis not present

## 2019-03-23 DIAGNOSIS — I480 Paroxysmal atrial fibrillation: Secondary | ICD-10-CM | POA: Diagnosis not present

## 2019-03-23 DIAGNOSIS — D649 Anemia, unspecified: Secondary | ICD-10-CM | POA: Diagnosis not present

## 2019-03-23 DIAGNOSIS — Z0000 Encounter for general adult medical examination without abnormal findings: Secondary | ICD-10-CM | POA: Diagnosis not present

## 2019-03-23 DIAGNOSIS — I10 Essential (primary) hypertension: Secondary | ICD-10-CM | POA: Diagnosis not present

## 2019-03-23 DIAGNOSIS — R8290 Unspecified abnormal findings in urine: Secondary | ICD-10-CM | POA: Diagnosis not present

## 2019-03-23 DIAGNOSIS — Z23 Encounter for immunization: Secondary | ICD-10-CM | POA: Diagnosis not present

## 2019-03-23 DIAGNOSIS — E782 Mixed hyperlipidemia: Secondary | ICD-10-CM | POA: Diagnosis not present

## 2019-03-23 DIAGNOSIS — Z8719 Personal history of other diseases of the digestive system: Secondary | ICD-10-CM | POA: Diagnosis not present

## 2019-03-23 DIAGNOSIS — Z8669 Personal history of other diseases of the nervous system and sense organs: Secondary | ICD-10-CM | POA: Diagnosis not present

## 2019-03-23 DIAGNOSIS — Z1231 Encounter for screening mammogram for malignant neoplasm of breast: Secondary | ICD-10-CM

## 2019-04-15 DIAGNOSIS — M25511 Pain in right shoulder: Secondary | ICD-10-CM | POA: Diagnosis not present

## 2019-04-15 DIAGNOSIS — S46811A Strain of other muscles, fascia and tendons at shoulder and upper arm level, right arm, initial encounter: Secondary | ICD-10-CM | POA: Diagnosis not present

## 2019-04-28 DIAGNOSIS — M25511 Pain in right shoulder: Secondary | ICD-10-CM | POA: Diagnosis not present

## 2019-04-28 DIAGNOSIS — Z23 Encounter for immunization: Secondary | ICD-10-CM | POA: Diagnosis not present

## 2019-04-29 ENCOUNTER — Other Ambulatory Visit: Payer: Self-pay

## 2019-04-29 ENCOUNTER — Encounter (HOSPITAL_BASED_OUTPATIENT_CLINIC_OR_DEPARTMENT_OTHER): Payer: Self-pay | Admitting: *Deleted

## 2019-04-29 ENCOUNTER — Emergency Department (HOSPITAL_BASED_OUTPATIENT_CLINIC_OR_DEPARTMENT_OTHER)
Admission: EM | Admit: 2019-04-29 | Discharge: 2019-04-29 | Disposition: A | Discharge status: Home / Self Care | Payer: Medicare HMO | Attending: Emergency Medicine | Admitting: Emergency Medicine

## 2019-04-29 DIAGNOSIS — I10 Essential (primary) hypertension: Secondary | ICD-10-CM | POA: Insufficient documentation

## 2019-04-29 DIAGNOSIS — Y939 Activity, unspecified: Secondary | ICD-10-CM | POA: Diagnosis not present

## 2019-04-29 DIAGNOSIS — S81812A Laceration without foreign body, left lower leg, initial encounter: Secondary | ICD-10-CM | POA: Diagnosis not present

## 2019-04-29 DIAGNOSIS — Y929 Unspecified place or not applicable: Secondary | ICD-10-CM | POA: Insufficient documentation

## 2019-04-29 DIAGNOSIS — Y999 Unspecified external cause status: Secondary | ICD-10-CM | POA: Insufficient documentation

## 2019-04-29 DIAGNOSIS — W228XXA Striking against or struck by other objects, initial encounter: Secondary | ICD-10-CM | POA: Insufficient documentation

## 2019-04-29 DIAGNOSIS — Z79899 Other long term (current) drug therapy: Secondary | ICD-10-CM | POA: Diagnosis not present

## 2019-04-29 DIAGNOSIS — Z91040 Latex allergy status: Secondary | ICD-10-CM | POA: Diagnosis not present

## 2019-04-29 MED ORDER — LIDOCAINE HCL 2 % IJ SOLN
20.0000 mL | Freq: Once | INTRAMUSCULAR | Status: DC
  Filled 2019-04-29: qty 20

## 2019-04-29 NOTE — ED Triage Notes (Signed)
Pt reports bumping her left outer shin on a hard plastic trash can, bandaids in place at triage. Denies fall or head injury

## 2019-04-29 NOTE — ED Provider Notes (Signed)
[facility] EMERGENCY DEPARTMENT Provider Note   CSN: [id number] Arrival date & time: 04/29/19  1131     History   Chief Complaint Chief Complaint  Patient presents with  . Leg Injury    HPI Jenna [name] is a 63 y.o. female.     HPI Patient presents to the emergency department with a lower leg injury that occurred at home.  The patient states she bumped against a plastic trash can and it caused a laceration to her lower leg.  Patient states that she has no other injuries.  Patient states that she applied direct pressure to control the bleeding.  She states she did not take any medications prior to arrival for her symptoms. Past Medical History:  Diagnosis Date  . Allergic rhinitis   . Atrial fibrillation (HCC) 08/22/2013  . Colon polyp 2008   on last colonoscopy with Dr. Loreta Ave in 2008  . Deviated nasal septum   . Diverticulosis 2008   on last colonoscopy with Dr. Loreta Ave in 2008  . Gastric ulcer with hemorrhage   . GERD (gastroesophageal reflux disease)   . Hypercholesteremia   . Hypertension 07/29/2018  . Iron deficiency anemia    After ulcer in 2013, resolved  . Osteoarthritis   . Vitamin B12 deficiency     Patient Active Problem List   Diagnosis Date Noted  . Hypertension 07/29/2018  . 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 Atrial fibrillation (HCC) 08/22/2013  . Gastric ulcer with hemorrhage 08/22/2013  . Hypercholesteremia   . Vitamin B12 deficiency   . Microcytic anemia 02/26/2012  . Hyperlipidemia 02/26/2012  . Palpitations 02/26/2012    Past Surgical History:  Procedure Laterality Date  . CESAREAN SECTION    . ESOPHAGOGASTRODUODENOSCOPY N/A 08/10/2015   Procedure: ESOPHAGOGASTRODUODENOSCOPY (EGD);  Surgeon: Jeani Hawking, MD;  Location[facility] ENDOSCOPY;  Service: Endoscopy;  Laterality: N/A;  . HIATAL HERNIA REPAIR  08/15/2015  . LAPAROSCOPIC NISSEN FUNDOPLICATION  08/15/2015  . LAPAROSCOPIC NISSEN  FUNDOPLICATION N/A 08/15/2015   Procedure: LAPAROSCOPIC HIATAL HERNIA REPAIR AND NISSEN FUNDOPLICATION;  Surgeon: Axel Filler, MD;  Location: [facility] OR;  Service: General;  Laterality: N/A;  . NASAL SINUS SURGERY    . TONSILLECTOMY       OB History    Gravida  3   Para  2   Term  2   Preterm      AB      Living  2     SAB      TAB      Ectopic      Multiple      Live Births               Home Medications    Prior to Admission medications   Medication Sig Start Date End Date Taking? Authorizing Provider  Evolocumab (REPATHA SURECLICK) 140 MG/ML SOAJ Inject 1 pen into the skin every 14 (fourteen) days. 11/03/18   Corky Crafts, MD  ezetimibe (ZETIA) 10 MG tablet Take 1 tablet (10 mg total) by mouth daily. 10/26/18 01/24/19  Corky Crafts, MD  fexofenadine-pseudoephedrine (ALLEGRA-D) 60-120 MG per tablet Take 1 tablet by mouth daily as needed (for allergies). Patient is using this medication for allergies.    [provider]  fluticasone (FLONASE) 50 MCG/ACT nasal spray Place 2 sprays into the nose daily as needed for allergies.     [provider]  lisinopril (PRINIVIL,ZESTRIL) 10 MG tablet Take 1 tablet (  10 mg total) by mouth daily. 09/30/18 09/30/19  Corky CraftsVaranasi, Jayadeep S, MD    Family History Family History  Problem Relation Age of Onset  . Hypertension Mother   . Hepatitis Mother   . Heart attack Father   . Hydrocephalus Father   . Breast cancer Maternal Aunt [id number]    Social History Social History   Tobacco Use  . Smoking status: Never Smoker  . Smokeless tobacco: Never Used  Substance Use Topics  . Alcohol use: Yes    Comment: weekly  . Drug use: No     Allergies   Bactrim [sulfamethoxazole-trimethoprim], Adhesive [tape], and Latex   Review of Systems Review of Systems All other systems negative except as documented in the HPI. All pertinent positives and negatives as reviewed in the HPI.  Physical Exam Updated  Vital Signs BP (!) 157/99 (BP Location: Right Arm)   Pulse 82   Temp 98.7 F (37.1 C) (Oral)   Resp 14   Ht 5\' 3"  (1.6 m)   Wt 68 kg   SpO2 99%   BMI 26.57 kg/m   Physical Exam Vitals signs and nursing note reviewed.  Constitutional:      General: She is not in acute distress.    Appearance: She is well-developed.  HENT:     Head: Normocephalic and atraumatic.  Eyes:     Pupils: Pupils are equal, round, and reactive to light.  Pulmonary:     Effort: Pulmonary effort is normal.  Musculoskeletal:       Legs:  Skin:    General: Skin is warm and dry.  Neurological:     Mental Status: She is alert and oriented to person, place, and time.      ED Treatments / Results  Labs (all labs ordered are listed, but only abnormal results are displayed) Labs Reviewed - No data to display  EKG None  Radiology No results found.  Procedures Procedures (including critical care time)  Medications Ordered in ED Medications  lidocaine (XYLOCAINE) 2 % (with pres) injection 400 mg (has no administration in time range)     Initial Impression / Assessment and Plan / ED Course  I have reviewed the triage vital signs and the nursing notes.  Pertinent labs & imaging results that were available during my care of the patient were reviewed by me and considered in my medical decision making (see chart for details).        LACERATION REPAIR Performed by: Carlyle Dollyhristopher W Aadhira Heffernan Authorized by: Jamesetta Orleanshristopher W Magaby Rumberger Consent: Verbal consent obtained. Risks and benefits: risks, benefits and alternatives were discussed Consent given by: patient Patient identity confirmed: provided demographic data Prepped and Draped in normal sterile fashion Wound explored  Laceration Location: Left anterior shin  Laceration Length: 4 cm  No Foreign Bodies seen or palpated  Anesthesia: local infiltration  Local anesthetic: lidocaine 2% without epinephrine  Anesthetic total: 6 ml  Irrigation  method: syringe Amount of cleaning: standard  Skin closure: 4-0 Vicryl and Dermabond  Number of sutures: 6 subcuticular stitches.  Technique: Simple interrupted and Dermabond  Patient tolerance: Patient tolerated the procedure well with no immediate complications.  Patient is explained that the skin on the outer portion of the tissue is very friable and torn and we will Dermabond this down and have her follow-up with her doctor.  Told to return here as needed. Final Clinical Impressions(s) / ED Diagnoses   Final diagnoses:  Laceration of left lower leg, initial encounter  Skin tear  of left lower leg without complication, initial encounter    ED Discharge Orders    None       Dalia Heading, PA-C 05/03/19 1602    Wyvonnia Dusky, MD 05/04/19 1021

## 2019-04-29 NOTE — Discharge Instructions (Addendum)
Do not get the glue wet for 24 hours.  The glue will come off on its own.  Do not scrub over the area or clean over the area.  Follow-up with your doctor.

## 2019-05-03 DIAGNOSIS — S81802A Unspecified open wound, left lower leg, initial encounter: Secondary | ICD-10-CM | POA: Diagnosis not present

## 2019-05-16 ENCOUNTER — Ambulatory Visit
Admission: RE | Admit: 2019-05-16 | Discharge: 2019-05-16 | Disposition: A | Discharge status: Home / Self Care | Payer: Medicare HMO | Source: Ambulatory Visit | Attending: Family Medicine | Admitting: Family Medicine

## 2019-05-16 ENCOUNTER — Other Ambulatory Visit: Payer: Self-pay

## 2019-05-16 DIAGNOSIS — Z1231 Encounter for screening mammogram for malignant neoplasm of breast: Secondary | ICD-10-CM

## 2019-07-01 DIAGNOSIS — M25562 Pain in left knee: Secondary | ICD-10-CM | POA: Diagnosis not present

## 2019-07-01 DIAGNOSIS — M1712 Unilateral primary osteoarthritis, left knee: Secondary | ICD-10-CM | POA: Diagnosis not present

## 2019-07-01 DIAGNOSIS — M1711 Unilateral primary osteoarthritis, right knee: Secondary | ICD-10-CM | POA: Diagnosis not present

## 2019-07-01 DIAGNOSIS — M170 Bilateral primary osteoarthritis of knee: Secondary | ICD-10-CM | POA: Diagnosis not present

## 2019-07-01 DIAGNOSIS — M25561 Pain in right knee: Secondary | ICD-10-CM | POA: Diagnosis not present

## 2019-07-13 DIAGNOSIS — J0100 Acute maxillary sinusitis, unspecified: Secondary | ICD-10-CM | POA: Diagnosis not present

## 2019-08-15 DIAGNOSIS — H524 Presbyopia: Secondary | ICD-10-CM | POA: Diagnosis not present

## 2019-08-15 DIAGNOSIS — Z0100 Encounter for examination of eyes and vision without abnormal findings: Secondary | ICD-10-CM | POA: Diagnosis not present

## 2019-08-19 ENCOUNTER — Ambulatory Visit: Payer: Medicare HMO

## 2019-08-24 ENCOUNTER — Ambulatory Visit: Payer: Medicare HMO

## 2019-08-27 ENCOUNTER — Ambulatory Visit: Payer: Medicare HMO

## 2019-09-06 DIAGNOSIS — R69 Illness, unspecified: Secondary | ICD-10-CM | POA: Diagnosis not present

## 2019-09-14 DIAGNOSIS — R69 Illness, unspecified: Secondary | ICD-10-CM | POA: Diagnosis not present

## 2019-09-16 DIAGNOSIS — R21 Rash and other nonspecific skin eruption: Secondary | ICD-10-CM | POA: Diagnosis not present

## 2019-09-17 ENCOUNTER — Ambulatory Visit: Payer: Medicare HMO

## 2019-09-30 DIAGNOSIS — M545 Low back pain: Secondary | ICD-10-CM | POA: Diagnosis not present

## 2019-09-30 DIAGNOSIS — M25561 Pain in right knee: Secondary | ICD-10-CM | POA: Diagnosis not present

## 2019-09-30 DIAGNOSIS — M25562 Pain in left knee: Secondary | ICD-10-CM | POA: Diagnosis not present

## 2019-09-30 DIAGNOSIS — M170 Bilateral primary osteoarthritis of knee: Secondary | ICD-10-CM | POA: Diagnosis not present

## 2019-10-13 ENCOUNTER — Ambulatory Visit: Payer: Medicare HMO | Attending: Student

## 2019-10-13 ENCOUNTER — Other Ambulatory Visit: Payer: Self-pay

## 2019-10-13 DIAGNOSIS — R2689 Other abnormalities of gait and mobility: Secondary | ICD-10-CM

## 2019-10-13 DIAGNOSIS — G8929 Other chronic pain: Secondary | ICD-10-CM | POA: Insufficient documentation

## 2019-10-13 DIAGNOSIS — M6281 Muscle weakness (generalized): Secondary | ICD-10-CM | POA: Diagnosis not present

## 2019-10-13 DIAGNOSIS — M545 Low back pain, unspecified: Secondary | ICD-10-CM

## 2019-10-13 NOTE — Addendum Note (Signed)
Addended by: Marcelline Mates on: 10/13/2019 05:19 PM   Modules accepted: Orders

## 2019-10-13 NOTE — Therapy (Signed)
[facility] [address] [place], [place], [address] Phone: [phone]   Fax:  [phone]  Physical Therapy Evaluation  Patient Details  Name: Jenna [name] MRN: [id number] Date of Birth: 11-Nov-1955 No data recorded  Encounter Date: 10/13/2019  PT End of Session - 10/13/19 1405    Visit Number  1    Number of Visits  8    Date for PT Re-Evaluation  11/10/19    Authorization Type  [employer]na Medicare    PT Start Time  0915    PT Stop Time  1000    PT Time Calculation (min)  45 min    Activity Tolerance  Patient tolerated treatment well    Behavior During Therapy  [facility] for tasks assessed/performed       Past Medical History:  Diagnosis Date  . Allergic rhinitis   . Atrial fibrillation (HCC) 08/22/2013  . Colon polyp 2008   on last colonoscopy with Dr. Loreta Ave in 2008  . Deviated nasal septum   . Diverticulosis 2008   on last colonoscopy with Dr. Loreta Ave in 2008  . Gastric ulcer with hemorrhage   . GERD (gastroesophageal reflux disease)   . Hypercholesteremia   . Hypertension 07/29/2018  . Iron deficiency anemia    After ulcer in 2013, resolved  . Osteoarthritis   . Vitamin B12 deficiency     Past Surgical History:  Procedure Laterality Date  . CESAREAN SECTION    . ESOPHAGOGASTRODUODENOSCOPY N/A 08/10/2015   Procedure: ESOPHAGOGASTRODUODENOSCOPY (EGD);  Surgeon: Jeani Hawking, MD;  Location: [facility] ENDOSCOPY;  Service: Endoscopy;  Laterality: N/A;  . HIATAL HERNIA REPAIR  08/15/2015  . LAPAROSCOPIC NISSEN FUNDOPLICATION  08/15/2015  . LAPAROSCOPIC NISSEN FUNDOPLICATION N/A 08/15/2015   Procedure: LAPAROSCOPIC HIATAL HERNIA REPAIR AND NISSEN FUNDOPLICATION;  Surgeon: Axel Filler, MD;  Location: [facility] OR;  Service: General;  Laterality: N/A;  . NASAL SINUS SURGERY    . TONSILLECTOMY      There were no vitals filed for this visit.   Subjective Assessment - 10/13/19 0924    Subjective  Pt reports being referred  for "facet arthritis". She feels discomfort in her low back around the waist of her jeans that does not refer. ~3 years ago, she was sitting on a broken kitchen chair that broke, and caused her to fall to the floor on her bottom.    Pertinent History  B knee OA    Limitations  Other (comment);Sitting;Standing;Walking;Lifting   worse in the AM   How long can you sit comfortably?  Mild pain, but can do as long as she wants    How long can you stand comfortably?  A few minutes, but can tlerate a few hours    How long can you walk comfortably?  A couple of hours with mild pain    Diagnostic tests  XR    Patient Stated Goals  prevent it from geting worse, more info on Pilates/yoga for maintenance and getting stronger    Currently in Pain?  Yes    Pain Score  2    pre-medicated with Tylenol and Voltaren   Pain Location  Back    Pain Orientation  Lower;Mid    Pain Descriptors / Indicators  Aching    Pain Type  Chronic pain    Pain Onset  More than a month ago    Pain Frequency  Intermittent    Aggravating Factors   Bending    Pain Relieving Factors  Ice at night, exercises from PT in the past, stretches    Effect of Pain on Daily Activities  Can tolerate, but feels it throughout the day    Multiple Pain Sites  No         [facility] PT Assessment - 10/13/19 0001      Assessment   Medical Diagnosis  Low Back Pain    Next MD Visit  Maybe a few months for CSI    Prior Therapy  ~1 year ago with [facility]      Balance Screen   Has the patient fallen in the past 6 months  No    Has the patient had a decrease in activity level because of a fear of falling?   Yes    Is the patient reluctant to leave their home because of a fear of falling?   No      Prior Function   Level of Independence  Independent    Vocation  Retired    Garment/textile technologist, reading, walking       ROM / Strength   AROM / PROM / Strength  AROM;Strength      AROM   Overall AROM   Deficits    AROM Assessment Site   Hip;Lumbar    Right/Left Hip  Right;Left   WFL   Lumbar Flexion  30    pain in R side   Lumbar Extension  30    Lumbar - Right Side Bend  25   pain in R side   Lumbar - Left Side Bend  25   some pain in R side   Lumbar - Right Rotation  WFL    Lumbar - Left Rotation  WFL   some R-sided pain     Strength   Overall Strength  Deficits    Strength Assessment Site  Hip    Right/Left Hip  Right;Left    Right Hip Flexion  4/5    Right Hip Extension  3/5    Right Hip External Rotation   4-/5    Right Hip Internal Rotation  3+/5    Right Hip ABduction  3+/5    Left Hip Flexion  4-/5    Left Hip Extension  3/5    Left Hip External Rotation  4-/5    Left Hip Internal Rotation  3+/5    Left Hip ABduction  3+/5      Palpation   Spinal mobility  Hypomobility 2/5 to T/S, mild hypomobility to lumbar spine    Palpation comment  No apparent TTP directly to facets, but mild with CPAs and sacral thrust      Special Tests    Special Tests  Lumbar;Sacrolliac Tests    Lumbar Tests  Slump Test;Prone Knee Bend Test    Sacroiliac Tests   Sacral Thrust      Slump test   Findings  Negative      Prone Knee Bend Test   Findings  Positive    Side  Left    Comment  pain in lumbar, only quad tightness with R PKB      Sacral thrust    Findings  Positive    Comments  centralize pressure                Objective measurements completed on examination: See above findings.      [facility] Adult PT Treatment/Exercise - 10/13/19 0001      Exercises   Exercises  Lumbar;Knee/Hip  Knee/Hip Exercises: Stretches   Hip Flexor Stretch  Both;2 reps;30 seconds    Hip Flexor Stretch Limitations  Modified Thomas with strap on ankle to add quad S      Knee/Hip Exercises: Standing   SLS  Pt only able to balance for ~3" BLE with eyes open      Knee/Hip Exercises: Supine   Bridges  AROM;Strengthening;Both;1 set;10 reps    Bridges Limitations  10"      Knee/Hip Exercises: Sidelying   Clams   10 x 10" BLE             PT Education - 10/13/19 1405    Education Details  Pt educated on HEP and POC    Person(s) Educated  Patient    Methods  Explanation;Demonstration    Comprehension  Verbalized understanding;Returned demonstration       PT Short Term Goals - 10/13/19 1412      PT SHORT TERM GOAL #1   Title  Pt will increase hip MMT to at least 4/5 for increased lumbar support.    Baseline  3 to 3+/5    Time  3    Period  Weeks    Status  New    Target Date  11/03/19      PT SHORT TERM GOAL #2   Title  Pt will be independent and compliant with initial HEP.    Time  1    Period  Weeks    Status  New    Target Date  10/20/19      PT SHORT TERM GOAL #3   Title  Pt will sleep through the night without waking up with pain.    Time  3    Period  Weeks    Status  New    Target Date  11/03/19        PT Long Term Goals - 10/13/19 1413      PT LONG TERM GOAL #1   Title  Pt will increase participation in exercise without c/o increased pain.    Time  4    Period  Weeks    Status  New    Target Date  11/10/19      PT LONG TERM GOAL #2   Title  Pt will be independent with advanced HEP and progressions.    Time  4    Period  Weeks    Status  New    Target Date  11/10/19      PT LONG TERM GOAL #3   Title  Pt will demonstrate lumbar ROM WFL with <3/10 pain reported.    Time  4    Period  Weeks    Status  New    Target Date  11/10/19             Plan - 10/13/19 1406    Clinical Impression Statement  Pt is a 64 year old female who has s/s consistent with low back pain and associated coordination/mobility deficits. Pt demonstrates deficits in core strength, hip strength, thoracic/lumbar mobility, posture, and body mechanics. Pt has mild pain with all functional activities and mobility on a daily basis. Educated pt on POC and discussed diagnosis, prognosis and progression of PT with pt verbalizing understanding and consent to treatment. She responded well  to hip strengthening and gentle manual therapy today. She will benefit from a round of skilled physical therapy to decrease pain, educate on body mechanics, and increase pain free strength and mobility.  Personal Factors and Comorbidities  Comorbidity 3+;Time since onset of injury/illness/exacerbation;Past/Current Experience    Examination-Activity Limitations  Bend;Carry;Lift;Stand;Stairs;Squat;Sleep    Examination-Participation Restrictions  Cleaning;Community Activity;Interpersonal Relationship;Shop    Stability/Clinical Decision Making  Stable/Uncomplicated    Clinical Decision Making  Low    Rehab Potential  Good    PT Frequency  2x / week    PT Duration  4 weeks    PT Treatment/Interventions  Spinal Manipulations;Joint Manipulations;Vasopneumatic Device;Passive range of motion;Manual techniques;Dry needling;Neuromuscular re-education;Patient/family education;Balance training;Therapeutic exercise;Therapeutic activities;Functional mobility training;Stair training;Gait training;Iontophoresis 4mg /ml Dexamethasone;ADLs/Self Care Home Management;Moist Heat;Traction;Electrical Stimulation;Cryotherapy    PT Next Visit Plan  Assess initial HEP understanding and compliance, progress strength and mobility    PT Home Exercise Plan  clams, modified thomasstretch, bridging    Consulted and Agree with Plan of Care  Patient       Patient will benefit from skilled therapeutic intervention in order to improve the following deficits and impairments:  Decreased strength, Postural dysfunction, Decreased balance, Hypomobility, Impaired flexibility, Improper body mechanics, Pain, Impaired perceived functional ability, Increased fascial restricitons, Decreased range of motion  Visit Diagnosis: Chronic bilateral low back pain without sciatica  Muscle weakness (generalized)  Other abnormalities of gait and mobility     Problem List Patient Active Problem List   Diagnosis Date Noted  . Hypertension  07/29/2018  . 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 Atrial fibrillation (HCC) 08/22/2013  . Gastric ulcer with hemorrhage 08/22/2013  . Hypercholesteremia   . Vitamin B12 deficiency   . Microcytic anemia 02/26/2012  . Hyperlipidemia 02/26/2012  . Palpitations 02/26/2012    04/27/2012, PT, DPT 10/13/2019, 5:13 PM  [facility] [place] [facility] [address] [place], [place], [place] Phone: [phone]   Fax:  [phone]  Name: Jenna [name] MRN: Loletta Specter Date of Birth: Jan 23, 1956

## 2019-10-18 ENCOUNTER — Other Ambulatory Visit: Payer: Self-pay | Admitting: Family Medicine

## 2019-10-18 DIAGNOSIS — E2839 Other primary ovarian failure: Secondary | ICD-10-CM

## 2019-10-27 ENCOUNTER — Encounter: Payer: Self-pay | Admitting: Physical Therapy

## 2019-10-27 ENCOUNTER — Other Ambulatory Visit: Payer: Self-pay

## 2019-10-27 ENCOUNTER — Ambulatory Visit: Payer: Medicare HMO | Attending: Student | Admitting: Physical Therapy

## 2019-10-27 DIAGNOSIS — M545 Low back pain: Secondary | ICD-10-CM | POA: Diagnosis not present

## 2019-10-27 DIAGNOSIS — G8929 Other chronic pain: Secondary | ICD-10-CM

## 2019-10-27 DIAGNOSIS — R2689 Other abnormalities of gait and mobility: Secondary | ICD-10-CM | POA: Diagnosis not present

## 2019-10-27 DIAGNOSIS — M6281 Muscle weakness (generalized): Secondary | ICD-10-CM | POA: Diagnosis not present

## 2019-10-27 NOTE — Patient Instructions (Signed)
Access Code: [email or web address] URL: [email or web address] Date: 10/27/2019 Prepared by: Roderic Scarce  Exercises Prone Transversus Abdominus Contraction - 1 x daily - 10 reps - 5 hold Prone Transversus Abdominus Contraction with Small Hip Extension - 1 x daily - 10 reps - 1-2 hold Prone Hip Extension with Bent Knee - 1 x daily - 10 reps - 1-2 hold Prone Scapular Retraction - 1 x daily - 10 reps - 1-2 hold Prone Shoulder Horizontal Abduction - 1 x daily - 10 reps - 1-2 hold Prone W Scapular Retraction - 1 x daily - 10 reps - 1-2 hold Prone Scapular Retraction Y - 1 x daily - 10 reps - 1-2 hold Access Code: [email or web address] URL: [email or web address] Date: 10/27/2019 Prepared by: Roderic Scarce  Exercises Thoracic Extension Mobilization with Noodle - 1 x daily - 1 reps - 60 hold

## 2019-10-27 NOTE — Therapy (Signed)
[facility] [place] [facility] [address] [place], [place], [address] Phone: [phone]   Fax:  [phone]  Physical Therapy Treatment  Patient Details  Name: Jenna [name] MRN: [id number] Date of Birth: Feb 22, 1956 No data recorded  Encounter Date: 10/27/2019  PT End of Session - 10/27/19 1104    Visit Number  2    Number of Visits  8    Date for PT Re-Evaluation  11/10/19    Authorization Type  Aetna Medicare    PT Start Time  1104    PT Stop Time  1155    PT Time Calculation (min)  51 min    Activity Tolerance  Patient tolerated treatment well    Behavior During Therapy  [facility] for tasks assessed/performed       Past Medical History:  Diagnosis Date  . Allergic rhinitis   . Atrial fibrillation (HCC) 08/22/2013  . Colon polyp 2008   on last colonoscopy with Dr. Loreta Ave in 2008  . Deviated nasal septum   . Diverticulosis 2008   on last colonoscopy with Dr. Loreta Ave in 2008  . Gastric ulcer with hemorrhage   . GERD (gastroesophageal reflux disease)   . Hypercholesteremia   . Hypertension 07/29/2018  . Iron deficiency anemia    After ulcer in 2013, resolved  . Osteoarthritis   . Vitamin B12 deficiency     Past Surgical History:  Procedure Laterality Date  . CESAREAN SECTION    . ESOPHAGOGASTRODUODENOSCOPY N/A 08/10/2015   Procedure: ESOPHAGOGASTRODUODENOSCOPY (EGD);  Surgeon: Jeani Hawking, MD;  Location: [facility] ENDOSCOPY;  Service: Endoscopy;  Laterality: N/A;  . HIATAL HERNIA REPAIR  08/15/2015  . LAPAROSCOPIC NISSEN FUNDOPLICATION  08/15/2015  . LAPAROSCOPIC NISSEN FUNDOPLICATION N/A 08/15/2015   Procedure: LAPAROSCOPIC HIATAL HERNIA REPAIR AND NISSEN FUNDOPLICATION;  Surgeon: Axel Filler, MD;  Location: [facility] OR;  Service: General;  Laterality: N/A;  . NASAL SINUS SURGERY    . TONSILLECTOMY      There were no vitals filed for this visit.  Subjective Assessment - 10/27/19 1105    Subjective  Pt is doing her HEP - one she  wasn't able to do while on vacation - hipo flexion stretch d/t low bed.    Pertinent History  B knee OA    Patient Stated Goals  prevent it from geting worse, more info on Pilates/yoga for maintenance and getting stronger    Currently in Pain?  Yes    Pain Score  2     Pain Location  Back    Pain Orientation  Medial;Lower    Pain Descriptors / Indicators  Aching;Dull    Pain Type  Chronic pain    Aggravating Factors   bending    Pain Relieving Factors  ice         [facility] PT Assessment - 10/27/19 0001      Assessment   Medical Diagnosis  Low Back Pain                   [facility] Adult PT Treatment/Exercise - 10/27/19 0001      Lumbar Exercises: Stretches   Single Knee to Chest Stretch  Left;Right;2 reps;20 seconds    Double Knee to Chest Stretch  20 seconds;3 reps    Hip Flexor Stretch  Left;Right;2 reps   supine and in low kneel   Hip Flexor Stretch Limitations  VC for form    Other Lumbar Stretch Exercise  thoracic mobilization over pool noodle  Lumbar Exercises: Aerobic   Stationary Bike  L0x6'      Lumbar Exercises: Standing   Other Standing Lumbar Exercises  3x5 modified dead lifts, 10# from 8" step, lowered to 4" step after first set,  VC for form      Lumbar Exercises: Prone   Other Prone Lumbar Exercises  10 reps each, pelvic press series per handout with HEP      Lumbar Exercises: Quadruped   Madcat/Old Horse  10 reps    Other Quadruped Lumbar Exercises  childs pose      Manual Therapy   Manual Therapy  Joint mobilization    Joint Mobilization  grade II CPA and bilat UPA mobs in lumbar spine and sacrum, focus on Rt L4               PT Short Term Goals - 10/13/19 1412      PT SHORT TERM GOAL #1   Title  Pt will increase hip MMT to at least 4/5 for increased lumbar support.    Baseline  3 to 3+/5    Time  3    Period  Weeks    Status  New    Target Date  11/03/19      PT SHORT TERM GOAL #2   Title  Pt will be independent and  compliant with initial HEP.    Time  1    Period  Weeks    Status  New    Target Date  10/20/19      PT SHORT TERM GOAL #3   Title  Pt will sleep through the night without waking up with pain.    Time  3    Period  Weeks    Status  New    Target Date  11/03/19        PT Long Term Goals - 10/13/19 1413      PT LONG TERM GOAL #1   Title  Pt will increase participation in exercise without c/o increased pain.    Time  4    Period  Weeks    Status  New    Target Date  11/10/19      PT LONG TERM GOAL #2   Title  Pt will be independent with advanced HEP and progressions.    Time  4    Period  Weeks    Status  New    Target Date  11/10/19      PT LONG TERM GOAL #3   Title  Pt will demonstrate lumbar ROM WFL with <3/10 pain reported.    Time  4    Period  Weeks    Status  New    Target Date  11/10/19            Plan - 10/27/19 1158    Clinical Impression Statement  This is Jenna [name]'s first treatment after her eval.  She has been out of town on vacation and will be going away again. She had a difficult time feeling hip flexion stretches today.  Tolerated initial strengthening with some fatigue and slight increase in low back pain, this reduced after manual work and thoracic stretch/mobilization.    Rehab Potential  Good    PT Frequency  2x / week    PT Duration  4 weeks    PT Treatment/Interventions  Spinal Manipulations;Joint Manipulations;Vasopneumatic Device;Passive range of motion;Manual techniques;Dry needling;Neuromuscular re-education;Patient/family education;Balance training;Therapeutic exercise;Therapeutic activities;Functional mobility training;Stair training;Gait training;Iontophoresis 4mg /ml Dexamethasone;ADLs/Self Care Home Management;Moist Heat;Traction;Electrical  Stimulation;Cryotherapy    PT Next Visit Plan  assess response to adding in strengthening and progress as able to functional movements    Consulted and Agree with Plan of Care  Patient       Patient  will benefit from skilled therapeutic intervention in order to improve the following deficits and impairments:  Decreased strength, Postural dysfunction, Decreased balance, Hypomobility, Impaired flexibility, Improper body mechanics, Pain, Impaired perceived functional ability, Increased fascial restricitons, Decreased range of motion  Visit Diagnosis: Chronic bilateral low back pain without sciatica  Muscle weakness (generalized)  Other abnormalities of gait and mobility     Problem List Patient Active Problem List   Diagnosis Date Noted  . Hypertension 07/29/2018  . 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 Atrial fibrillation (HCC) 08/22/2013  . Gastric ulcer with hemorrhage 08/22/2013  . Hypercholesteremia   . Vitamin B12 deficiency   . Microcytic anemia 02/26/2012  . Hyperlipidemia 02/26/2012  . Palpitations 02/26/2012    Roderic Scarce PT  10/27/2019, 12:00 PM  [facility] [place] [facility] [address] [place], [place], [address] Phone: [phone]   Fax:  [phone]  Name: Jenna [name] MRN: [id number] Date of Birth: [birth year]

## 2019-10-31 ENCOUNTER — Encounter: Payer: Self-pay | Admitting: Physical Therapy

## 2019-10-31 ENCOUNTER — Other Ambulatory Visit: Payer: Self-pay

## 2019-10-31 ENCOUNTER — Ambulatory Visit: Payer: Medicare HMO | Admitting: Physical Therapy

## 2019-10-31 DIAGNOSIS — M545 Low back pain, unspecified: Secondary | ICD-10-CM

## 2019-10-31 DIAGNOSIS — R2689 Other abnormalities of gait and mobility: Secondary | ICD-10-CM

## 2019-10-31 DIAGNOSIS — G8929 Other chronic pain: Secondary | ICD-10-CM

## 2019-10-31 DIAGNOSIS — M6281 Muscle weakness (generalized): Secondary | ICD-10-CM

## 2019-10-31 NOTE — Therapy (Signed)
[facility] [address] [place], [place], [address] Phone: [phone]   Fax:  [phone]  Physical Therapy Treatment  Patient Details  Name: Jenna [name] MRN: [id number] Date of Birth: Oct 22, 1955 No data recorded  Encounter Date: 10/31/2019  PT End of Session - 10/31/19 1104    Visit Number  3    Number of Visits  8    Date for PT Re-Evaluation  11/10/19    Authorization Type  [employer]na Medicare    PT Start Time  1104    PT Stop Time  1142    PT Time Calculation (min)  38 min    Behavior During Therapy  [facility] for tasks assessed/performed       Past Medical History:  Diagnosis Date  . Allergic rhinitis   . Atrial fibrillation (HCC) 08/22/2013  . Colon polyp 2008   on last colonoscopy with Dr. Loreta Ave in 2008  . Deviated nasal septum   . Diverticulosis 2008   on last colonoscopy with Dr. Loreta Ave in 2008  . Gastric ulcer with hemorrhage   . GERD (gastroesophageal reflux disease)   . Hypercholesteremia   . Hypertension 07/29/2018  . Iron deficiency anemia    After ulcer in 2013, resolved  . Osteoarthritis   . Vitamin B12 deficiency     Past Surgical History:  Procedure Laterality Date  . CESAREAN SECTION    . ESOPHAGOGASTRODUODENOSCOPY N/A 08/10/2015   Procedure: ESOPHAGOGASTRODUODENOSCOPY (EGD);  Surgeon: Jeani Hawking, MD;  Location: [facility] ENDOSCOPY;  Service: Endoscopy;  Laterality: N/A;  . HIATAL HERNIA REPAIR  08/15/2015  . LAPAROSCOPIC NISSEN FUNDOPLICATION  08/15/2015  . LAPAROSCOPIC NISSEN FUNDOPLICATION N/A 08/15/2015   Procedure: LAPAROSCOPIC HIATAL HERNIA REPAIR AND NISSEN FUNDOPLICATION;  Surgeon: Axel Filler, MD;  Location: [facility] OR;  Service: General;  Laterality: N/A;  . NASAL SINUS SURGERY    . TONSILLECTOMY      There were no vitals filed for this visit.  Subjective Assessment - 10/31/19 1106    Subjective  Pt report she was sore the day of her last tx and then back to her baseline    Pertinent History  B knee OA    Patient Stated Goals  prevent it from geting worse, more info on Pilates/yoga for maintenance and getting stronger    Currently in Pain?  Yes    Pain Score  2     Pain Location  Back    Pain Orientation  Lower;Medial    Pain Type  Chronic pain    Pain Onset  More than a month ago    Pain Frequency  Intermittent    Aggravating Factors   bending FWD                       [facility] Adult PT Treatment/Exercise - 10/31/19 0001      Lumbar Exercises: Stretches   Double Knee to Chest Stretch  20 seconds    Piriformis Stretch  30 seconds   figure 4 each side    Figure 4 Stretch  With overpressure;Supine   at knee, thigh and then into grion      Lumbar Exercises: Aerobic   Nustep  L5x5', some Lt knee pain - needed VC for alignment, this settled the pain down some.       Lumbar Exercises: Standing   Row  Both;15 reps;Theraband    Theraband Level (Row)  Level 3 (Green)    Shoulder Extension  Strengthening;Both;15 reps;Theraband   VC for form   Theraband Level (Shoulder Extension)  Level 3 (Green)    Other Standing Lumbar Exercises  15 reps upper body rotations with green band. each side      Lumbar Exercises: Supine   Bridge  Non-compliant;15 reps;3 seconds    Basic Lumbar Stabilization  --   feet on ball, rolling in/out and side to side     Lumbar Exercises: Sidelying   Other Sidelying Lumbar Exercises  10 reps each exercise and each side pilates FWD/BWD kicks, small and large CW/CCW circles, FWD/BWD toe taps      Knee/Hip Exercises: Stretches   ITB Stretch  2 reps;30 seconds;Both   cross body with strap              PT Short Term Goals - 10/31/19 1140      PT SHORT TERM GOAL #1   Title  Pt will increase hip MMT to at least 4/5 for increased lumbar support.    Status  On-going      PT SHORT TERM GOAL #2   Title  Pt will be independent and compliant with initial HEP.    Status  Achieved      PT SHORT TERM GOAL #3    Title  Pt will sleep through the night without waking up with pain.    Status  On-going        PT Long Term Goals - 10/31/19 1140      PT LONG TERM GOAL #1   Title  Pt will increase participation in exercise without c/o increased pain.    Status  On-going      PT LONG TERM GOAL #2   Title  Pt will be independent with advanced HEP and progressions.    Status  On-going      PT LONG TERM GOAL #3   Title  Pt will demonstrate lumbar ROM WFL with <3/10 pain reported.    Status  On-going            Plan - 10/31/19 1140    Clinical Impression Statement  Jenna [name] is tolerating her HEP well, had some low back soreness after her last visit however it settled down to baseline after tx.  Making progress to her goals. Is hips arevery weak as well as her core and needs to improve these areas    Rehab Potential  Good    PT Frequency  2x / week    PT Duration  4 weeks    PT Treatment/Interventions  Spinal Manipulations;Joint Manipulations;Vasopneumatic Device;Passive range of motion;Manual techniques;Dry needling;Neuromuscular re-education;Patient/family education;Balance training;Therapeutic exercise;Therapeutic activities;Functional mobility training;Stair training;Gait training;Iontophoresis 4mg /ml Dexamethasone;ADLs/Self Care Home Management;Moist Heat;Traction;Electrical Stimulation;Cryotherapy    PT Next Visit Plan  possible mobs and STW to Rt SIJ, gluts and lumbar spine is no improvement       Patient will benefit from skilled therapeutic intervention in order to improve the following deficits and impairments:  Decreased strength, Postural dysfunction, Decreased balance, Hypomobility, Impaired flexibility, Improper body mechanics, Pain, Impaired perceived functional ability, Increased fascial restricitons, Decreased range of motion  Visit Diagnosis: No diagnosis found.     Problem List Patient Active Problem List   Diagnosis Date Noted  . Hypertension 07/29/2018  . Gastric out let  obstruction 08/10/2015  . 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 Atrial fibrillation (HCC) 08/22/2013  . Gastric ulcer with hemorrhage 08/22/2013  . Hypercholesteremia   . Vitamin B12 deficiency   . Microcytic anemia 02/26/2012  . Hyperlipidemia  02/26/2012  . Palpitations 02/26/2012    Jeral Pinch PT  10/31/2019, 11:46 AM  [place], [place], [address] Phone: [phone]   Fax:  [phone]  Name: Jenna [name] MRN: [id number] Date of Birth: September 28, 1955

## 2019-11-02 ENCOUNTER — Other Ambulatory Visit: Payer: Self-pay

## 2019-11-02 ENCOUNTER — Ambulatory Visit: Payer: Medicare HMO | Admitting: Physical Therapy

## 2019-11-02 DIAGNOSIS — R2689 Other abnormalities of gait and mobility: Secondary | ICD-10-CM | POA: Diagnosis not present

## 2019-11-02 DIAGNOSIS — G8929 Other chronic pain: Secondary | ICD-10-CM

## 2019-11-02 DIAGNOSIS — M6281 Muscle weakness (generalized): Secondary | ICD-10-CM | POA: Diagnosis not present

## 2019-11-02 DIAGNOSIS — M545 Low back pain: Secondary | ICD-10-CM | POA: Diagnosis not present

## 2019-11-02 NOTE — Therapy (Signed)
[facility] [address] [place], [place], [address] Phone: [phone]   Fax:  [phone]  Physical Therapy Treatment  Patient Details  Name: Jenna [name] MRN: [id number] Date of Birth: 01-21-1956 No data recorded  Encounter Date: 11/02/2019  PT End of Session - 11/02/19 1151    Visit Number  4    Number of Visits  8    Date for PT Re-Evaluation  11/10/19    Authorization Type  Aetna Medicare    PT Start Time  1109    PT Stop Time  1152    PT Time Calculation (min)  43 min    Activity Tolerance  Patient tolerated treatment well    Behavior During Therapy  [facility] for tasks assessed/performed       Past Medical History:  Diagnosis Date  . Allergic rhinitis   . Atrial fibrillation (HCC) 08/22/2013  . Colon polyp 2008   on last colonoscopy with Dr. Loreta Ave in 2008  . Deviated nasal septum   . Diverticulosis 2008   on last colonoscopy with Dr. Loreta Ave in 2008  . Gastric ulcer with hemorrhage   . GERD (gastroesophageal reflux disease)   . Hypercholesteremia   . Hypertension 07/29/2018  . Iron deficiency anemia    After ulcer in 2013, resolved  . Osteoarthritis   . Vitamin B12 deficiency     Past Surgical History:  Procedure Laterality Date  . CESAREAN SECTION    . ESOPHAGOGASTRODUODENOSCOPY N/A 08/10/2015   Procedure: ESOPHAGOGASTRODUODENOSCOPY (EGD);  Surgeon: Jeani Hawking, MD;  Location: [facility] ENDOSCOPY;  Service: Endoscopy;  Laterality: N/A;  . HIATAL HERNIA REPAIR  08/15/2015  . LAPAROSCOPIC NISSEN FUNDOPLICATION  08/15/2015  . LAPAROSCOPIC NISSEN FUNDOPLICATION N/A 08/15/2015   Procedure: LAPAROSCOPIC HIATAL HERNIA REPAIR AND NISSEN FUNDOPLICATION;  Surgeon: Axel Filler, MD;  [facility]: [facility] OR;  Service: General;  Laterality: N/A;  . NASAL SINUS SURGERY    . TONSILLECTOMY      There were no vitals filed for this visit.  Subjective Assessment - 11/02/19 1111    Subjective  Pt having her regular soreness  today.                       [facility] Adult PT Treatment/Exercise - 11/02/19 0001      Lumbar Exercises: Aerobic   Nustep  L4x5' VC for alignment       Lumbar Exercises: Supine   Pelvic Tilt  10 reps;5 seconds    Clam  15 reps   single leg   Clam Limitations  VC to keep pelvis still    Other Supine Lumbar Exercises  pilates table top with leg press      Lumbar Exercises: Prone   Other Prone Lumbar Exercises  frog legs lifts with TA contraction, then bilat hip extension with heel taps 5x5reps      Lumbar Exercises: Quadruped   [place]  10 reps      Modalities   Modalities  Moist Heat;Iontophoresis      Moist Heat Therapy   Number Minutes Moist Heat  12 Minutes    Moist Heat Location  Other (comment)   buttocks     Iontophoresis   Type of Iontophoresis  Dexamethasone    Location  Rt gluts - upper    Dose  1.0cc    Time  patch      Manual Therapy   Manual Therapy  Soft tissue mobilization  Manual therapy comments  skilled palpation and monitoring during DN    Soft tissue mobilization  STM to Rt gluts and Rt SIJ ligament release       Trigger Point Dry Needling - 11/02/19 0001    Consent Given?  Yes    Education Handout Provided  Yes    Muscles Treated Back/Hip  Gluteus medius;Gluteus maximus    Gluteus Medius Response  Palpable increased muscle length;Twitch response elicited   Rt   Gluteus Maximus Response  Twitch response elicited;Palpable increased muscle length   RT          PT Education - 11/02/19 1200    Education Details  ionto and DN    Person(s) Educated  Patient    Methods  Explanation;Handout    Comprehension  Verbalized understanding       PT Short Term Goals - 10/31/19 1140      PT SHORT TERM GOAL #1   Title  Pt will increase hip MMT to at least 4/5 for increased lumbar support.    Status  On-going      PT SHORT TERM GOAL #2   Title  Pt will be independent and compliant with initial HEP.    Status   Achieved      PT SHORT TERM GOAL #3   Title  Pt will sleep through the night without waking up with pain.    Status  On-going        PT Long Term Goals - 10/31/19 1140      PT LONG TERM GOAL #1   Title  Pt will increase participation in exercise without c/o increased pain.    Status  On-going      PT LONG TERM GOAL #2   Title  Pt will be independent with advanced HEP and progressions.    Status  On-going      PT LONG TERM GOAL #3   Title  Pt will demonstrate lumbar ROM WFL with <3/10 pain reported.    Status  On-going            Plan - 11/02/19 1217    Clinical Impression Statement  PT continues to have pain in the Rt gluts and SIJ area.  She tolerated DN and had a good release, also added in ionto patch to help decrease pain and inflammation.  She will be out of town on vacation and will return after she is back in town    Rehab Potential  Good    PT Frequency  2x / week    PT Duration  4 weeks    PT Treatment/Interventions  Spinal Manipulations;Joint Manipulations;Vasopneumatic Device;Passive range of motion;Manual techniques;Dry needling;Neuromuscular re-education;Patient/family education;Balance training;Therapeutic exercise;Therapeutic activities;Functional mobility training;Stair training;Gait training;Iontophoresis 4mg /ml Dexamethasone;ADLs/Self Care Home Management;Moist Heat;Traction;Electrical Stimulation;Cryotherapy    PT Next Visit Plan  assess response to ionto and DN    Consulted and Agree with Plan of Care  Patient       Patient will benefit from skilled therapeutic intervention in order to improve the following deficits and impairments:  Decreased strength, Postural dysfunction, Decreased balance, Hypomobility, Impaired flexibility, Improper body mechanics, Pain, Impaired perceived functional ability, Increased fascial restricitons, Decreased range of motion  Visit Diagnosis: Chronic bilateral low back pain without sciatica  Muscle weakness  (generalized)  Other abnormalities of gait and mobility     Problem List Patient Active Problem List   Diagnosis Date Noted  . Hypertension 07/29/2018  . 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 Atrial fibrillation ([place]) 08/22/2013  . Gastric ulcer with hemorrhage 08/22/2013  . Hypercholesteremia   . Vitamin B12 deficiency   . Microcytic anemia 02/26/2012  . Hyperlipidemia 02/26/2012  . Palpitations 02/26/2012    Jeral Pinch PT  11/02/2019, 12:22 PM  [place], [place], [address] Phone: [phone]   Fax:  [phone]  Name: Jenna [name] MRN: [id number] Date of Birth: Feb 15, 1956

## 2019-11-02 NOTE — Patient Instructions (Signed)
IONTOPHORESIS PATIENT PRECAUTIONS & CONTRAINDICATIONS:  . Redness under one or both electrodes can occur.  This characterized by a uniform redness that usually disappears within 12 hours of treatment. . Small pinhead size blisters may result in response to the drug.  Contact your physician if the problem persists more than 24 hours. . On rare occasions, iontophoresis therapy can result in temporary skin reactions such as rash, inflammation, irritation or burns.  The skin reactions may be the result of individual sensitivity to the ionic solution used, the condition of the skin at the start of treatment, reaction to the materials in the electrodes, allergies or sensitivity to dexamethasone, or a poor connection between the patch and your skin.  Discontinue using iontophoresis if you have any of these reactions and report to your therapist. . Remove the Patch or electrodes if you have any undue sensation of pain or burning during the treatment and report discomfort to your therapist. . Tell your Therapist if you have had known adverse reactions to the application of electrical current. . Remove the patch  after 6 hours. . The Patch can be worn during normal activity, however excessive motion where the electrodes have been placed can cause poor contact between the skin and the electrode or uneven electrical current resulting in greater risk of skin irritation. Marland Kitchen Keep out of the reach of children.   . DO NOT use if you have a cardiac pacemaker or any other electrically sensitive implanted device. . DO NOT use if you have a known sensitivity to dexamethasone. . DO NOT use during Magnetic Resonance Imaging (MRI). . DO NOT use over broken or compromised skin (e.g. sunburn, cuts, or acne) due to the increased risk of skin reaction. . DO NOT SHAVE over the area to be treated:  To establish good contact between the Patch and the skin, excessive hair may be clipped. . DO NOT place the Patch or electrodes  on or over your eyes, directly over your heart, or brain. . DO NOT reuse the Patch or electrodes as this may cause burns to occur.  Trigger Point Dry Needling  . What is Trigger Point Dry Needling (DN)? o DN is a physical therapy technique used to treat muscle pain and dysfunction. Specifically, DN helps deactivate muscle trigger points (muscle knots).  o A thin filiform needle is used to penetrate the skin and stimulate the underlying trigger point. The goal is for a local twitch response (LTR) to occur and for the trigger point to relax. No medication of any kind is injected during the procedure.   . What Does Trigger Point Dry Needling Feel Like?  o The procedure feels different for each individual patient. Some patients report that they do not actually feel the needle enter the skin and overall the process is not painful. Very mild bleeding may occur. However, many patients feel a deep cramping in the muscle in which the needle was inserted. This is the local twitch response.   Marland Kitchen How Will I feel after the treatment? o Soreness is normal, and the onset of soreness may not occur for a few hours. Typically this soreness does not last longer than two days.  o Bruising is uncommon, however; ice can be used to decrease any possible bruising.  o In rare cases feeling tired or nauseous after the treatment is normal. In addition, your symptoms may get worse before they get better, this period will typically not last longer than 24 hours.   . What Can  I do After My Treatment? o Increase your hydration by drinking more water for the next 24 hours. o You may place ice or heat on the areas treated that have become sore, however, do not use heat on inflamed or bruised areas. Heat often brings more relief post needling. o You can continue your regular activities, but vigorous activity is not recommended initially after the treatment for 24 hours. o DN is best combined with other physical therapy such as  strengthening, stretching, and other therapies.

## 2019-11-09 ENCOUNTER — Ambulatory Visit: Payer: Medicare HMO | Admitting: Physical Therapy

## 2019-11-09 ENCOUNTER — Other Ambulatory Visit: Payer: Self-pay

## 2019-11-09 ENCOUNTER — Encounter: Payer: Self-pay | Admitting: Physical Therapy

## 2019-11-09 DIAGNOSIS — M6281 Muscle weakness (generalized): Secondary | ICD-10-CM | POA: Diagnosis not present

## 2019-11-09 DIAGNOSIS — G8929 Other chronic pain: Secondary | ICD-10-CM | POA: Diagnosis not present

## 2019-11-09 DIAGNOSIS — R2689 Other abnormalities of gait and mobility: Secondary | ICD-10-CM

## 2019-11-09 DIAGNOSIS — M545 Low back pain, unspecified: Secondary | ICD-10-CM

## 2019-11-09 NOTE — Therapy (Signed)
[facility] [address] [place], [place], [address] Phone: [phone]   Fax:  [phone]  Physical Therapy Treatment  Patient Details  Name: Jenna [name] MRN: [id number] Date of Birth: 05/23/1956 No data recorded  Encounter Date: 11/09/2019  PT End of Session - 11/09/19 1740    Visit Number  5    Number of Visits  8    Date for PT Re-Evaluation  11/10/19    Authorization Type  Aetna Medicare    PT Start Time  1700    PT Stop Time  1746    PT Time Calculation (min)  46 min    Activity Tolerance  Patient tolerated treatment well    Behavior During Therapy  [facility] for tasks assessed/performed       Past Medical History:  Diagnosis Date  . Allergic rhinitis   . Atrial fibrillation (HCC) 08/22/2013  . Colon polyp 2008   on last colonoscopy with Dr. Loreta Ave in 2008  . Deviated nasal septum   . Diverticulosis 2008   on last colonoscopy with Dr. Loreta Ave in 2008  . Gastric ulcer with hemorrhage   . GERD (gastroesophageal reflux disease)   . Hypercholesteremia   . Hypertension 07/29/2018  . Iron deficiency anemia    After ulcer in 2013, resolved  . Osteoarthritis   . Vitamin B12 deficiency     Past Surgical History:  Procedure Laterality Date  . CESAREAN SECTION    . ESOPHAGOGASTRODUODENOSCOPY N/A 08/10/2015   Procedure: ESOPHAGOGASTRODUODENOSCOPY (EGD);  Surgeon: Jeani Hawking, MD;  [facility] ENDOSCOPY;  Service: Endoscopy;  Laterality: N/A;  . HIATAL HERNIA REPAIR  08/15/2015  . LAPAROSCOPIC NISSEN FUNDOPLICATION  08/15/2015  . LAPAROSCOPIC NISSEN FUNDOPLICATION N/A 08/15/2015   Procedure: LAPAROSCOPIC HIATAL HERNIA REPAIR AND NISSEN FUNDOPLICATION;  Surgeon: Axel Filler, MD;  [facility]: [facility] OR;  Service: General;  Laterality: N/A;  . NASAL SINUS SURGERY    . TONSILLECTOMY      There were no vitals filed for this visit.  Subjective Assessment - 11/09/19 1701    Subjective  Pt reports mild LBP at  beginning of session today. States that dry needling and iontophoresis last session was very helpful.    Patient Stated Goals  prevent it from geting worse, more info on Pilates/yoga for maintenance and getting stronger    Currently in Pain?  Yes    Pain Score  2     Pain Location  Back    Pain Orientation  Lower                       [facility] Adult PT Treatment/Exercise - 11/09/19 0001      Lumbar Exercises: Stretches   Double Knee to Chest Stretch  5 reps    Double Knee to Chest Stretch Limitations  5x2; dktc with eccentric lowering of BLE to engage core    Lower Trunk Rotation  5 reps;10 seconds    Lower Trunk Rotation Limitations  5x2       Lumbar Exercises: Aerobic   Recumbent Bike  x59min      Lumbar Exercises: Standing   Row  Both;15 reps;Theraband    Theraband Level (Row)  Level 3 (Green)    Shoulder Extension  Strengthening;Both;15 reps      Lumbar Exercises: Seated   Other Seated Lumbar Exercises  seated twist with unweighted ball on dyna disc x10 bilat      Moist Heat Therapy  Number Minutes Moist Heat  15 Minutes    Moist Heat Location  Lumbar Spine   R lumbar and glute     Iontophoresis   Type of Iontophoresis  Dexamethasone    Location  Rt gluts - upper    Dose  1.0cc    Time  patch      Manual Therapy   Manual Therapy  Soft tissue mobilization    Soft tissue mobilization  STM to R lumbar paraspinals, R glute, and R piriformis               PT Short Term Goals - 10/31/19 1140      PT SHORT TERM GOAL #1   Title  Pt will increase hip MMT to at least 4/5 for increased lumbar support.    Status  On-going      PT SHORT TERM GOAL #2   Title  Pt will be independent and compliant with initial HEP.    Status  Achieved      PT SHORT TERM GOAL #3   Title  Pt will sleep through the night without waking up with pain.    Status  On-going        PT Long Term Goals - 10/31/19 1140      PT LONG TERM GOAL #1   Title  Pt will  increase participation in exercise without c/o increased pain.    Status  On-going      PT LONG TERM GOAL #2   Title  Pt will be independent with advanced HEP and progressions.    Status  On-going      PT LONG TERM GOAL #3   Title  Pt will demonstrate lumbar ROM WFL with <3/10 pain reported.    Status  On-going            Plan - 11/09/19 1741    Clinical Impression Statement  Pt reports that pain in R glute/SIJ area is much better following last tx. Progressed lumbar stabilization ex's. STM to R glute/piriformis/lumbar area was tolerated well with pt reporting decrease in pain post tx.    PT Treatment/Interventions  Spinal Manipulations;Joint Manipulations;Vasopneumatic Device;Passive range of motion;Manual techniques;Dry needling;Neuromuscular re-education;Patient/family education;Balance training;Therapeutic exercise;Therapeutic activities;Functional mobility training;Stair training;Gait training;Iontophoresis 4mg /ml Dexamethasone;ADLs/Self Care Home Management;Moist Heat;Traction;Electrical Stimulation;Cryotherapy    PT Next Visit Plan  progress lumbar and LE ex's, manual as indicated, ionto if indicated    Consulted and Agree with Plan of Care  Patient       Patient will benefit from skilled therapeutic intervention in order to improve the following deficits and impairments:  Decreased strength, Postural dysfunction, Decreased balance, Hypomobility, Impaired flexibility, Improper body mechanics, Pain, Impaired perceived functional ability, Increased fascial restricitons, Decreased range of motion  Visit Diagnosis: Other abnormalities of gait and mobility  Muscle weakness (generalized)  Chronic bilateral low back pain without sciatica     Problem List Patient Active Problem List   Diagnosis Date Noted  . Hypertension 07/29/2018  . 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 Atrial fibrillation (HCC) 08/22/2013  . Gastric ulcer  with hemorrhage 08/22/2013  . Hypercholesteremia   . Vitamin B12 deficiency   . Microcytic anemia 02/26/2012  . Hyperlipidemia 02/26/2012  . Palpitations 02/26/2012   04/27/2012, PT, DPT Lysle Rubens Aleshia Cartelli 11/09/2019, 5:44 PM  [facility] [place] [address] [place], [place], [place] Phone: [phone]   Fax:  [phone]  Name:  Jenna [name] MRN: [id number] Date of Birth: 1955/08/28

## 2019-11-11 ENCOUNTER — Other Ambulatory Visit: Payer: Self-pay

## 2019-11-11 ENCOUNTER — Ambulatory Visit: Payer: Medicare HMO | Admitting: Physical Therapy

## 2019-11-11 ENCOUNTER — Other Ambulatory Visit (HOSPITAL_BASED_OUTPATIENT_CLINIC_OR_DEPARTMENT_OTHER): Payer: Medicare HMO

## 2019-11-11 ENCOUNTER — Encounter: Payer: Self-pay | Admitting: Physical Therapy

## 2019-11-11 DIAGNOSIS — M545 Low back pain, unspecified: Secondary | ICD-10-CM

## 2019-11-11 DIAGNOSIS — G8929 Other chronic pain: Secondary | ICD-10-CM

## 2019-11-11 DIAGNOSIS — R2689 Other abnormalities of gait and mobility: Secondary | ICD-10-CM

## 2019-11-11 DIAGNOSIS — M6281 Muscle weakness (generalized): Secondary | ICD-10-CM | POA: Diagnosis not present

## 2019-11-11 NOTE — Therapy (Signed)
[facility] [address] [place], [place], [address] Phone: [phone]   Fax:  [phone]  Physical Therapy Treatment  Patient Details  Name: Jenna [name] MRN: [id number] Date of Birth: 05/03/1956 No data recorded  Encounter Date: 11/11/2019  PT End of Session - 11/11/19 1021    Visit Number  6    Number of Visits  8    Date for PT Re-Evaluation  11/10/19    Authorization Type  A[employer]na Medicare    PT Start Time  [phone]    PT Stop Time  1018    PT Time Calculation (min)  45 min    Activity Tolerance  Patient tolerated treatment well    Behavior During Therapy  [facility] for tasks assessed/performed       Past Medical History:  Diagnosis Date  . Allergic rhinitis   . Atrial fibrillation (HCC) 08/22/2013  . Colon polyp 2008   on last colonoscopy with Dr. Loreta Ave in 2008  . Deviated nasal septum   . Diverticulosis 2008   on last colonoscopy with Dr. Loreta Ave in 2008  . Gastric ulcer with hemorrhage   . GERD (gastroesophageal reflux disease)   . Hypercholesteremia   . Hypertension 07/29/2018  . Iron deficiency anemia    After ulcer in 2013, resolved  . Osteoarthritis   . Vitamin B12 deficiency     Past Surgical History:  Procedure Laterality Date  . CESAREAN SECTION    . ESOPHAGOGASTRODUODENOSCOPY N/A 08/10/2015   Procedure: ESOPHAGOGASTRODUODENOSCOPY (EGD);  Surgeon: Jeani Hawking, MD;  Location: [facility] ENDOSCOPY;  Service: Endoscopy;  Laterality: N/A;  . HIATAL HERNIA REPAIR  08/15/2015  . LAPAROSCOPIC NISSEN FUNDOPLICATION  08/15/2015  . LAPAROSCOPIC NISSEN FUNDOPLICATION N/A 08/15/2015   Procedure: LAPAROSCOPIC HIATAL HERNIA REPAIR AND NISSEN FUNDOPLICATION;  Surgeon: Axel Filler, MD;  Location: [facility] OR;  Service: General;  Laterality: N/A;  . NASAL SINUS SURGERY    . TONSILLECTOMY      There were no vitals filed for this visit.  Subjective Assessment - 11/11/19 0939    Subjective  Pt reports mild LBP at  beginning of session; states that she would like DN next week if possible. Pt reports that she believes ionto patch was a little too high last time; needs to be placed more on glute region.    Patient Stated Goals  prevent it from geting worse, more info on Pilates/yoga for maintenance and getting stronger    Currently in Pain?  Yes    Pain Score  2     Pain Location  Back    Pain Orientation  Lower                       [facility] Adult PT Treatment/Exercise - 11/11/19 0001      Exercises   Exercises  Shoulder      Lumbar Exercises: Stretches   Active Hamstring Stretch  Right;Left;30 seconds    Standing Extension  10 reps;10 seconds    Standing Extension Limitations  standing lumbar extension over exercise ball with 10 sec hold; 5x2    Piriformis Stretch  Right;Left;1 rep;30 seconds    Piriformis Stretch Limitations  seated      Lumbar Exercises: Aerobic   Elliptical  R4, I4; 3 min fwd 3 min bkwd      Knee/Hip Exercises: Machines for Strengthening   Total Gym Leg Press  20lb BLE 2x10      Shoulder Exercises: ROM/Strengthening  Lat Pull  15 reps    Lat Pull Limitations  25# 2x15    Cybex Row  15 reps    Cybex Row Limitations  25# 2x15    Ball on Wall  x10 3 sec hold; cues for end range stretch      Iontophoresis   Type of Iontophoresis  Dexamethasone    Location  Rt gluts - upper    Dose  1.0cc    Time  patch      Manual Therapy   Manual Therapy  Soft tissue mobilization    Soft tissue mobilization  STM to R lumbar paraspinals, R glute, and R piriformis               PT Short Term Goals - 10/31/19 1140      PT SHORT TERM GOAL #1   Title  Pt will increase hip MMT to at least 4/5 for increased lumbar support.    Status  On-going      PT SHORT TERM GOAL #2   Title  Pt will be independent and compliant with initial HEP.    Status  Achieved      PT SHORT TERM GOAL #3   Title  Pt will sleep through the night without waking up with pain.     Status  On-going        PT Long Term Goals - 10/31/19 1140      PT LONG TERM GOAL #1   Title  Pt will increase participation in exercise without c/o increased pain.    Status  On-going      PT LONG TERM GOAL #2   Title  Pt will be independent with advanced HEP and progressions.    Status  On-going      PT LONG TERM GOAL #3   Title  Pt will demonstrate lumbar ROM WFL with <3/10 pain reported.    Status  On-going            Plan - 11/11/19 1023    Clinical Impression Statement  Pt tolerated progression of LE and lumbar stabilization ex's well. Pt required cuing throughout to prevent shoulder elevation during exercise. Pt requests DN session next week; reports that this was very helpful. Pt remains tender to palpation in R glute/piriformis. Continue to progress ex's next rx.    PT Treatment/Interventions  Spinal Manipulations;Joint Manipulations;Vasopneumatic Device;Passive range of motion;Manual techniques;Dry needling;Neuromuscular re-education;Patient/family education;Balance training;Therapeutic exercise;Therapeutic activities;Functional mobility training;Stair training;Gait training;Iontophoresis 4mg /ml Dexamethasone;ADLs/Self Care Home Management;Moist Heat;Traction;Electrical Stimulation;Cryotherapy    PT Next Visit Plan  progress lumbar and LE ex's, manual as indicated, ionto and DN if indicated    Consulted and Agree with Plan of Care  Patient       Patient will benefit from skilled therapeutic intervention in order to improve the following deficits and impairments:  Decreased strength, Postural dysfunction, Decreased balance, Hypomobility, Impaired flexibility, Improper body mechanics, Pain, Impaired perceived functional ability, Increased fascial restricitons, Decreased range of motion  Visit Diagnosis: Other abnormalities of gait and mobility  Muscle weakness (generalized)  Chronic bilateral low back pain without sciatica     Problem List Patient Active Problem  List   Diagnosis Date Noted  . Hypertension 07/29/2018  . 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 Atrial fibrillation (HCC) 08/22/2013  . Gastric ulcer with hemorrhage 08/22/2013  . Hypercholesteremia   . Vitamin B12 deficiency   . Microcytic anemia 02/26/2012  . Hyperlipidemia 02/26/2012  . Palpitations  02/26/2012   Amador Cunas, PT, DPT [name] Sharren Schnurr 11/11/2019, 10:26 AM  [place], [place], [address] Phone: [phone]   Fax:  [phone]  Name: Jenna [name] MRN: [id number] Date of Birth: 1955-10-02

## 2019-11-14 ENCOUNTER — Other Ambulatory Visit: Payer: Self-pay

## 2019-11-14 ENCOUNTER — Ambulatory Visit (HOSPITAL_BASED_OUTPATIENT_CLINIC_OR_DEPARTMENT_OTHER)
Admission: RE | Admit: 2019-11-14 | Discharge: 2019-11-14 | Disposition: A | Discharge status: Home / Self Care | Payer: Medicare HMO | Source: Ambulatory Visit | Attending: Family Medicine | Admitting: Family Medicine

## 2019-11-14 DIAGNOSIS — E2839 Other primary ovarian failure: Secondary | ICD-10-CM

## 2019-11-14 DIAGNOSIS — M810 Age-related osteoporosis without current pathological fracture: Secondary | ICD-10-CM | POA: Diagnosis not present

## 2019-11-14 DIAGNOSIS — Z780 Asymptomatic menopausal state: Secondary | ICD-10-CM | POA: Diagnosis not present

## 2019-11-15 ENCOUNTER — Encounter: Payer: Self-pay | Admitting: Physical Therapy

## 2019-11-15 ENCOUNTER — Ambulatory Visit: Payer: Medicare HMO | Admitting: Physical Therapy

## 2019-11-15 DIAGNOSIS — M545 Low back pain: Secondary | ICD-10-CM | POA: Diagnosis not present

## 2019-11-15 DIAGNOSIS — M6281 Muscle weakness (generalized): Secondary | ICD-10-CM

## 2019-11-15 DIAGNOSIS — R2689 Other abnormalities of gait and mobility: Secondary | ICD-10-CM

## 2019-11-15 DIAGNOSIS — G8929 Other chronic pain: Secondary | ICD-10-CM

## 2019-11-15 NOTE — Therapy (Signed)
[place], [place], [address] Phone: [phone]   Fax:  [phone]  Physical Therapy Treatment  Patient Details  Name: Jenna [name] MRN: [id number] Date of Birth: 1956/01/20 No data recorded  Encounter Date: 11/15/2019  PT End of Session - 11/15/19 1145    Visit Number  7    Number of Visits  8    Date for PT Re-Evaluation  11/10/19    Authorization Type  Aetna Medicare    PT Start Time  1100    PT Stop Time  1145    PT Time Calculation (min)  45 min    Activity Tolerance  Patient tolerated treatment well    Behavior During Therapy  [facility] for tasks assessed/performed       Past Medical History:  Diagnosis Date  . Allergic rhinitis   . Atrial fibrillation ([place]) 08/22/2013  . Colon polyp 2008   on last colonoscopy with Dr. Collene Mares in 2008  . Deviated nasal septum   . Diverticulosis 2008   on last colonoscopy with Dr. Collene Mares in 2008  . Gastric ulcer with hemorrhage   . GERD (gastroesophageal reflux disease)   . Hypercholesteremia   . Hypertension 07/29/2018  . Iron deficiency anemia    After ulcer in 2013, resolved  . Osteoarthritis   . Vitamin B12 deficiency     Past Surgical History:  Procedure Laterality Date  . CESAREAN SECTION    . ESOPHAGOGASTRODUODENOSCOPY N/A 08/10/2015   Procedure: ESOPHAGOGASTRODUODENOSCOPY (EGD);  Surgeon: Carol Ada, MD;  Location: [facility] ENDOSCOPY;  Service: Endoscopy;  Laterality: N/A;  . HIATAL HERNIA REPAIR  08/15/2015  . LAPAROSCOPIC NISSEN FUNDOPLICATION  19/37/9024  . LAPAROSCOPIC NISSEN FUNDOPLICATION N/A 0/97/3532   Procedure: LAPAROSCOPIC HIATAL HERNIA REPAIR AND NISSEN FUNDOPLICATION;  Surgeon: Ralene Ok, MD;  Location: [place];  Service: General;  Laterality: N/A;  . NASAL SINUS SURGERY    . TONSILLECTOMY      There were no vitals filed for this visit.  Subjective Assessment - 11/15/19 1106    Subjective  Pt reports mild LBP at  beginning of session; states that she would like to receive DN today.    Currently in Pain?  Yes    Pain Score  2     Pain Location  Back    Pain Orientation  Lower                       [facility] Adult PT Treatment/Exercise - 11/15/19 0001      Lumbar Exercises: Stretches   Passive Hamstring Stretch  Right;Left;1 rep;60 seconds    Double Knee to Chest Stretch  5 reps    Double Knee to Chest Stretch Limitations  5x2; dktc with eccentric lowering of BLE to engage core    Lower Trunk Rotation  5 reps;10 seconds    Lower Trunk Rotation Limitations  5x2       Lumbar Exercises: Aerobic   Elliptical  R4, I4; 2 min fwd 2 min bkwd      Lumbar Exercises: Supine   Bridge  10 reps;3 seconds    Bridge Limitations  cue for PPT    Bridge with Ball Squeeze  10 reps;3 seconds      Knee/Hip Exercises: Machines for Strengthening   Cybex Knee Extension  10# 1x10 BLE    Cybex Knee Flexion  25# 1x10 BLE    Total Gym Leg Press  20lb BLE 2x10  Shoulder Exercises: ROM/Strengthening   Lat Pull  15 reps    Lat Pull Limitations  25# 2x15    Cybex Row  15 reps    Cybex Row Limitations  25# 2x15      Manual Therapy   Manual Therapy  Soft tissue mobilization    Soft tissue mobilization  STM to R lumbar paraspinals, R glute, and R piriformis               PT Short Term Goals - 10/31/19 1140      PT SHORT TERM GOAL #1   Title  Pt will increase hip MMT to at least 4/5 for increased lumbar support.    Status  On-going      PT SHORT TERM GOAL #2   Title  Pt will be independent and compliant with initial HEP.    Status  Achieved      PT SHORT TERM GOAL #3   Title  Pt will sleep through the night without waking up with pain.    Status  On-going        PT Long Term Goals - 10/31/19 1140      PT LONG TERM GOAL #1   Title  Pt will increase participation in exercise without c/o increased pain.    Status  On-going      PT LONG TERM GOAL #2   Title  Pt will be  independent with advanced HEP and progressions.    Status  On-going      PT LONG TERM GOAL #3   Title  Pt will demonstrate lumbar ROM WFL with <3/10 pain reported.    Status  On-going            Plan - 11/15/19 1145    Clinical Impression Statement  Pt continues to report R LBP; pt progressing with LE exercises but shows limited improvement in symptoms overall. Ionto and STM have helped reduce pain short term; DN session today, assess pt response at next rx. Continue to progress lumbar ROM/flexibility.    PT Treatment/Interventions  Spinal Manipulations;Joint Manipulations;Vasopneumatic Device;Passive range of motion;Manual techniques;Dry needling;Neuromuscular re-education;Patient/family education;Balance training;Therapeutic exercise;Therapeutic activities;Functional mobility training;Stair training;Gait training;Iontophoresis 4mg /ml Dexamethasone;ADLs/Self Care Home Management;Moist Heat;Traction;Electrical Stimulation;Cryotherapy    PT Next Visit Plan  progress lumbar and LE ex's, manual as indicated, ionto and DN if indicated    Consulted and Agree with Plan of Care  Patient       Patient will benefit from skilled therapeutic intervention in order to improve the following deficits and impairments:  Decreased strength, Postural dysfunction, Decreased balance, Hypomobility, Impaired flexibility, Improper body mechanics, Pain, Impaired perceived functional ability, Increased fascial restricitons, Decreased range of motion  Visit Diagnosis: Other abnormalities of gait and mobility  Muscle weakness (generalized)  Chronic bilateral low back pain without sciatica     Problem List Patient Active Problem List   Diagnosis Date Noted  . Hypertension 07/29/2018  . 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 Atrial fibrillation (HCC) 08/22/2013  . Gastric ulcer with hemorrhage 08/22/2013  . Hypercholesteremia   . Vitamin B12 deficiency   .  Microcytic anemia 02/26/2012  . Hyperlipidemia 02/26/2012  . Palpitations 02/26/2012   04/27/2012, PT, DPT Lysle Rubens Thelton Graca 11/15/2019, 11:48 AM  [facility] [address] [place], [place], [place] Phone: [phone]   Fax:  [phone]  Name: Jenna [name] MRN: Loletta Specter Date of Birth: August 31, 1955

## 2019-11-18 ENCOUNTER — Encounter: Payer: Medicare HMO | Admitting: Physical Therapy

## 2019-11-22 ENCOUNTER — Ambulatory Visit: Payer: Medicare HMO | Attending: Student | Admitting: Physical Therapy

## 2019-11-22 ENCOUNTER — Encounter: Payer: Self-pay | Admitting: Physical Therapy

## 2019-11-22 ENCOUNTER — Other Ambulatory Visit: Payer: Self-pay

## 2019-11-22 DIAGNOSIS — R2689 Other abnormalities of gait and mobility: Secondary | ICD-10-CM | POA: Insufficient documentation

## 2019-11-22 DIAGNOSIS — M545 Low back pain, unspecified: Secondary | ICD-10-CM

## 2019-11-22 DIAGNOSIS — G8929 Other chronic pain: Secondary | ICD-10-CM | POA: Diagnosis not present

## 2019-11-22 DIAGNOSIS — M6281 Muscle weakness (generalized): Secondary | ICD-10-CM | POA: Diagnosis not present

## 2019-11-22 NOTE — Patient Instructions (Signed)
Access Code: [email or web address] URL: [email or web address] Date: 11/22/2019 Prepared by: Lysle Rubens  Exercises Supine Figure 4 Piriformis Stretch - 1 x daily - 5 x weekly - 3 sets - 2 reps - 30 sec hold Child's Pose Stretch - 1 x daily - 5 x weekly - 3 sets - 2 reps - 30 sec hold Child's Pose with Sidebending - 1 x daily - 5 x weekly - 3 sets - 2 reps - 30 sec hold

## 2019-11-22 NOTE — Therapy (Signed)
[place], [place], [address] Phone: [phone]   Fax:  [phone]  Physical Therapy Treatment  Patient Details  Name: Jenna [name] MRN: [id number] Date of Birth: [birth year] No data recorded  Encounter Date: 11/22/2019  PT End of Session - 11/22/19 1146    Visit Number  8    Date for PT Re-Evaluation  01/22/20    Authorization Type  Aetna Medicare    PT Start Time  1100    PT Stop Time  1145    PT Time Calculation (min)  45 min    Activity Tolerance  Patient tolerated treatment well    Behavior During Therapy  [facility] for tasks assessed/performed       Past Medical History:  Diagnosis Date  . Allergic rhinitis   . Atrial fibrillation ([place]) 08/22/2013  . Colon polyp 2008   on last colonoscopy with Dr. Collene Mares in 2008  . Deviated nasal septum   . Diverticulosis 2008   on last colonoscopy with Dr. Collene Mares in 2008  . Gastric ulcer with hemorrhage   . GERD (gastroesophageal reflux disease)   . Hypercholesteremia   . Hypertension 07/29/2018  . Iron deficiency anemia    After ulcer in 2013, resolved  . Osteoarthritis   . Vitamin B12 deficiency     Past Surgical History:  Procedure Laterality Date  . CESAREAN SECTION    . ESOPHAGOGASTRODUODENOSCOPY N/A 08/10/2015   Procedure: ESOPHAGOGASTRODUODENOSCOPY (EGD);  Surgeon: Carol Ada, MD;  Location: [facility] ENDOSCOPY;  Service: Endoscopy;  Laterality: N/A;  . HIATAL HERNIA REPAIR  08/15/2015  . LAPAROSCOPIC NISSEN FUNDOPLICATION  20/25/4270  . LAPAROSCOPIC NISSEN FUNDOPLICATION N/A 01/11/7627   Procedure: LAPAROSCOPIC HIATAL HERNIA REPAIR AND NISSEN FUNDOPLICATION;  Surgeon: Ralene Ok, MD;  Location: [place];  Service: General;  Laterality: N/A;  . NASAL SINUS SURGERY    . TONSILLECTOMY      There were no vitals filed for this visit.  Subjective Assessment - 11/22/19 1111    Subjective  Pt reports that she is feeling about the same; mild LBP  and no real changes since last rx. States that she is not sure if DN was helpful but is still interested in trying again.    Patient Stated Goals  prevent it from geting worse, more info on Pilates/yoga for maintenance and getting stronger    Currently in Pain?  Yes    Pain Score  2     Pain Location  Back    Pain Orientation  Lower    Pain Descriptors / Indicators  Aching;Dull    Pain Type  Chronic pain    Pain Onset  More than a month ago    Pain Frequency  Intermittent    Aggravating Factors   bending fwd    Pain Relieving Factors  ice         [facility] PT Assessment - 11/22/19 0001      AROM   Lumbar Flexion  50   no pain   Lumbar Extension  30   no pain   Lumbar - Right Side Bend  35    Lumbar - Left Side Bend  35    Lumbar - Right Rotation  WFL    Lumbar - Left Rotation  [facility]      Strength   Overall Strength Comments  B hip/knee strength 4+/5 except B hip ext/abd 4-/5   improved since eval     Palpation  Palpation comment  tender to palpation B glute/piriformis                   [facility] Adult PT Treatment/Exercise - 11/22/19 0001      Lumbar Exercises: Stretches   Quad Stretch  Right;Left;1 rep;60 seconds    Quad Stretch Limitations  prone; passive    Piriformis Stretch  Right;Left;1 rep;30 seconds    Piriformis Stretch Limitations  supine    Other Lumbar Stretch Exercise  child's pose fwd/lat x2 30 sec each      Lumbar Exercises: Aerobic   Elliptical  R4, I4; 3 min fwd 3 min bkwd             PT Education - 11/22/19 1146    Education Details  Pt educated on updated HEP    Person(s) Educated  Patient    Methods  Explanation;Demonstration;Handout    Comprehension  Verbalized understanding;Returned demonstration       PT Short Term Goals - 11/22/19 1148      PT SHORT TERM GOAL #1   Title  Pt will increase hip MMT to at least 4/5 for increased lumbar support.    Status  Achieved      PT SHORT TERM GOAL #2   Title  Pt will be independent  and compliant with initial HEP.    Status  Achieved      PT SHORT TERM GOAL #3   Title  Pt will sleep through the night without waking up with pain.    Status  Achieved        PT Long Term Goals - 11/22/19 1149      PT LONG TERM GOAL #1   Title  Pt will increase participation in exercise without c/o increased pain.    Status  Partially Met      PT LONG TERM GOAL #2   Title  Pt will be independent with advanced HEP and progressions.    Status  Partially Met      PT LONG TERM GOAL #3   Title  Pt will demonstrate lumbar ROM WFL with <3/10 pain reported.    Status  Achieved            Plan - 11/22/19 1146    Clinical Impression Statement  Pt has made improvements in lumbar ROM, LE flexibility, and LE strength since time of eval. Pt reports less tenderness to palpation and less pain during ROM/strength testing than at time of eval. Pt still demonstrates deficits in LE strength/flexibilty and pain during lumbar exercise. Pt would benefit from continued skilled PT to address the above deficits.    Personal Factors and Comorbidities  Comorbidity 3+;Time since onset of injury/illness/exacerbation;Past/Current Experience    Examination-Activity Limitations  Bend;Carry;Lift;Stand;Stairs;Squat;Sleep    Stability/Clinical Decision Making  Stable/Uncomplicated    Clinical Decision Making  Low    Rehab Potential  Good    PT Frequency  2x / week    PT Duration  4 weeks    PT Treatment/Interventions  Spinal Manipulations;Joint Manipulations;Vasopneumatic Device;Passive range of motion;Manual techniques;Dry needling;Neuromuscular re-education;Patient/family education;Balance training;Therapeutic exercise;Therapeutic activities;Functional mobility training;Stair training;Gait training;Iontophoresis '4mg'$ /ml Dexamethasone;ADLs/Self Care Home Management;Moist Heat;Traction;Electrical Stimulation;Cryotherapy    PT Next Visit Plan  progress lumbar and LE ex's, manual as indicated, ionto and DN if  indicated    PT Home Exercise Plan  clams, modified thomasstretch, bridging, piriformis stretch supine, child's pose    Consulted and Agree with Plan of Care  Patient  Patient will benefit from skilled therapeutic intervention in order to improve the following deficits and impairments:  Decreased strength, Postural dysfunction, Decreased balance, Hypomobility, Impaired flexibility, Improper body mechanics, Pain, Impaired perceived functional ability, Increased fascial restricitons, Decreased range of motion  Visit Diagnosis: Other abnormalities of gait and mobility  Muscle weakness (generalized)  Chronic bilateral low back pain without sciatica     Problem List Patient Active Problem List   Diagnosis Date Noted  . Hypertension 07/29/2018  . 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 Atrial fibrillation ([place]) 08/22/2013  . Gastric ulcer with hemorrhage 08/22/2013  . Hypercholesteremia   . Vitamin B12 deficiency   . Microcytic anemia 02/26/2012  . Hyperlipidemia 02/26/2012  . Palpitations 02/26/2012   Amador Cunas, PT, DPT [name] Nils Thor 11/22/2019, 11:50 AM  [place], [place], [address] Phone: [phone]   Fax:  [phone]  Name: Jenna [name] MRN: [id number] Date of Birth: [birth year]

## 2019-11-24 ENCOUNTER — Encounter: Payer: Self-pay | Admitting: Physical Therapy

## 2019-11-24 ENCOUNTER — Ambulatory Visit: Payer: Medicare HMO | Admitting: Physical Therapy

## 2019-11-24 ENCOUNTER — Other Ambulatory Visit: Payer: Self-pay

## 2019-11-24 DIAGNOSIS — M545 Low back pain: Secondary | ICD-10-CM | POA: Diagnosis not present

## 2019-11-24 DIAGNOSIS — M6281 Muscle weakness (generalized): Secondary | ICD-10-CM | POA: Diagnosis not present

## 2019-11-24 DIAGNOSIS — G8929 Other chronic pain: Secondary | ICD-10-CM

## 2019-11-24 DIAGNOSIS — R2689 Other abnormalities of gait and mobility: Secondary | ICD-10-CM

## 2019-11-24 DIAGNOSIS — M810 Age-related osteoporosis without current pathological fracture: Secondary | ICD-10-CM | POA: Diagnosis not present

## 2019-11-24 NOTE — Therapy (Signed)
[place], [place], [address] Phone: [phone]   Fax:  [phone]  Physical Therapy Treatment  Patient Details  Name: Jenna [name] MRN: [id number] Date of Birth: 1955-09-09 No data recorded  Encounter Date: 11/24/2019  PT End of Session - 11/24/19 1133    Visit Number  9    Date for PT Re-Evaluation  01/22/20    Authorization Type  Aetna Medicare    PT Start Time  1100    PT Stop Time  1145    PT Time Calculation (min)  45 min    Activity Tolerance  Patient tolerated treatment well    Behavior During Therapy  [facility] for tasks assessed/performed       Past Medical History:  Diagnosis Date  . Allergic rhinitis   . Atrial fibrillation ([place]) 08/22/2013  . Colon polyp 2008   on last colonoscopy with Dr. Collene Mares in 2008  . Deviated nasal septum   . Diverticulosis 2008   on last colonoscopy with Dr. Collene Mares in 2008  . Gastric ulcer with hemorrhage   . GERD (gastroesophageal reflux disease)   . Hypercholesteremia   . Hypertension 07/29/2018  . Iron deficiency anemia    After ulcer in 2013, resolved  . Osteoarthritis   . Vitamin B12 deficiency     Past Surgical History:  Procedure Laterality Date  . CESAREAN SECTION    . ESOPHAGOGASTRODUODENOSCOPY N/A 08/10/2015   Procedure: ESOPHAGOGASTRODUODENOSCOPY (EGD);  Surgeon: Carol Ada, MD;  Location: [facility] ENDOSCOPY;  Service: Endoscopy;  Laterality: N/A;  . HIATAL HERNIA REPAIR  08/15/2015  . LAPAROSCOPIC NISSEN FUNDOPLICATION  25/18/9842  . LAPAROSCOPIC NISSEN FUNDOPLICATION N/A 07/24/1279   Procedure: LAPAROSCOPIC HIATAL HERNIA REPAIR AND NISSEN FUNDOPLICATION;  Surgeon: Ralene Ok, MD;  Location: [place];  Service: General;  Laterality: N/A;  . NASAL SINUS SURGERY    . TONSILLECTOMY      There were no vitals filed for this visit.  Subjective Assessment - 11/24/19 1113    Subjective  Pt reports LBP is feeling a little better today; would  like to try DN again.    Currently in Pain?  Yes    Pain Score  1     Pain Location  Back    Pain Orientation  Lower                       [facility] Adult PT Treatment/Exercise - 11/24/19 0001      Lumbar Exercises: Stretches   Single Knee to Chest Stretch  5 reps;10 seconds    Single Knee to Chest Stretch Limitations  with rotation and piriformis stretch    Double Knee to Chest Stretch  5 reps    Double Knee to Chest Stretch Limitations  on exercise ball with bridge    Lower Trunk Rotation  5 reps;10 seconds    Lower Trunk Rotation Limitations  on exercise ball      Lumbar Exercises: Aerobic   Nustep  L5 x 8 min      Knee/Hip Exercises: Machines for Strengthening   Cybex Knee Extension  10# 2x10 BLE    Cybex Knee Flexion  20# 2x10 BLE    Total Gym Leg Press  20lb BLE 2x10    Hip Cybex  lat pulls and rows 25# 2x10    Other Machine  2x10 lumbar extenstion with black TB  PT Short Term Goals - 11/22/19 1148      PT SHORT TERM GOAL #1   Title  Pt will increase hip MMT to at least 4/5 for increased lumbar support.    Status  Achieved      PT SHORT TERM GOAL #2   Title  Pt will be independent and compliant with initial HEP.    Status  Achieved      PT SHORT TERM GOAL #3   Title  Pt will sleep through the night without waking up with pain.    Status  Achieved        PT Long Term Goals - 11/22/19 1149      PT LONG TERM GOAL #1   Title  Pt will increase participation in exercise without c/o increased pain.    Status  Partially Met      PT LONG TERM GOAL #2   Title  Pt will be independent with advanced HEP and progressions.    Status  Partially Met      PT LONG TERM GOAL #3   Title  Pt will demonstrate lumbar ROM WFL with <3/10 pain reported.    Status  Achieved            Plan - 11/24/19 1133    Clinical Impression Statement  Pt continues to make improvements in lumbar ROM. Pt reports less pain during rest and activity.  Continuing to progress LE/lumbar strength and flexibility.    PT Treatment/Interventions  Spinal Manipulations;Joint Manipulations;Vasopneumatic Device;Passive range of motion;Manual techniques;Dry needling;Neuromuscular re-education;Patient/family education;Balance training;Therapeutic exercise;Therapeutic activities;Functional mobility training;Stair training;Gait training;Iontophoresis '4mg'$ /ml Dexamethasone;ADLs/Self Care Home Management;Moist Heat;Traction;Electrical Stimulation;Cryotherapy    PT Next Visit Plan  progress lumbar and LE ex's, manual as indicated, ionto and DN if indicated    Consulted and Agree with Plan of Care  Patient       Patient will benefit from skilled therapeutic intervention in order to improve the following deficits and impairments:  Decreased strength, Postural dysfunction, Decreased balance, Hypomobility, Impaired flexibility, Improper body mechanics, Pain, Impaired perceived functional ability, Increased fascial restricitons, Decreased range of motion  Visit Diagnosis: Other abnormalities of gait and mobility  Muscle weakness (generalized)  Chronic bilateral low back pain without sciatica     Problem List Patient Active Problem List   Diagnosis Date Noted  . Hypertension 07/29/2018  . 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 Atrial fibrillation ([place]) 08/22/2013  . Gastric ulcer with hemorrhage 08/22/2013  . Hypercholesteremia   . Vitamin B12 deficiency   . Microcytic anemia 02/26/2012  . Hyperlipidemia 02/26/2012  . Palpitations 02/26/2012   Amador Cunas, PT, DPT [name] [name] 11/24/2019, 11:35 AM  [place], [place], [address] Phone: [phone]   Fax:  [phone]  Name: Jenna [name] MRN: [id number] Date of Birth: 04/12/1956

## 2019-11-30 ENCOUNTER — Other Ambulatory Visit: Payer: Self-pay

## 2019-11-30 ENCOUNTER — Ambulatory Visit: Payer: Medicare HMO | Admitting: Physical Therapy

## 2019-11-30 ENCOUNTER — Encounter: Payer: Self-pay | Admitting: Physical Therapy

## 2019-11-30 DIAGNOSIS — R2689 Other abnormalities of gait and mobility: Secondary | ICD-10-CM

## 2019-11-30 DIAGNOSIS — G8929 Other chronic pain: Secondary | ICD-10-CM

## 2019-11-30 DIAGNOSIS — M6281 Muscle weakness (generalized): Secondary | ICD-10-CM | POA: Diagnosis not present

## 2019-11-30 DIAGNOSIS — M545 Low back pain: Secondary | ICD-10-CM | POA: Diagnosis not present

## 2019-11-30 NOTE — Therapy (Signed)
Jenna [name] [place], [place], [address] Phone: [phone]   Fax:  [phone]  Physical Therapy Treatment Progress Note Reporting Period 10/13/2019 to 11/30/2019  See note below for Objective Data and Assessment of Progress/Goals.      Patient Details  Name: Jenna [name] MRN: [id number] Date of Birth: 05-15-1956 No data recorded  Encounter Date: 11/30/2019  PT End of Session - 11/30/19 1649    Visit Number  10    Date for PT Re-Evaluation  01/22/20    PT Start Time  1615    PT Stop Time  1700    PT Time Calculation (min)  45 min    Activity Tolerance  Patient tolerated treatment well    Behavior During Therapy  [facility] for tasks assessed/performed       Past Medical History:  Diagnosis Date  . Allergic rhinitis   . Atrial fibrillation ([place]) 08/22/2013  . Colon polyp 2008   on last colonoscopy with Dr. Collene Mares in 2008  . Deviated nasal septum   . Diverticulosis 2008   on last colonoscopy with Dr. Collene Mares in 2008  . Gastric ulcer with hemorrhage   . GERD (gastroesophageal reflux disease)   . Hypercholesteremia   . Hypertension 07/29/2018  . Iron deficiency anemia    After ulcer in 2013, resolved  . Osteoarthritis   . Vitamin B12 deficiency     Past Surgical History:  Procedure Laterality Date  . CESAREAN SECTION    . ESOPHAGOGASTRODUODENOSCOPY N/A 08/10/2015   Procedure: ESOPHAGOGASTRODUODENOSCOPY (EGD);  Surgeon: Carol Ada, MD;  Location: [facility] ENDOSCOPY;  Service: Endoscopy;  Laterality: N/A;  . HIATAL HERNIA REPAIR  08/15/2015  . LAPAROSCOPIC NISSEN FUNDOPLICATION  65/53/7482  . LAPAROSCOPIC NISSEN FUNDOPLICATION N/A 01/24/8674   Procedure: LAPAROSCOPIC HIATAL HERNIA REPAIR AND NISSEN FUNDOPLICATION;  Surgeon: Ralene Ok, MD;  Location: [place];  Service: General;  Laterality: N/A;  . NASAL SINUS SURGERY    . TONSILLECTOMY      There were no vitals filed for this visit.  Subjective  Assessment - 11/30/19 1615    Subjective  Pt reports she went to an office to receive a treatment of "osteogenic loading." Pt states that LBP was much worse for 3 days following this tx. Pt reports that she is feeling better now and would like to try DN again.    Currently in Pain?  Yes    Pain Score  2     Pain Location  Back    Pain Orientation  Lower                        [facility] Adult PT Treatment/Exercise - 11/30/19 0001      Lumbar Exercises: Aerobic   Nustep  L5 x 10 min      Lumbar Exercises: Standing   Shoulder Extension  Strengthening;Both;15 reps    Shoulder Extension Limitations  10#    Other Standing Lumbar Exercises  10 reps upper body rotations with 10# each side      Knee/Hip Exercises: Machines for Strengthening   Cybex Knee Extension  10# 2x10 BLE    Cybex Knee Flexion  20# 2x10 BLE    Total Gym Leg Press  20lb BLE 2x10    Hip Cybex  lat pulls 35# and rows 25# 2x10    Other Machine  2x10 lumbar extenstion with black TB      Manual Therapy   Manual  Therapy  Soft tissue mobilization    Soft tissue mobilization  STM to R lumbar paraspinals, R glute, and R piriformis               PT Short Term Goals - 11/22/19 1148      PT SHORT TERM GOAL #1   Title  Pt will increase hip MMT to at least 4/5 for increased lumbar support.    Status  Achieved      PT SHORT TERM GOAL #2   Title  Pt will be independent and compliant with initial HEP.    Status  Achieved      PT SHORT TERM GOAL #3   Title  Pt will sleep through the night without waking up with pain.    Status  Achieved        PT Long Term Goals - 11/30/19 1651      PT LONG TERM GOAL #1   Title  Pt will increase participation in exercise without c/o increased pain.    Status  Partially Met      PT LONG TERM GOAL #2   Title  Pt will be independent with advanced HEP and progressions.    Status  Partially Met      PT LONG TERM GOAL #3   Title  Pt will demonstrate lumbar ROM WFL  with <3/10 pain reported.    Status  Achieved            Plan - 11/30/19 1650    Clinical Impression Statement  Progressed lumbar and LE strength and flexibility this rx; pt tolerated ex's well. Pt requested DN d/t reports of improvements from DN last rx. Pt making steady progress towards goals, still needs core stab and lumbar flexibility.    PT Treatment/Interventions  Spinal Manipulations;Joint Manipulations;Vasopneumatic Device;Passive range of motion;Manual techniques;Dry needling;Neuromuscular re-education;Patient/family education;Balance training;Therapeutic exercise;Therapeutic activities;Functional mobility training;Stair training;Gait training;Iontophoresis 79m/ml Dexamethasone;ADLs/Self Care Home Management;Moist Heat;Traction;Electrical Stimulation;Cryotherapy    PT Next Visit Plan  progress lumbar and LE ex's, manual as indicated, ionto and DN if indicated    Consulted and Agree with Plan of Care  Patient       Patient will benefit from skilled therapeutic intervention in order to improve the following deficits and impairments:  Decreased strength, Postural dysfunction, Decreased balance, Hypomobility, Impaired flexibility, Improper body mechanics, Pain, Impaired perceived functional ability, Increased fascial restricitons, Decreased range of motion  Visit Diagnosis: Other abnormalities of gait and mobility  Muscle weakness (generalized)  Chronic bilateral low back pain without sciatica     Problem List Patient Active Problem List   Diagnosis Date Noted  . Hypertension 07/29/2018  . 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 Atrial fibrillation ([place] 08/22/2013  . Gastric ulcer with hemorrhage 08/22/2013  . Hypercholesteremia   . Vitamin B12 deficiency   . Microcytic anemia 02/26/2012  . Hyperlipidemia 02/26/2012  . Palpitations 02/26/2012   AAmador Cunas PT, DPT ADonald ProseSugg 11/30/2019, 4:52 PM  [address] [address]Phone: [phone]  Fax:  [phone] Name: SJenah VanastenMRN: [id number] of Birth: 808/11/1955

## 2019-12-13 ENCOUNTER — Encounter: Payer: Self-pay | Admitting: Physical Therapy

## 2019-12-13 ENCOUNTER — Ambulatory Visit: Payer: Medicare HMO | Admitting: Physical Therapy

## 2019-12-13 ENCOUNTER — Other Ambulatory Visit: Payer: Self-pay

## 2019-12-13 DIAGNOSIS — R2689 Other abnormalities of gait and mobility: Secondary | ICD-10-CM

## 2019-12-13 DIAGNOSIS — M545 Low back pain, unspecified: Secondary | ICD-10-CM

## 2019-12-13 DIAGNOSIS — G8929 Other chronic pain: Secondary | ICD-10-CM

## 2019-12-13 DIAGNOSIS — M6281 Muscle weakness (generalized): Secondary | ICD-10-CM

## 2019-12-13 NOTE — Therapy (Signed)
[place], [place], [address] Phone: [phone]   Fax:  [phone]  Physical Therapy Treatment  Patient Details  Name: Jenna [name] MRN: [id number] Date of Birth: May 28, 1956 No data recorded  Encounter Date: 12/13/2019  PT End of Session - 12/13/19 1616    Visit Number  11    Date for PT Re-Evaluation  01/22/20    PT Start Time  1445    PT Stop Time  1535    PT Time Calculation (min)  50 min    Activity Tolerance  Patient tolerated treatment well    Behavior During Therapy  [facility] for tasks assessed/performed       Past Medical History:  Diagnosis Date  . Allergic rhinitis   . Atrial fibrillation ([place]) 08/22/2013  . Colon polyp 2008   on last colonoscopy with Dr. Collene Mares in 2008  . Deviated nasal septum   . Diverticulosis 2008   on last colonoscopy with Dr. Collene Mares in 2008  . Gastric ulcer with hemorrhage   . GERD (gastroesophageal reflux disease)   . Hypercholesteremia   . Hypertension 07/29/2018  . Iron deficiency anemia    After ulcer in 2013, resolved  . Osteoarthritis   . Vitamin B12 deficiency     Past Surgical History:  Procedure Laterality Date  . CESAREAN SECTION    . ESOPHAGOGASTRODUODENOSCOPY N/A 08/10/2015   Procedure: ESOPHAGOGASTRODUODENOSCOPY (EGD);  Surgeon: Carol Ada, MD;  Location: [facility] ENDOSCOPY;  Service: Endoscopy;  Laterality: N/A;  . HIATAL HERNIA REPAIR  08/15/2015  . LAPAROSCOPIC NISSEN FUNDOPLICATION  82/12/154  . LAPAROSCOPIC NISSEN FUNDOPLICATION N/A 1/53/7943   Procedure: LAPAROSCOPIC HIATAL HERNIA REPAIR AND NISSEN FUNDOPLICATION;  Surgeon: Ralene Ok, MD;  Location: [place];  Service: General;  Laterality: N/A;  . NASAL SINUS SURGERY    . TONSILLECTOMY      There were no vitals filed for this visit.  Subjective Assessment - 12/13/19 1455    Subjective  Pt reports she went to a chiropractor and received a "machine adjustement" and was sore for 5  days following this visit. Pt reports she would like to try DN again.    Currently in Pain?  Yes    Pain Score  3     Pain Location  Back    Pain Orientation  Lower                        [facility] Adult PT Treatment/Exercise - 12/13/19 0001      Lumbar Exercises: Aerobic   Nustep  L5 x 8 min      Knee/Hip Exercises: Machines for Strengthening   Cybex Knee Extension  10# 2x10 BLE    Cybex Knee Flexion  20# 2x10 BLE    Total Gym Leg Press  20lb BLE 2x10    Hip Cybex  lat pulls 20# and rows 20# 2x15   weight reduced d/t shoulder pain complaints   Other Machine  2x10 lumbar extenstion with black TB      Manual Therapy   Manual Therapy  Soft tissue mobilization    Soft tissue mobilization  STM to R lumbar paraspinals, R glute, and R piriformis               PT Short Term Goals - 11/22/19 1148      PT SHORT TERM GOAL #1   Title  Pt will increase hip MMT to at least 4/5 for  increased lumbar support.    Status  Achieved      PT SHORT TERM GOAL #2   Title  Pt will be independent and compliant with initial HEP.    Status  Achieved      PT SHORT TERM GOAL #3   Title  Pt will sleep through the night without waking up with pain.    Status  Achieved        PT Long Term Goals - 11/30/19 1651      PT LONG TERM GOAL #1   Title  Pt will increase participation in exercise without c/o increased pain.    Status  Partially Met      PT LONG TERM GOAL #2   Title  Pt will be independent with advanced HEP and progressions.    Status  Partially Met      PT LONG TERM GOAL #3   Title  Pt will demonstrate lumbar ROM WFL with <3/10 pain reported.    Status  Achieved            Plan - 12/13/19 1616    Clinical Impression Statement  Pt responded well to progression of lumbar and LE strength/flexibility; no complaints of increased LBP during exercise. Pt requested DN; responds well to this tx. Continue to progress core stab and lumbar flexiblilty.    PT  Treatment/Interventions  Spinal Manipulations;Joint Manipulations;Vasopneumatic Device;Passive range of motion;Manual techniques;Dry needling;Neuromuscular re-education;Patient/family education;Balance training;Therapeutic exercise;Therapeutic activities;Functional mobility training;Stair training;Gait training;Iontophoresis '4mg'$ /ml Dexamethasone;ADLs/Self Care Home Management;Moist Heat;Traction;Electrical Stimulation;Cryotherapy    PT Next Visit Plan  progress lumbar and LE ex's, manual as indicated, ionto and DN if indicated    Consulted and Agree with Plan of Care  Patient       Patient will benefit from skilled therapeutic intervention in order to improve the following deficits and impairments:  Decreased strength, Postural dysfunction, Decreased balance, Hypomobility, Impaired flexibility, Improper body mechanics, Pain, Impaired perceived functional ability, Increased fascial restricitons, Decreased range of motion  Visit Diagnosis: Other abnormalities of gait and mobility  Muscle weakness (generalized)  Chronic bilateral low back pain without sciatica     Problem List Patient Active Problem List   Diagnosis Date Noted  . Hypertension 07/29/2018  . 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 Atrial fibrillation ([place]) 08/22/2013  . Gastric ulcer with hemorrhage 08/22/2013  . Hypercholesteremia   . Vitamin B12 deficiency   . Microcytic anemia 02/26/2012  . Hyperlipidemia 02/26/2012  . Palpitations 02/26/2012   Amador Cunas, PT, DPT [name] Earlyn Sylvan 12/13/2019, 4:19 PM  [address] [place], [place], [address] Phone: [phone]   Fax:  [phone]  Name: Beatris Belen MRN: [id number] Date of Birth: 1956/01/01

## 2019-12-15 ENCOUNTER — Ambulatory Visit: Payer: Medicare HMO | Admitting: Physical Therapy

## 2019-12-15 ENCOUNTER — Encounter: Payer: Self-pay | Admitting: Physical Therapy

## 2019-12-15 ENCOUNTER — Other Ambulatory Visit: Payer: Self-pay

## 2019-12-15 DIAGNOSIS — G8929 Other chronic pain: Secondary | ICD-10-CM | POA: Diagnosis not present

## 2019-12-15 DIAGNOSIS — M6281 Muscle weakness (generalized): Secondary | ICD-10-CM | POA: Diagnosis not present

## 2019-12-15 DIAGNOSIS — R2689 Other abnormalities of gait and mobility: Secondary | ICD-10-CM | POA: Diagnosis not present

## 2019-12-15 DIAGNOSIS — M545 Low back pain: Secondary | ICD-10-CM | POA: Diagnosis not present

## 2019-12-15 NOTE — Therapy (Signed)
[place], [place], [address] Phone: [phone]   Fax:  [phone]  Physical Therapy Treatment  Patient Details  Name: Jenna [name] MRN: [id number] Date of Birth: 1955/08/21 No data recorded  Encounter Date: 12/15/2019  PT End of Session - 12/15/19 1611    Visit Number  12    Date for PT Re-Evaluation  01/22/20    Authorization Type  Aetna Medicare    PT Start Time  1536    PT Stop Time  1615    PT Time Calculation (min)  39 min    Activity Tolerance  Patient tolerated treatment well    Behavior During Therapy  [facility] for tasks assessed/performed       Past Medical History:  Diagnosis Date  . Allergic rhinitis   . Atrial fibrillation ([place]) 08/22/2013  . Colon polyp 2008   on last colonoscopy with Dr. Collene Mares in 2008  . Deviated nasal septum   . Diverticulosis 2008   on last colonoscopy with Dr. Collene Mares in 2008  . Gastric ulcer with hemorrhage   . GERD (gastroesophageal reflux disease)   . Hypercholesteremia   . Hypertension 07/29/2018  . Iron deficiency anemia    After ulcer in 2013, resolved  . Osteoarthritis   . Vitamin B12 deficiency     Past Surgical History:  Procedure Laterality Date  . CESAREAN SECTION    . ESOPHAGOGASTRODUODENOSCOPY N/A 08/10/2015   Procedure: ESOPHAGOGASTRODUODENOSCOPY (EGD);  Surgeon: Carol Ada, MD;  Location: [facility] ENDOSCOPY;  Service: Endoscopy;  Laterality: N/A;  . HIATAL HERNIA REPAIR  08/15/2015  . LAPAROSCOPIC NISSEN FUNDOPLICATION  00/37/0488  . LAPAROSCOPIC NISSEN FUNDOPLICATION N/A 8/91/6945   Procedure: LAPAROSCOPIC HIATAL HERNIA REPAIR AND NISSEN FUNDOPLICATION;  Surgeon: Ralene Ok, MD;  Location: [place];  Service: General;  Laterality: N/A;  . NASAL SINUS SURGERY    . TONSILLECTOMY      There were no vitals filed for this visit.  Subjective Assessment - 12/15/19 1540    Subjective  Pt reports she is feeling much better today; a little  sore in one spot in LB. Responded well to DN.    Currently in Pain?  Yes    Pain Score  2     Pain Location  Back    Pain Orientation  Lower                        [facility] Adult PT Treatment/Exercise - 12/15/19 0001      Lumbar Exercises: Stretches   Single Knee to Chest Stretch  5 reps;10 seconds    Single Knee to Chest Stretch Limitations  with rotation and piriformis stretch      Lumbar Exercises: Aerobic   Recumbent Bike  x7 min      Lumbar Exercises: Standing   Shoulder Extension  Strengthening;Both;15 reps    Shoulder Extension Limitations  10#    Other Standing Lumbar Exercises  yellow ball toss to trampoline w/ rotation 2 x10 B      Lumbar Exercises: Seated   Other Seated Lumbar Exercises  seated fwd/lat flexion stretch w/ 10 sec hold x5 each way      Knee/Hip Exercises: Machines for Strengthening   Cybex Knee Extension  10# 2x15 BLE    Cybex Knee Flexion  25#, 20# 2x15 BLE    Total Gym Leg Press  20lb BLE 2x10    Hip Cybex  lat pulls 25# rows  20# 2x15    Other Machine  2x10 lumbar extenstion with black TB, AR press 5# 1x10               PT Short Term Goals - 11/22/19 1148      PT SHORT TERM GOAL #1   Title  Pt will increase hip MMT to at least 4/5 for increased lumbar support.    Status  Achieved      PT SHORT TERM GOAL #2   Title  Pt will be independent and compliant with initial HEP.    Status  Achieved      PT SHORT TERM GOAL #3   Title  Pt will sleep through the night without waking up with pain.    Status  Achieved        PT Long Term Goals - 11/30/19 1651      PT LONG TERM GOAL #1   Title  Pt will increase participation in exercise without c/o increased pain.    Status  Partially Met      PT LONG TERM GOAL #2   Title  Pt will be independent with advanced HEP and progressions.    Status  Partially Met      PT LONG TERM GOAL #3   Title  Pt will demonstrate lumbar ROM WFL with <3/10 pain reported.    Status  Achieved             Plan - 12/15/19 1614    Clinical Impression Statement  Pt arrived late for appt. Pt responded well to progression of lumbar and LE strength/flexibility; some complaints of shoulder soreness during ex. Pt going on vacation for 2 weeks; instructed to continue ex's at home; assess progress/goals and further need for PT when pt returns.    PT Treatment/Interventions  Spinal Manipulations;Joint Manipulations;Vasopneumatic Device;Passive range of motion;Manual techniques;Dry needling;Neuromuscular re-education;Patient/family education;Balance training;Therapeutic exercise;Therapeutic activities;Functional mobility training;Stair training;Gait training;Iontophoresis 19m/ml Dexamethasone;ADLs/Self Care Home Management;Moist Heat;Traction;Electrical Stimulation;Cryotherapy    PT Next Visit Plan  progress lumbar and LE ex's, manual as indicated, ionto and DN if indicated    Consulted and Agree with Plan of Care  Patient       Patient will benefit from skilled therapeutic intervention in order to improve the following deficits and impairments:  Decreased strength, Postural dysfunction, Decreased balance, Hypomobility, Impaired flexibility, Improper body mechanics, Pain, Impaired perceived functional ability, Increased fascial restricitons, Decreased range of motion  Visit Diagnosis: Other abnormalities of gait and mobility  Muscle weakness (generalized)  Chronic bilateral low back pain without sciatica     Problem List Patient Active Problem List   Diagnosis Date Noted  . Hypertension 07/29/2018  . 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 Atrial [place] ([place] 08/22/2013  . Gastric ulcer with hemorrhage 08/22/2013  . Hypercholesteremia   . Vitamin B12 deficiency   . Microcytic anemia 02/26/2012  . Hyperlipidemia 02/26/2012  . Palpitations 02/26/2012   AAmador Cunas PT, DPT ADonald ProseSugg 12/15/2019, 4:16 PM  [place] [address]Phone: [phone]  Fax:  [phone] Name: Jenna KinzieMRN: [id number] of Birth: 8December 22, 1957

## 2020-01-03 ENCOUNTER — Encounter: Payer: Self-pay | Admitting: Physical Therapy

## 2020-01-03 ENCOUNTER — Ambulatory Visit: Payer: Medicare HMO | Attending: Student | Admitting: Physical Therapy

## 2020-01-03 ENCOUNTER — Other Ambulatory Visit: Payer: Self-pay

## 2020-01-03 DIAGNOSIS — R2689 Other abnormalities of gait and mobility: Secondary | ICD-10-CM | POA: Diagnosis not present

## 2020-01-03 DIAGNOSIS — M6281 Muscle weakness (generalized): Secondary | ICD-10-CM

## 2020-01-03 DIAGNOSIS — G8929 Other chronic pain: Secondary | ICD-10-CM | POA: Diagnosis not present

## 2020-01-03 DIAGNOSIS — M545 Low back pain: Secondary | ICD-10-CM | POA: Insufficient documentation

## 2020-01-03 NOTE — Therapy (Signed)
[place], [place], [address] Phone: [phone]   Fax:  [phone]  Physical Therapy Treatment  Patient Details  Name: Jenna [name] MRN: [id number] Date of Birth: 03/08/1956 No data recorded  Encounter Date: 01/03/2020   PT End of Session - 01/03/20 1140    Visit Number 13    Date for PT Re-Evaluation 01/22/20    Authorization Type Aetna Medicare    PT Start Time 1104    PT Stop Time 1150    PT Time Calculation (min) 46 min    Activity Tolerance Patient tolerated treatment well    Behavior During Therapy [facility] for tasks assessed/performed           Past Medical History:  Diagnosis Date  . Allergic rhinitis   . Atrial fibrillation ([place]) 08/22/2013  . Colon polyp 2008   on last colonoscopy with Dr. Collene Mares in 2008  . Deviated nasal septum   . Diverticulosis 2008   on last colonoscopy with Dr. Collene Mares in 2008  . Gastric ulcer with hemorrhage   . GERD (gastroesophageal reflux disease)   . Hypercholesteremia   . Hypertension 07/29/2018  . Iron deficiency anemia    After ulcer in 2013, resolved  . Osteoarthritis   . Vitamin B12 deficiency     Past Surgical History:  Procedure Laterality Date  . CESAREAN SECTION    . ESOPHAGOGASTRODUODENOSCOPY N/A 08/10/2015   Procedure: ESOPHAGOGASTRODUODENOSCOPY (EGD);  Surgeon: Carol Ada, MD;  Location: [facility] ENDOSCOPY;  Service: Endoscopy;  Laterality: N/A;  . HIATAL HERNIA REPAIR  08/15/2015  . LAPAROSCOPIC NISSEN FUNDOPLICATION  40/37/5436  . LAPAROSCOPIC NISSEN FUNDOPLICATION N/A 0/67/7034   Procedure: LAPAROSCOPIC HIATAL HERNIA REPAIR AND NISSEN FUNDOPLICATION;  Surgeon: Ralene Ok, MD;  Location: [place];  Service: General;  Laterality: N/A;  . NASAL SINUS SURGERY    . TONSILLECTOMY      There were no vitals filed for this visit.   Subjective Assessment - 01/03/20 1107    Subjective Pt reports she is feeling a little better but is still  having trouble with soreness in LB. Pt expressed interest in continuing DN.    Currently in Pain? Yes    Pain Score 2     Pain Location Back    Pain Orientation Lower                             [facility] Adult PT Treatment/Exercise - 01/03/20 0001      Lumbar Exercises: Aerobic   Recumbent Bike x 6 min      Knee/Hip Exercises: Machines for Strengthening   Cybex Knee Extension 10# 2x10 BLE    Cybex Knee Flexion 25# 2x10    Total Gym Leg Press 20lb BLE 2x10    Hip Cybex lat pulls 25# rows 25# 2x15, shoulder ext 10# 2x15    Other Machine 2x15 lumbar extenstion with black TB, AR press 5# 1x10                    PT Short Term Goals - 11/22/19 1148      PT SHORT TERM GOAL #1   Title Pt will increase hip MMT to at least 4/5 for increased lumbar support.    Status Achieved      PT SHORT TERM GOAL #2   Title Pt will be independent and compliant with initial HEP.    Status Achieved  PT SHORT TERM GOAL #3   Title Pt will sleep through the night without waking up with pain.    Status Achieved             PT Long Term Goals - 11/30/19 1651      PT LONG TERM GOAL #1   Title Pt will increase participation in exercise without c/o increased pain.    Status Partially Met      PT LONG TERM GOAL #2   Title Pt will be independent with advanced HEP and progressions.    Status Partially Met      PT LONG TERM GOAL #3   Title Pt will demonstrate lumbar ROM WFL with <3/10 pain reported.    Status Achieved                 Plan - 01/03/20 1141    Clinical Impression Statement Pt responded well to progression of lumbar and LE strength/flexibility. Reported R shoulder pain during shoulder ext and lat pulldowns. Pt reports doing better overall but still has soreness in one spot in LB. Pt responds well to DN; would like to continue PT for a few more weeks.    PT Treatment/Interventions Spinal Manipulations;Joint Manipulations;Vasopneumatic Device;Passive  range of motion;Manual techniques;Dry needling;Neuromuscular re-education;Patient/family education;Balance training;Therapeutic exercise;Therapeutic activities;Functional mobility training;Stair training;Gait training;Iontophoresis 46m/ml Dexamethasone;ADLs/Self Care Home Management;Moist Heat;Traction;Electrical Stimulation;Cryotherapy    PT Next Visit Plan progress lumbar and LE ex's, manual as indicated, ionto and DN if indicated    Consulted and Agree with Plan of Care Patient           Patient will benefit from skilled therapeutic intervention in order to improve the following deficits and impairments:  Decreased strength, Postural dysfunction, Decreased balance, Hypomobility, Impaired flexibility, Improper body mechanics, Pain, Impaired perceived functional ability, Increased fascial restricitons, Decreased range of motion  Visit Diagnosis: Other abnormalities of gait and mobility  Muscle weakness (generalized)  Chronic bilateral low back pain without sciatica     Problem List Patient Active Problem List   Diagnosis Date Noted  . Hypertension 07/29/2018  . 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 Atrial fibrillation ([place] 08/22/2013  . Gastric ulcer with hemorrhage 08/22/2013  . Hypercholesteremia   . Vitamin B12 deficiency   . Microcytic anemia 02/26/2012  . Hyperlipidemia 02/26/2012  . Palpitations 02/26/2012   AAmador Cunas PT, DPT ADonald ProseSugg 01/03/2020, 11:51 AM  [place] [address]Phone: [phone]  Fax:  [phone] Name: Jenna EncarnacionMRN: [id number] of Birth: [birth year]

## 2020-01-05 ENCOUNTER — Ambulatory Visit: Payer: Medicare HMO | Admitting: Physical Therapy

## 2020-01-05 ENCOUNTER — Other Ambulatory Visit: Payer: Self-pay

## 2020-01-05 ENCOUNTER — Encounter: Payer: Self-pay | Admitting: Physical Therapy

## 2020-01-05 DIAGNOSIS — M6281 Muscle weakness (generalized): Secondary | ICD-10-CM | POA: Diagnosis not present

## 2020-01-05 DIAGNOSIS — M545 Low back pain: Secondary | ICD-10-CM | POA: Diagnosis not present

## 2020-01-05 DIAGNOSIS — R2689 Other abnormalities of gait and mobility: Secondary | ICD-10-CM | POA: Diagnosis not present

## 2020-01-05 DIAGNOSIS — G8929 Other chronic pain: Secondary | ICD-10-CM | POA: Diagnosis not present

## 2020-01-05 NOTE — Therapy (Signed)
[facility] [place] [address] [place], [place], [address] Phone: [phone]   Fax:  [phone]  Physical Therapy Treatment  Patient Details  Name: Jenna [name] MRN: [id number] Date of Birth: Jul 31, 1955 No data recorded  Encounter Date: 01/05/2020   PT End of Session - 01/05/20 1145    Visit Number 14    Date for PT Re-Evaluation 01/22/20    Authorization Type Aetna Medicare    PT Start Time 1103    PT Stop Time 1145    PT Time Calculation (min) 42 min    Activity Tolerance Patient tolerated treatment well    Behavior During Therapy [facility] for tasks assessed/performed           Past Medical History:  Diagnosis Date  . Allergic rhinitis   . Atrial fibrillation (HCC) 08/22/2013  . Colon polyp 2008   on last colonoscopy with Dr. Loreta Ave in 2008  . Deviated nasal septum   . Diverticulosis 2008   on last colonoscopy with Dr. Loreta Ave in 2008  . Gastric ulcer with hemorrhage   . GERD (gastroesophageal reflux disease)   . Hypercholesteremia   . Hypertension 07/29/2018  . Iron deficiency anemia    After ulcer in 2013, resolved  . Osteoarthritis   . Vitamin B12 deficiency     Past Surgical History:  Procedure Laterality Date  . CESAREAN SECTION    . ESOPHAGOGASTRODUODENOSCOPY N/A 08/10/2015   Procedure: ESOPHAGOGASTRODUODENOSCOPY (EGD);  Surgeon: Jeani Hawking, MD;  Location: [facility] ENDOSCOPY;  Service: Endoscopy;  Laterality: N/A;  . HIATAL HERNIA REPAIR  08/15/2015  . LAPAROSCOPIC NISSEN FUNDOPLICATION  08/15/2015  . LAPAROSCOPIC NISSEN FUNDOPLICATION N/A 08/15/2015   Procedure: LAPAROSCOPIC HIATAL HERNIA REPAIR AND NISSEN FUNDOPLICATION;  Surgeon: Axel Filler, MD;  [facility];  Service: General;  Laterality: N/A;  . NASAL SINUS SURGERY    . TONSILLECTOMY      There were no vitals filed for this visit.   Subjective Assessment - 01/05/20 1105    Subjective Pt reports DN really helped; states that pain is 0.5/10  today. Would like to have DN at next 2 sessions.    Currently in Pain? Yes    Pain Score 1     Pain Location Back                             [facility] Adult PT Treatment/Exercise - 01/05/20 0001      Lumbar Exercises: Aerobic   Recumbent Bike x6 min      Knee/Hip Exercises: Machines for Strengthening   Cybex Knee Extension 10# 2x15    Cybex Knee Flexion 25# 2x15    Total Gym Leg Press 30# 2x10, heel raises 1x15    Hip Cybex lat pulls 25# rows 25# 2x15, shoulder ext 10# 2x15    Other Machine 2x15 lumbar extenstion with black TB, AR press 5# 1x10      Knee/Hip Exercises: Standing   Other Standing Knee Exercises resisted gait x5 each direction 20#                    PT Short Term Goals - 11/22/19 1148      PT SHORT TERM GOAL #1   Title Pt will increase hip MMT to at least 4/5 for increased lumbar support.    Status Achieved      PT SHORT TERM GOAL #2   Title Pt will be independent  and compliant with initial HEP.    Status Achieved      PT SHORT TERM GOAL #3   Title Pt will sleep through the night without waking up with pain.    Status Achieved             PT Long Term Goals - 11/30/19 1651      PT LONG TERM GOAL #1   Title Pt will increase participation in exercise without c/o increased pain.    Status Partially Met      PT LONG TERM GOAL #2   Title Pt will be independent with advanced HEP and progressions.    Status Partially Met      PT LONG TERM GOAL #3   Title Pt will demonstrate lumbar ROM WFL with <3/10 pain reported.    Status Achieved                 Plan - 01/05/20 1145    Clinical Impression Statement Pt demonstrated core weakness and inflexibility with ex's today; tolerated progression well with minimal complaints of R shoulder pain with overhead ex's. Pt required some CGA for occasional LOB during resisted gait. Pt requested DN at next rx.    PT Treatment/Interventions Spinal Manipulations;Joint  Manipulations;Vasopneumatic Device;Passive range of motion;Manual techniques;Dry needling;Neuromuscular re-education;Patient/family education;Balance training;Therapeutic exercise;Therapeutic activities;Functional mobility training;Stair training;Gait training;Iontophoresis '4mg'$ /ml Dexamethasone;ADLs/Self Care Home Management;Moist Heat;Traction;Electrical Stimulation;Cryotherapy    PT Next Visit Plan progress lumbar and LE ex's, manual as indicated, ionto and DN if indicated    Consulted and Agree with Plan of Care Patient           Patient will benefit from skilled therapeutic intervention in order to improve the following deficits and impairments:  Decreased strength, Postural dysfunction, Decreased balance, Hypomobility, Impaired flexibility, Improper body mechanics, Pain, Impaired perceived functional ability, Increased fascial restricitons, Decreased range of motion  Visit Diagnosis: Other abnormalities of gait and mobility  Muscle weakness (generalized)  Chronic bilateral low back pain without sciatica     Problem List Patient Active Problem List   Diagnosis Date Noted  . Hypertension 07/29/2018  . 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 Atrial fibrillation ([place]) 08/22/2013  . Gastric ulcer with hemorrhage 08/22/2013  . Hypercholesteremia   . Vitamin B12 deficiency   . Microcytic anemia 02/26/2012  . Hyperlipidemia 02/26/2012  . Palpitations 02/26/2012   Amador Cunas, PT, DPT [name] Marylon Verno 01/05/2020, 11:51 AM  [place], [place], [address] Phone: [phone]   Fax:  [phone]  Name: Jenna [name] MRN: [id number] Date of Birth: 29-Oct-1955

## 2020-01-11 ENCOUNTER — Other Ambulatory Visit: Payer: Medicare HMO

## 2020-01-12 ENCOUNTER — Other Ambulatory Visit: Payer: Self-pay

## 2020-01-12 ENCOUNTER — Encounter: Payer: Self-pay | Admitting: Physical Therapy

## 2020-01-12 ENCOUNTER — Ambulatory Visit: Payer: Medicare HMO | Admitting: Physical Therapy

## 2020-01-12 DIAGNOSIS — G8929 Other chronic pain: Secondary | ICD-10-CM | POA: Diagnosis not present

## 2020-01-12 DIAGNOSIS — R2689 Other abnormalities of gait and mobility: Secondary | ICD-10-CM | POA: Diagnosis not present

## 2020-01-12 DIAGNOSIS — M6281 Muscle weakness (generalized): Secondary | ICD-10-CM

## 2020-01-12 DIAGNOSIS — M545 Low back pain: Secondary | ICD-10-CM | POA: Diagnosis not present

## 2020-01-12 NOTE — Therapy (Signed)
[facility] [place] [address] [place], [place], [address] Phone: [phone]   Fax:  [phone]  Physical Therapy Treatment  Patient Details  Name: Jenna [name] MRN: [id number] Date of Birth: 12-Aug-1955 No data recorded  Encounter Date: 01/12/2020   PT End of Session - 01/12/20 1006    Visit Number 15    Date for PT Re-Evaluation 01/22/20    Authorization Type Aetna Medicare    PT Start Time 0935    PT Stop Time 1020    PT Time Calculation (min) 45 min    Activity Tolerance Patient tolerated treatment well           Past Medical History:  Diagnosis Date   Allergic rhinitis    Atrial fibrillation (HCC) 08/22/2013   Colon polyp 2008   on last colonoscopy with Dr. Loreta Ave in 2008   Deviated nasal septum    Diverticulosis 2008   on last colonoscopy with Dr. Loreta Ave in 2008   Gastric ulcer with hemorrhage    GERD (gastroesophageal reflux disease)    Hypercholesteremia    Hypertension 07/29/2018   Iron deficiency anemia    After ulcer in 2013, resolved   Osteoarthritis    Vitamin B12 deficiency     Past Surgical History:  Procedure Laterality Date   CESAREAN SECTION     ESOPHAGOGASTRODUODENOSCOPY N/A 08/10/2015   Procedure: ESOPHAGOGASTRODUODENOSCOPY (EGD);  Surgeon: Jeani Hawking, MD;  Location: [facility] ENDOSCOPY;  Service: Endoscopy;  Laterality: N/A;   HIATAL HERNIA REPAIR  08/15/2015   LAPAROSCOPIC NISSEN FUNDOPLICATION  08/15/2015   LAPAROSCOPIC NISSEN FUNDOPLICATION N/A 08/15/2015   Procedure: LAPAROSCOPIC HIATAL HERNIA REPAIR AND NISSEN FUNDOPLICATION;  Surgeon: Axel Filler, MD;  [facility];  Service: General;  Laterality: N/A;   NASAL SINUS SURGERY     TONSILLECTOMY      There were no vitals filed for this visit.   Subjective Assessment - 01/12/20 0937    Subjective Feeling fine today    Currently in Pain? Yes    Pain Location Back    Pain Orientation Left;Right                              [facility] Adult PT Treatment/Exercise - 01/12/20 0001      Lumbar Exercises: Aerobic   Recumbent Bike L1 x 5 min      Lumbar Exercises: Standing   Other Standing Lumbar Exercises Sit to stand with OHP yellow ball 2x10     Other Standing Lumbar Exercises ovthrd bsck Ext yello wball 2x10      Knee/Hip Exercises: Machines for Strengthening   Cybex Knee Extension 10# 2x15    Cybex Knee Flexion 25# 2x15    Total Gym Leg Press 30# 2x10, heel raises 2x15    Hip Cybex lat pulls 25# rows 25# 2x15, shoulder ext 10# 2x15    Other Machine 2x15 lumbar extenstion with black TB, AR press 5# 1x10      Knee/Hip Exercises: Standing   Other Standing Knee Exercises resisted gait x5 each direction 20#                    PT Short Term Goals - 11/22/19 1148      PT SHORT TERM GOAL #1   Title Pt will increase hip MMT to at least 4/5 for increased lumbar support.    Status Achieved      PT SHORT  TERM GOAL #2   Title Pt will be independent and compliant with initial HEP.    Status Achieved      PT SHORT TERM GOAL #3   Title Pt will sleep through the night without waking up with pain.    Status Achieved             PT Long Term Goals - 11/30/19 1651      PT LONG TERM GOAL #1   Title Pt will increase participation in exercise without c/o increased pain.    Status Partially Met      PT LONG TERM GOAL #2   Title Pt will be independent with advanced HEP and progressions.    Status Partially Met      PT LONG TERM GOAL #3   Title Pt will demonstrate lumbar ROM WFL with <3/10 pain reported.    Status Achieved                 Plan - 01/12/20 1006    Clinical Impression Statement Core fatigue and weakness noted with anti rotational pressed and shoulder extensions. Cues to complete full ROM with leg press, leg curls, and with leg extensions. Some instability noted with resisted gait.    Personal Factors and Comorbidities Comorbidity 3+;Time  since onset of injury/illness/exacerbation;Past/Current Experience    Examination-Activity Limitations Bend;Carry;Lift;Stand;Stairs;Squat;Sleep    Examination-Participation Restrictions Cleaning;Community Activity;Interpersonal Relationship;Shop    Stability/Clinical Decision Making Stable/Uncomplicated    Rehab Potential Good    PT Frequency 2x / week    PT Duration 4 weeks    PT Treatment/Interventions Spinal Manipulations;Joint Manipulations;Vasopneumatic Device;Passive range of motion;Manual techniques;Dry needling;Neuromuscular re-education;Patient/family education;Balance training;Therapeutic exercise;Therapeutic activities;Functional mobility training;Stair training;Gait training;Iontophoresis '4mg'$ /ml Dexamethasone;ADLs/Self Care Home Management;Moist Heat;Traction;Electrical Stimulation;Cryotherapy    PT Next Visit Plan progress lumbar and LE ex's, manual as indicated, ionto and DN if indicated           Patient will benefit from skilled therapeutic intervention in order to improve the following deficits and impairments:  Decreased strength, Postural dysfunction, Decreased balance, Hypomobility, Impaired flexibility, Improper body mechanics, Pain, Impaired perceived functional ability, Increased fascial restricitons, Decreased range of motion  Visit Diagnosis: Chronic bilateral low back pain without sciatica  Muscle weakness (generalized)  Other abnormalities of gait and mobility     Problem List Patient Active Problem List   Diagnosis Date Noted   Hypertension 07/29/2018   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

## 2020-01-19 ENCOUNTER — Other Ambulatory Visit: Payer: Self-pay

## 2020-01-19 ENCOUNTER — Encounter: Payer: Self-pay | Admitting: Physical Therapy

## 2020-01-19 ENCOUNTER — Ambulatory Visit: Payer: Medicare HMO | Attending: Student | Admitting: Physical Therapy

## 2020-01-19 DIAGNOSIS — R2689 Other abnormalities of gait and mobility: Secondary | ICD-10-CM | POA: Diagnosis not present

## 2020-01-19 DIAGNOSIS — M6281 Muscle weakness (generalized): Secondary | ICD-10-CM | POA: Insufficient documentation

## 2020-01-19 DIAGNOSIS — M545 Low back pain: Secondary | ICD-10-CM | POA: Diagnosis not present

## 2020-01-19 DIAGNOSIS — G8929 Other chronic pain: Secondary | ICD-10-CM

## 2020-01-19 NOTE — Therapy (Signed)
[facility] [place] [address] [place], [place], [address] Phone: [phone]   Fax:  [phone]  Physical Therapy Treatment  Patient Details  Name: Jenna [name] MRN: [id number] Date of Birth: [birth year] No data recorded  Encounter Date: 01/19/2020   PT End of Session - 01/19/20 1614    Visit Number 16    Date for PT Re-Evaluation 01/22/20    PT Start Time 1532    PT Stop Time 1615    PT Time Calculation (min) 43 min    Activity Tolerance Patient tolerated treatment well    Behavior During Therapy [facility] for tasks assessed/performed           Past Medical History:  Diagnosis Date  . Allergic rhinitis   . Atrial fibrillation (HCC) 08/22/2013  . Colon polyp 2008   on last colonoscopy with Dr. Loreta Ave in 2008  . Deviated nasal septum   . Diverticulosis 2008   on last colonoscopy with Dr. Loreta Ave in 2008  . Gastric ulcer with hemorrhage   . GERD (gastroesophageal reflux disease)   . Hypercholesteremia   . Hypertension 07/29/2018  . Iron deficiency anemia    After ulcer in 2013, resolved  . Osteoarthritis   . Vitamin B12 deficiency     Past Surgical History:  Procedure Laterality Date  . CESAREAN SECTION    . ESOPHAGOGASTRODUODENOSCOPY N/A 08/10/2015   Procedure: ESOPHAGOGASTRODUODENOSCOPY (EGD);  Surgeon: Jeani Hawking, MD;  [facility]: [facility] ENDOSCOPY;  Service: Endoscopy;  Laterality: N/A;  . HIATAL HERNIA REPAIR  08/15/2015  . LAPAROSCOPIC NISSEN FUNDOPLICATION  08/15/2015  . LAPAROSCOPIC NISSEN FUNDOPLICATION N/A 08/15/2015   Procedure: LAPAROSCOPIC HIATAL HERNIA REPAIR AND NISSEN FUNDOPLICATION;  Surgeon: Axel Filler, MD;  Location: [facility] OR;  Service: General;  Laterality: N/A;  . NASAL SINUS SURGERY    . TONSILLECTOMY      There were no vitals filed for this visit.   Subjective Assessment - 01/19/20 1540    Subjective Pt reports she is feeling good today    Currently in Pain? Yes    Pain Score 1     Pain  Location Back                             [facility] Adult PT Treatment/Exercise - 01/19/20 0001      Lumbar Exercises: Aerobic   Nustep L5 x 10 min      Knee/Hip Exercises: Machines for Strengthening   Cybex Knee Extension 10# 2x15    Cybex Knee Flexion 25# 2x15    Total Gym Leg Press 40# 2x10, heel raises 2x15    Hip Cybex lat pulls 25# rows 25# 2x15, shoulder ext 10# 2x15    Other Machine 2x15 lumbar extenstion with black TB      Manual Therapy   Manual Therapy Soft tissue mobilization    Manual therapy comments skilled palpation and monitoring during DN    Soft tissue mobilization STM to R lumbar paraspinals, R glute, and R piriformis                    PT Short Term Goals - 11/22/19 1148      PT SHORT TERM GOAL #1   Title Pt will increase hip MMT to at least 4/5 for increased lumbar support.    Status Achieved      PT SHORT TERM GOAL #2   Title Pt will be independent and  compliant with initial HEP.    Status Achieved      PT SHORT TERM GOAL #3   Title Pt will sleep through the night without waking up with pain.    Status Achieved             PT Long Term Goals - 01/19/20 1541      PT LONG TERM GOAL #1   Title Pt will increase participation in exercise without c/o increased pain.    Status Achieved      PT LONG TERM GOAL #2   Title Pt will be independent with advanced HEP and progressions.    Status Achieved      PT LONG TERM GOAL #3   Title Pt will demonstrate lumbar ROM WFL with <3/10 pain reported.    Status Achieved                 Plan - 01/19/20 1614    Clinical Impression Statement Pt demonstrates improved ROM, decreased LBP, increased ability to participate in functional activities, and a plateau in progress in PT recently. Pt recommended for d/c secondary to meeting most functional goals and being please with current functional level. Pt educated on when to return if symptoms recur with pt verbalized  understanding/agreement.    PT Next Visit Plan progress lumbar and LE ex's, manual as indicated, ionto and DN if indicated    Consulted and Agree with Plan of Care Patient           Patient will benefit from skilled therapeutic intervention in order to improve the following deficits and impairments:     Visit Diagnosis: Chronic bilateral low back pain without sciatica  Muscle weakness (generalized)  Other abnormalities of gait and mobility     Problem List Patient Active Problem List   Diagnosis Date Noted  . Hypertension 07/29/2018  . 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 Atrial fibrillation (HCC) 08/22/2013  . Gastric ulcer with hemorrhage 08/22/2013  . Hypercholesteremia   . Vitamin B12 deficiency   . Microcytic anemia 02/26/2012  . Hyperlipidemia 02/26/2012  . Palpitations 02/26/2012   Lysle Rubens, PT, DPT Maryanna Shape Jakorian [place]go 01/19/2020, 4:22 PM  [facility] [place] [facility] [address] [place], [place], [address] Phone: [phone]   Fax:  [phone]  Name: Jenna [name] MRN: [id number] Date of Birth: 1956-07-11

## 2020-03-13 DIAGNOSIS — R69 Illness, unspecified: Secondary | ICD-10-CM | POA: Diagnosis not present

## 2020-03-29 DIAGNOSIS — J309 Allergic rhinitis, unspecified: Secondary | ICD-10-CM | POA: Diagnosis not present

## 2020-03-29 DIAGNOSIS — E782 Mixed hyperlipidemia: Secondary | ICD-10-CM | POA: Diagnosis not present

## 2020-03-29 DIAGNOSIS — I10 Essential (primary) hypertension: Secondary | ICD-10-CM | POA: Diagnosis not present

## 2020-03-29 DIAGNOSIS — M810 Age-related osteoporosis without current pathological fracture: Secondary | ICD-10-CM | POA: Diagnosis not present

## 2020-03-29 DIAGNOSIS — Z0000 Encounter for general adult medical examination without abnormal findings: Secondary | ICD-10-CM | POA: Diagnosis not present

## 2020-03-29 DIAGNOSIS — R8290 Unspecified abnormal findings in urine: Secondary | ICD-10-CM | POA: Diagnosis not present

## 2020-03-29 DIAGNOSIS — Z8719 Personal history of other diseases of the digestive system: Secondary | ICD-10-CM | POA: Diagnosis not present

## 2020-03-29 DIAGNOSIS — E538 Deficiency of other specified B group vitamins: Secondary | ICD-10-CM | POA: Diagnosis not present

## 2020-03-29 DIAGNOSIS — Z8669 Personal history of other diseases of the nervous system and sense organs: Secondary | ICD-10-CM | POA: Diagnosis not present

## 2020-03-29 DIAGNOSIS — I480 Paroxysmal atrial fibrillation: Secondary | ICD-10-CM | POA: Diagnosis not present

## 2020-03-30 DIAGNOSIS — M25562 Pain in left knee: Secondary | ICD-10-CM | POA: Diagnosis not present

## 2020-03-30 DIAGNOSIS — M170 Bilateral primary osteoarthritis of knee: Secondary | ICD-10-CM | POA: Diagnosis not present

## 2020-03-30 DIAGNOSIS — M1712 Unilateral primary osteoarthritis, left knee: Secondary | ICD-10-CM | POA: Diagnosis not present

## 2020-03-30 DIAGNOSIS — M25561 Pain in right knee: Secondary | ICD-10-CM | POA: Diagnosis not present

## 2020-03-30 DIAGNOSIS — M1711 Unilateral primary osteoarthritis, right knee: Secondary | ICD-10-CM | POA: Diagnosis not present

## 2020-04-09 DIAGNOSIS — S61001A Unspecified open wound of right thumb without damage to nail, initial encounter: Secondary | ICD-10-CM | POA: Diagnosis not present

## 2020-04-20 ENCOUNTER — Other Ambulatory Visit: Payer: Self-pay | Admitting: Family Medicine

## 2020-04-20 DIAGNOSIS — Z0000 Encounter for general adult medical examination without abnormal findings: Secondary | ICD-10-CM

## 2020-05-11 NOTE — Progress Notes (Signed)
Patient ID: Jenna [name]                 DOB: [birth year]                    MRN: [id number]     HPI: Jenna [name] is a 64 y.o. female patient referred to pharmacy clinic by Dr. Eldridge Dace. Her last in-person clinic visit was in March 2020 with subsequent virtual visits after due to COVID pandemic. PMH is significant for PAF, HLD, and GI ulcer.  At last visit in March 2020, she was taking highest-tolerated statin dose of rosuvastatin 10 mg weekly. She reported charley horses when she increased her rosuvastatin from once weekly to twice weekly. She previously experienced knee pain with pravastatin 40 mg daily and myalgias with simvastatin 20 mg daily which appeared the day after taking statin. Symptoms disappearred within few days of discontinuation. She was interested in starting Nexlizet which had been FDA-approved at the time, but it was not yet available on the market.  At next virtual visit in April 2020, she reported stopping rosuvastatin altogether due to tolerability issues. Nexletol was not covered by insurance, so she was started on Repatha. She reported GI issues and bloody diarrhea after her first Repatha injection.   She reports today for follow-up appointment. Her LDL was very elevated on lipid panel check at last PCP visit, so she was referred back to lipid clinic. She reports that she is not taking any lipid-lowering medications. She stopped taking Repatha after the first injection in 2019 due to bloody diarrhea. She is aware that GI issues is not a common side effect with Repatha and states that her PCP diagnosed her with diverticulitis. However, she did not feel comfortable restarting Repatha and does not want to try any other lipid-lowering injections at this time. She remains interested in starting Nexlizet and is also willing to re-try low-dose statin once weekly. Ezetimibe is on her medication list but she is unsure if she ever started this medication. She eats out often  at sports bars ~3 times weekly and acknowledges that she has a "not-so-healthy" diet but is willing to work on her diet. She exercises regularly.  Current Lipid Medications: None  Intolerances: pravastatin 40 daily (knee pain), simvastatin 20 mg daily (myalgias), rosuvastatin 10 mg once weekly (myalgias), Repatha 140 mg Q2 weeks (bloody diarrhea)  Risk Factors: family history of CAD, baseline LDL > 190   LDL goal: <100 mg/dL  Diet: Patient reports that her diet is "probably not the healthiest" Goes out to eat ~3 times weekly at sports bars. Ate mini muffins for breakfast, 1 hot dog and chips for lunch, and grilled burgers for dinner. Enjoys eating popcorn as a snack. Drinks 2 sodas daily (Diet Dr. Reino Kent, Diet Sprite) and water.  Exercise: Water aerobics or pilates 4 times weekly for 1 hour  Family History: Mother with HTN and hepatitis. Father with heart attack (age of onset: 5s) and hydrocephalus. Maternal aunt with breast cancer (age of onset: 2).  Social History: The patient  reports that she has never smoked. She has never used smokeless tobacco. She reports that she drinks alcohol (4 glasses of wine weekly). She reports that she does not use drugs.   Labs:  03/29/20: TC 301, HDL 61, TG 221, LDL 197, non-HDL 239 (no lipid-lowering therapy) 09/02/18: TC 280, HDL 74, TG 175, LDL 171, nonHDL 206 (rosuvastatin 10 mg once weekly) 02/2018: TC 296, HDL 59, TG 177, LDL 201,  nonHDL 236 (no lipid lowering therapy)   Past Medical History:  Diagnosis Date  . Allergic rhinitis   . Atrial fibrillation (HCC) 08/22/2013  . Colon polyp 2008   on last colonoscopy with Dr. Loreta Ave in 2008  . Deviated nasal septum   . Diverticulosis 2008   on last colonoscopy with Dr. Loreta Ave in 2008  . Gastric ulcer with hemorrhage   . GERD (gastroesophageal reflux disease)   . Hypercholesteremia   . Hypertension 07/29/2018  . Iron deficiency anemia    After ulcer in 2013, resolved  . Osteoarthritis   . Vitamin B12  deficiency     Current Outpatient Medications on File Prior to Visit  Medication Sig Dispense Refill  . Evolocumab (REPATHA SURECLICK) 140 MG/ML SOAJ Inject 1 pen into the skin every 14 (fourteen) days. (Patient not taking: Reported on 10/13/2019) 2 pen 11  . ezetimibe (ZETIA) 10 MG tablet Take 1 tablet (10 mg total) by mouth daily. 90 tablet 3  . fexofenadine-pseudoephedrine (ALLEGRA-D) 60-120 MG per tablet Take 1 tablet by mouth daily as needed (for allergies). Patient is using this medication for allergies.    . fluticasone (FLONASE) 50 MCG/ACT nasal spray Place 2 sprays into the nose daily as needed for allergies.     Marland Kitchen lisinopril (PRINIVIL,ZESTRIL) 10 MG tablet Take 1 tablet (10 mg total) by mouth daily. 90 tablet 3   No current facility-administered medications on file prior to visit.    Allergies  Allergen Reactions  . Bactrim [Sulfamethoxazole-Trimethoprim] Other (See Comments)    Canker sores in her mouth.  . Adhesive [Tape] Rash and Other (See Comments)    Burns to the skin  . Latex Rash and Other (See Comments)    Burns to skin    Assessment/Plan: 1. Hyperlipidemia - LDL is very elevated and above goal of <100 mg/dL. Medication options are limited given extensive list of intolerances. Will restart rosuvastatin 10 mg once weekly. Will submit prior authorization for Nexlizet 180/10 mg once daily. Educated patient on the effect of diet on cholesterol levels and cardiovascular health, and patient will work on implementing a cardiac-healthy diet low in saturated fats. Encouraged patient to maintain her physical activity with water aerobics and pilates. Will schedule follow-up labs once prior authorization for Nexlizet is approved.  Tama Headings, PharmD PGY2 Cardiology Pharmacy Resident

## 2020-05-14 ENCOUNTER — Ambulatory Visit (INDEPENDENT_AMBULATORY_CARE_PROVIDER_SITE_OTHER): Payer: Medicare HMO | Admitting: Pharmacist

## 2020-05-14 ENCOUNTER — Other Ambulatory Visit: Payer: Self-pay

## 2020-05-14 ENCOUNTER — Encounter: Payer: Self-pay | Admitting: Pharmacist

## 2020-05-14 DIAGNOSIS — G720 Drug-induced myopathy: Secondary | ICD-10-CM | POA: Diagnosis not present

## 2020-05-14 DIAGNOSIS — E785 Hyperlipidemia, unspecified: Secondary | ICD-10-CM

## 2020-05-14 DIAGNOSIS — T466X5A Adverse effect of antihyperlipidemic and antiarteriosclerotic drugs, initial encounter: Secondary | ICD-10-CM

## 2020-05-14 HISTORY — DX: Drug-induced myopathy: T46.6X5A

## 2020-05-14 HISTORY — DX: Drug-induced myopathy: G72.0

## 2020-05-14 MED ORDER — ROSUVASTATIN CALCIUM 10 MG PO TABS: ORAL_TABLET | ORAL | 3 refills | Status: DC

## 2020-05-14 NOTE — Patient Instructions (Addendum)
It was nice to meet you today   Your LDL is 197 and your goal is < 100  START taking rosuvastatin 10 mg once weekly   I will submit information to your insurance to see if they will cover Nexlizet (bempedoic acid/ezetimibe). This medication is taken by mouth once daily and lowers your LDL by 40%.   We will plan to recheck your cholesterol 6-8 weeks after starting these medications  Please call us at [phone] if you have any questions

## 2020-05-15 ENCOUNTER — Telehealth: Payer: Self-pay | Admitting: Pharmacist

## 2020-05-15 DIAGNOSIS — E785 Hyperlipidemia, unspecified: Secondary | ICD-10-CM

## 2020-05-15 MED ORDER — NEXLIZET 180-10 MG PO TABS: 1.0000 | ORAL_TABLET | Freq: Every day | ORAL | 11 refills | Status: DC

## 2020-05-15 NOTE — Telephone Encounter (Addendum)
Called patient to follow-up from office visit yesterday. Prior authorization for Nexlizet (bempedoic acid/ezetimibe) approved through 07/20/21 (PA# [id number]). Sent in new Rx for Nexlizet 180/10 mg daily to patient's preferred pharmacy. Instructed patient to start Nexlizet one week after starting rosuvastatin given her extensive history of statin-induced myalgias that occurred immediately upon statin initiation. Patient verbalized understanding. Scheduled follow-up fasting labs for 07/09/20.

## 2020-05-15 NOTE — Addendum Note (Signed)
Addended byTama Headings on: 05/15/2020 04:02 PM   Modules accepted: Orders

## 2020-05-16 DIAGNOSIS — R0981 Nasal congestion: Secondary | ICD-10-CM | POA: Diagnosis not present

## 2020-05-16 DIAGNOSIS — Z03818 Encounter for observation for suspected exposure to other biological agents ruled out: Secondary | ICD-10-CM | POA: Diagnosis not present

## 2020-05-16 DIAGNOSIS — R519 Headache, unspecified: Secondary | ICD-10-CM | POA: Diagnosis not present

## 2020-05-16 MED ORDER — EZETIMIBE 10 MG PO TABS: 10.0000 mg | ORAL_TABLET | Freq: Every day | ORAL | 3 refills | Status: DC

## 2020-05-16 NOTE — Telephone Encounter (Signed)
Patient called back stating her Nexlizet was $250/month. Please call back to discuss alternatives.

## 2020-05-16 NOTE — Telephone Encounter (Signed)
Spoke with patient to discuss alternative options since copay of Nexlizet is too high. Would ideally like to start Praluent since PCSK9 inhibitor is the only option that will get patient's LDL to goal. However, she does not want to try another injection after she had bloody diarrhea when starting Repatha. Informed patient that we can increase her statin dose and start ezetimibe, but this will likely not adequately lower her LDL to goal. She would like to work on her diet/exercise and add ezetimibe 10 mg daily to rosuvastatin 10 mg once weekly. She took her first dose of rosuvastatin last night and is tolerating it well. Sent in new Rx for ezetimibe. Follow-up labs scheduled for 07/09/20.

## 2020-05-16 NOTE — Addendum Note (Signed)
Addended byTama Headings on: 05/16/2020 02:59 PM   Modules accepted: Orders

## 2020-05-17 ENCOUNTER — Ambulatory Visit
Admission: RE | Admit: 2020-05-17 | Discharge: 2020-05-17 | Disposition: A | Discharge status: Home / Self Care | Payer: Medicare HMO | Source: Ambulatory Visit | Attending: Family Medicine | Admitting: Family Medicine

## 2020-05-17 ENCOUNTER — Other Ambulatory Visit: Payer: Self-pay

## 2020-05-17 DIAGNOSIS — Z1231 Encounter for screening mammogram for malignant neoplasm of breast: Secondary | ICD-10-CM | POA: Diagnosis not present

## 2020-05-17 DIAGNOSIS — Z0000 Encounter for general adult medical examination without abnormal findings: Secondary | ICD-10-CM

## 2020-05-24 DIAGNOSIS — Z23 Encounter for immunization: Secondary | ICD-10-CM | POA: Diagnosis not present

## 2020-07-09 ENCOUNTER — Other Ambulatory Visit: Payer: Medicare HMO | Admitting: *Deleted

## 2020-07-09 ENCOUNTER — Other Ambulatory Visit: Payer: Self-pay

## 2020-07-09 DIAGNOSIS — E785 Hyperlipidemia, unspecified: Secondary | ICD-10-CM | POA: Diagnosis not present

## 2020-07-09 LAB — HEPATIC FUNCTION PANEL
ALT: 12 IU/L (ref 0–32)
AST: 19 IU/L (ref 0–40)
Albumin: 4.2 g/dL (ref 3.8–4.8)
Alkaline Phosphatase: 92 IU/L (ref 44–121)
Bilirubin Total: 0.5 mg/dL (ref 0.0–1.2)
Bilirubin, Direct: 0.1 mg/dL (ref 0.00–0.40)
Total Protein: 6.5 g/dL (ref 6.0–8.5)

## 2020-07-09 LAB — LIPID PANEL
Chol/HDL Ratio: 4.5 ratio — ABNORMAL HIGH (ref 0.0–4.4)
Cholesterol, Total: 241 mg/dL — ABNORMAL HIGH (ref 100–199)
HDL: 53 mg/dL (ref >39)
LDL Chol Calc (NIH): 148 mg/dL — ABNORMAL HIGH (ref 0–99)
Triglycerides: 221 mg/dL — ABNORMAL HIGH (ref 0–149)
VLDL Cholesterol Cal: 40 mg/dL (ref 5–40)

## 2020-07-10 ENCOUNTER — Telehealth: Payer: Self-pay | Admitting: Pharmacist

## 2020-07-10 MED ORDER — PRALUENT 75 MG/ML ~~LOC~~ SOAJ: 1.0000 "pen " | SUBCUTANEOUS | 11 refills | Status: DC

## 2020-07-10 NOTE — Telephone Encounter (Addendum)
Pt returned call to clinic. States 2 weeks ago she stopped both her rosuvastatin and ezetimibe because she was feeling bad (had aches, fatigue and stomach cramps). Felt better the next day. Sx had been gradually developing over the past 2 months.  She is agreeable to try Praluent 75mg . Prior has been submitted. She will also stay off her ezetimibe but try resuming once weekly rosuvastatin to determine which med was the cause of her prior symptoms.

## 2020-07-10 NOTE — Telephone Encounter (Signed)
Prior auth approved through 07/20/20. Rx sent to pharmacy, left message for pt, and faxed back insurance company asking them to approve Praluent for more than 10 days.

## 2020-07-10 NOTE — Telephone Encounter (Signed)
Called pt and left message to discuss lipid panel results.  LDL has improved from 197 to 148 since starting ezetimibe 10mg  daily and rosuvastatin 10mg  once weekly, but still remains above goal <100 (primary prevention but has family history of CAD). Will discuss increasing rosuvastatin frequency with pt if she's tolerating therapy. Unfortunately, she's previously intolerant to pravastatin 40mg  daily, simvastatin 20mg  daily, and Repatha 140mg . She did not wish to rechallenge with Praluent. Nexlizet copay was cost prohibitive, can try rerunning this in the 2022 year in case it's a lower Tier on her formulary next year.

## 2020-07-18 NOTE — Telephone Encounter (Signed)
Pt returned call. States she picked up her Praluent without issue. Plans to start it in the new year after the holidays and will let us know how she tolerates therapy. She is aware we will want to recheck her lipid panel in 2-3 months if she tolerates Praluent.

## 2020-07-18 NOTE — Telephone Encounter (Signed)
Left another message for pt alerting her of approval. Also called [employer] who states that authorization is now approved through 2022.

## 2020-09-05 DIAGNOSIS — H5213 Myopia, bilateral: Secondary | ICD-10-CM | POA: Diagnosis not present

## 2020-09-06 DIAGNOSIS — Z0100 Encounter for examination of eyes and vision without abnormal findings: Secondary | ICD-10-CM | POA: Diagnosis not present

## 2020-09-17 DIAGNOSIS — M5412 Radiculopathy, cervical region: Secondary | ICD-10-CM | POA: Diagnosis not present

## 2020-09-17 DIAGNOSIS — M25511 Pain in right shoulder: Secondary | ICD-10-CM | POA: Diagnosis not present

## 2020-10-29 DIAGNOSIS — M5030 Other cervical disc degeneration, unspecified cervical region: Secondary | ICD-10-CM | POA: Diagnosis not present

## 2020-10-29 DIAGNOSIS — M25511 Pain in right shoulder: Secondary | ICD-10-CM | POA: Diagnosis not present

## 2020-10-30 DIAGNOSIS — M810 Age-related osteoporosis without current pathological fracture: Secondary | ICD-10-CM | POA: Diagnosis not present

## 2020-11-29 DIAGNOSIS — M5412 Radiculopathy, cervical region: Secondary | ICD-10-CM | POA: Diagnosis not present

## 2020-12-03 DIAGNOSIS — M5412 Radiculopathy, cervical region: Secondary | ICD-10-CM | POA: Diagnosis not present

## 2020-12-13 DIAGNOSIS — M5412 Radiculopathy, cervical region: Secondary | ICD-10-CM | POA: Diagnosis not present

## 2020-12-27 DIAGNOSIS — S81812A Laceration without foreign body, left lower leg, initial encounter: Secondary | ICD-10-CM | POA: Diagnosis not present

## 2020-12-31 DIAGNOSIS — M5412 Radiculopathy, cervical region: Secondary | ICD-10-CM | POA: Diagnosis not present

## 2021-01-04 DIAGNOSIS — S81812D Laceration without foreign body, left lower leg, subsequent encounter: Secondary | ICD-10-CM | POA: Diagnosis not present

## 2021-01-30 DIAGNOSIS — M5412 Radiculopathy, cervical region: Secondary | ICD-10-CM | POA: Diagnosis not present

## 2021-02-16 DIAGNOSIS — U071 COVID-19: Secondary | ICD-10-CM | POA: Diagnosis not present

## 2021-03-03 DIAGNOSIS — M542 Cervicalgia: Secondary | ICD-10-CM | POA: Diagnosis not present

## 2021-03-06 DIAGNOSIS — H6983 Other specified disorders of Eustachian tube, bilateral: Secondary | ICD-10-CM | POA: Diagnosis not present

## 2021-03-14 DIAGNOSIS — M5412 Radiculopathy, cervical region: Secondary | ICD-10-CM | POA: Diagnosis not present

## 2021-04-12 DIAGNOSIS — Z0000 Encounter for general adult medical examination without abnormal findings: Secondary | ICD-10-CM | POA: Diagnosis not present

## 2021-04-12 DIAGNOSIS — E7800 Pure hypercholesterolemia, unspecified: Secondary | ICD-10-CM | POA: Diagnosis not present

## 2021-04-12 DIAGNOSIS — Z23 Encounter for immunization: Secondary | ICD-10-CM | POA: Diagnosis not present

## 2021-04-12 DIAGNOSIS — E538 Deficiency of other specified B group vitamins: Secondary | ICD-10-CM | POA: Diagnosis not present

## 2021-04-15 ENCOUNTER — Other Ambulatory Visit: Payer: Self-pay | Admitting: Family Medicine

## 2021-04-15 DIAGNOSIS — Z1231 Encounter for screening mammogram for malignant neoplasm of breast: Secondary | ICD-10-CM

## 2021-04-21 ENCOUNTER — Encounter (HOSPITAL_BASED_OUTPATIENT_CLINIC_OR_DEPARTMENT_OTHER): Payer: Self-pay | Admitting: Emergency Medicine

## 2021-04-21 ENCOUNTER — Emergency Department (HOSPITAL_BASED_OUTPATIENT_CLINIC_OR_DEPARTMENT_OTHER)
Admission: EM | Admit: 2021-04-21 | Discharge: 2021-04-21 | Disposition: A | Discharge status: Home / Self Care | Payer: Medicare HMO | Attending: Emergency Medicine | Admitting: Emergency Medicine

## 2021-04-21 ENCOUNTER — Encounter: Payer: Self-pay | Admitting: Oncology

## 2021-04-21 DIAGNOSIS — I10 Essential (primary) hypertension: Secondary | ICD-10-CM | POA: Insufficient documentation

## 2021-04-21 DIAGNOSIS — H539 Unspecified visual disturbance: Secondary | ICD-10-CM | POA: Diagnosis not present

## 2021-04-21 DIAGNOSIS — Z91040 Latex allergy status: Secondary | ICD-10-CM | POA: Insufficient documentation

## 2021-04-21 NOTE — ED Notes (Signed)
Patient reports seeing "lightning flashes" in peripheral vision of right eye earlier today, now resolved.  On the way to the hospital she had a sensation "like windshield wipers" going back and forth in her right eye, also now resolved.  Patient denies any vision changes or foreign body sensation in eye, denies any recent trauma.

## 2021-04-21 NOTE — ED Provider Notes (Signed)
[facility] EMERGENCY DEPARTMENT Provider Note   CSN: [id number] Arrival date & time: 04/21/21  1759     History Chief Complaint  Patient presents with   Eye Problem    Jenna [name] is a 65 y.o. female.  She is here with a complaint of some visual disturbance that started about 4 hours ago.  In her right eyes she saw some flashes of lightening in her peripheral vision.  She took out her contact lenses which improved it somewhat but not completely resolved.  No eye pain no eye trauma.  No prior history of same.  She sees Dr. Lorin Picket optometrist but does not have an ophthalmologist.  Currently wearing glasses and feels her vision is normal.  The history is provided by the patient.  Eye Problem Location:  Right eye Quality: lightning flashes. Onset quality:  Sudden Duration:  5 hours Timing:  Intermittent Progression:  Unchanged Chronicity:  New Relieved by:  None tried Worsened by:  Nothing Ineffective treatments:  None tried Associated symptoms: no blurred vision, no decreased vision, no discharge, no double vision, no facial rash, no itching, no nausea, no numbness, no photophobia, no redness, no tearing, no vomiting and no weakness       Past Medical History:  Diagnosis Date   Allergic rhinitis    Atrial fibrillation (HCC) 08/22/2013   Colon polyp 2008   on last colonoscopy with Dr. Loreta Ave in 2008   Deviated nasal septum    Diverticulosis 2008   on last colonoscopy with Dr. 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   Atrial fibrillation  (HCC) 08/22/2013   Gastric ulcer with hemorrhage 08/22/2013   Hypercholesteremia    Vitamin B12 deficiency    Microcytic anemia 02/26/2012   Hyperlipidemia 02/26/2012   Palpitations 02/26/2012    Past Surgical History:  Procedure Laterality Date   CESAREAN SECTION     ESOPHAGOGASTRODUODENOSCOPY N/A 08/10/2015   Procedure: ESOPHAGOGASTRODUODENOSCOPY (EGD);  Surgeon: Jeani Hawking, MD;  Location: [facility] ENDOSCOPY;  Service: Endoscopy;  Laterality: N/A;   HIATAL HERNIA REPAIR  08/15/2015   LAPAROSCOPIC NISSEN FUNDOPLICATION  08/15/2015   LAPAROSCOPIC NISSEN FUNDOPLICATION N/A 08/15/2015   Procedure: LAPAROSCOPIC HIATAL HERNIA REPAIR AND NISSEN FUNDOPLICATION;  Surgeon: Axel Filler, MD;  Location: [facility] OR;  Service: General;  Laterality: N/A;   NASAL SINUS SURGERY     TONSILLECTOMY       OB History     Gravida  3   Para  2   Term  2   Preterm      AB      Living  2      SAB      IAB      Ectopic      Multiple      Live Births              Family History  Problem Relation Age of Onset   Hypertension Mother    Hepatitis Mother    Heart attack Father    Hydrocephalus Father  Breast cancer Maternal Aunt 73    Social History   Tobacco Use   Smoking status: Never   Smokeless tobacco: Never  Vaping Use   Vaping Use: Never used  Substance Use Topics   Alcohol use: Yes    Comment: weekly   Drug use: No    Home Medications Prior to Admission medications   Medication Sig Start Date End Date Taking? Authorizing Provider  Alirocumab (PRALUENT) 75 MG/ML SOAJ Inject 1 pen into the skin every 14 (fourteen) days. 07/10/20   Corky Crafts, MD  fexofenadine-pseudoephedrine (ALLEGRA-D) 60-120 MG per tablet Take 1 tablet by mouth daily as needed (for allergies). Patient is using this medication for allergies.    [provider]  fluticasone (FLONASE) 50 MCG/ACT nasal spray Place 2 sprays into the nose daily as needed for allergies.     [provider]  rosuvastatin (CRESTOR) 10 MG tablet Take 1 tablet by mouth once weekly 05/14/20   Corky Crafts, MD    Allergies    Bactrim [sulfamethoxazole-trimethoprim], Ezetimibe, Statins, Adhesive [tape], and Latex  Review of Systems   Review of Systems  Constitutional:  Negative for fever.  HENT:  Negative for sore throat.   Eyes:  Positive for visual disturbance. Negative for blurred vision, double vision, photophobia, pain, discharge, redness and itching.  Respiratory:  Negative for shortness of breath.   Cardiovascular:  Negative for chest pain.  Gastrointestinal:  Negative for nausea and vomiting.  Genitourinary:  Negative for dysuria.  Musculoskeletal:  Negative for neck pain.  Skin:  Negative for rash.  Neurological:  Negative for speech difficulty, weakness and numbness.   Physical Exam Updated Vital Signs BP (!) 134/95   Pulse 92   Temp 98.3 F (36.8 C) (Oral)   Resp 16   Ht 5\' 3"  (1.6 m)   Wt 68 kg   SpO2 95%   BMI 26.57 kg/m   Physical Exam Vitals and nursing note reviewed.  Constitutional:      Appearance: Normal appearance. She is well-developed.  HENT:     Head: Normocephalic and atraumatic.  Eyes:     General: Lids are normal. Vision grossly intact. Gaze aligned appropriately.     Extraocular Movements: Extraocular movements intact.     Conjunctiva/sclera: Conjunctivae normal.     Pupils: Pupils are equal, round, and reactive to light.     Funduscopic exam:    Right eye: No hemorrhage or exudate.     Slit lamp exam:    Right eye: Anterior chamber quiet.     Visual Fields: Right eye visual fields normal.  Cardiovascular:     Rate and Rhythm: Normal rate and regular rhythm.     Pulses: Normal pulses.  Pulmonary:     Effort: Pulmonary effort is normal.     Breath sounds: Normal breath sounds.  Abdominal:     Tenderness: There is no abdominal tenderness. There is no guarding or rebound.  Musculoskeletal:        General: No deformity or  signs of injury. Normal range of motion.     Cervical back: Neck supple.  Skin:    General: Skin is warm and dry.  Neurological:     General: No focal deficit present.     Mental Status: She is alert and oriented to person, place, and time.     GCS: GCS eye subscore is 4. GCS verbal subscore is 5. GCS motor subscore is 6.     Cranial Nerves: No cranial  nerve deficit.     Sensory: No sensory deficit.     Motor: No weakness.    ED Results / Procedures / Treatments   Labs (all labs ordered are listed, but only abnormal results are displayed) Labs Reviewed - No data to display  EKG None  Radiology No results found.  Procedures Procedures   Medications Ordered in ED Medications - No data to display  ED Course  I have reviewed the triage vital signs and the nursing notes.  Pertinent labs & imaging results that were available during my care of the patient were reviewed by me and considered in my medical decision making (see chart for details).  Clinical Course as of 04/22/21 1617  Sun Apr 21, 2021  [id number] Discussed with ophthalmology on-call Dr. Vanessa Barbara.  He feels the patient can be seen tomorrow morning in the office and recommend she get there at 8:30 AM.  Will review with patient. [MB]    Clinical Course User Index [MB] Terrilee Files, MD   MDM Rules/Calculators/A&P                          65 year old female here with some visual flashers in her right eye.  Mildly hypertensive otherwise hemodynamically stable.  No definite evidence of abnormality on physical exam.  Differential includes retinal detachment, retinal tear, vitreous hemorrhage, hyphema.  Reviewed with Dr. Vanessa Barbara ophthalmology who recommends that the patient follow-up in the office tomorrow morning for further evaluation.  Patient comfortable with plan.  Return instructions discussed  Final Clinical Impression(s) / ED Diagnoses Final diagnoses:  Visual disturbance    Rx / DC Orders ED Discharge Orders      None        Terrilee Files, MD 04/22/21 1620

## 2021-04-21 NOTE — ED Triage Notes (Signed)
Pt reports seeing flashes in RT eye that started around 1500; on the way here she said it was like"windshield wipers"; also had floaters in LT eye this morning; both have subsided at this time; denies pain or change in vision

## 2021-04-21 NOTE — Discharge Instructions (Addendum)
Please see Dr. Vanessa Barbara at 830 tomorrow at [facility].  Will be expecting you.

## 2021-04-22 ENCOUNTER — Other Ambulatory Visit: Payer: Self-pay

## 2021-04-22 ENCOUNTER — Ambulatory Visit (INDEPENDENT_AMBULATORY_CARE_PROVIDER_SITE_OTHER): Payer: Medicare HMO | Admitting: Ophthalmology

## 2021-04-22 ENCOUNTER — Encounter: Payer: Self-pay | Admitting: Oncology

## 2021-04-22 DIAGNOSIS — H25813 Combined forms of age-related cataract, bilateral: Secondary | ICD-10-CM | POA: Diagnosis not present

## 2021-04-22 DIAGNOSIS — H3581 Retinal edema: Secondary | ICD-10-CM

## 2021-04-22 DIAGNOSIS — H43813 Vitreous degeneration, bilateral: Secondary | ICD-10-CM | POA: Diagnosis not present

## 2021-04-22 NOTE — Progress Notes (Signed)
[facility] - Clinic Note  04/22/2021     CHIEF COMPLAINT Patient presents for Eye Problem   HISTORY OF PRESENT ILLNESS: Jenna [name] is a 65 y.o. female who presents to the clinic today for:   HPI   Yesterday she noticed FOL's OD temporally.  Continued for about 2 hours.  Has improved this morning.  She went to the ER but they did not have the equipment to see anything, states patient.  Seeing floaters OU.  Vision appears stable.  The beginning of Sept.  She had the flashes that lasted a couple of days off and on.  It went away until yesterday.  LEE: 07/2020 with Dr. Ronney Asters  Last edited by Joni Reining, COA on 04/22/2021  8:53 AM.    Pt is here on ED follow up for fol, she states she started seeing the fol yesterday that last for about 2 hours, she noticed some when she woke up in the middle of the night as well, pt sees Dr. Lorin Picket for regular exams, pt has no hx of eye problems, she denies being diabetic or hypertensive  Referring physician: [facility] [address] [place],  [place] [address]  HISTORICAL INFORMATION:   Selected notes from the MEDICAL RECORD NUMBER Referred by ED from [facility]EE:  Ocular Hx- PMH-    CURRENT MEDICATIONS: No current outpatient medications on file. (Ophthalmic Drugs)   No current facility-administered medications for this visit. (Ophthalmic Drugs)   Current Outpatient Medications (Other)  Medication Sig   fexofenadine-pseudoephedrine (ALLEGRA-D) 60-120 MG per tablet Take 1 tablet by mouth daily as needed (for allergies). Patient is using this medication for allergies.   fluticasone (FLONASE) 50 MCG/ACT nasal spray Place 2 sprays into the nose daily as needed for allergies.    Alirocumab (PRALUENT) 75 MG/ML SOAJ Inject 1 pen into the skin every 14 (fourteen) days. (Patient not taking: Reported on 04/22/2021)   rosuvastatin (CRESTOR) 10 MG tablet Take 1 tablet by mouth once weekly  (Patient not taking: Reported on 04/22/2021)   No current facility-administered medications for this visit. (Other)   REVIEW OF SYSTEMS: ROS   Positive for: Gastrointestinal, Musculoskeletal, Cardiovascular, Eyes Negative for: Constitutional, Neurological, Skin, Genitourinary, HENT, Endocrine, Respiratory, Psychiatric, Allergic/Imm, Heme/Lymph Last edited by Joni Reining, COA on 04/22/2021  8:54 AM.     ALLERGIES Allergies  Allergen Reactions   Bactrim [Sulfamethoxazole-Trimethoprim] Other (See Comments)    Canker sores in her mouth.   Ezetimibe     Stomach cramping   Statins     Myalgias with pravastatin 40mg  daily, simvastatin 20mg  daily, rosuvastatin 10mg  twice weekly   Adhesive [Tape] Rash and Other (See Comments)    Burns to the skin   Latex Rash and Other (See Comments)    Burns to skin    PAST MEDICAL HISTORY Past Medical History:  Diagnosis Date   Allergic rhinitis    Atrial fibrillation (HCC) 08/22/2013   Colon polyp 2008   on last colonoscopy with Dr. in 2008   Deviated nasal septum    Diverticulosis 2008   on last colonoscopy with Dr. Loreta Ave in 2008   Gastric ulcer with hemorrhage    GERD (gastroesophageal reflux disease)    Hypercholesteremia    Hypertension 07/29/2018   Iron deficiency anemia    After ulcer in 2013, resolved   Osteoarthritis    Statin myopathy 05/14/2020   Vitamin B12 deficiency    Past Surgical History:  Procedure Laterality Date   CESAREAN SECTION     ESOPHAGOGASTRODUODENOSCOPY N/A 08/10/2015   Procedure: ESOPHAGOGASTRODUODENOSCOPY (EGD);  Surgeon: Jeani Hawking, MD;  [facility] ENDOSCOPY;  Service: Endoscopy;  Laterality: N/A;   HIATAL HERNIA REPAIR  08/15/2015   LAPAROSCOPIC NISSEN FUNDOPLICATION  08/15/2015   LAPAROSCOPIC NISSEN FUNDOPLICATION N/A 08/15/2015   Procedure: LAPAROSCOPIC HIATAL HERNIA REPAIR AND NISSEN FUNDOPLICATION;  Surgeon: Axel Filler, MD;  Location: [facility] OR;  Service: General;  Laterality: N/A;   NASAL SINUS  SURGERY     TONSILLECTOMY      FAMILY HISTORY Family History  Problem Relation Age of Onset   Hypertension Mother    Hepatitis Mother    Heart attack Father    Hydrocephalus Father    Breast cancer Maternal Aunt 74    SOCIAL HISTORY Social History   Tobacco Use   Smoking status: Never   Smokeless tobacco: Never  Vaping Use   Vaping Use: Never used  Substance Use Topics   Alcohol use: Yes    Comment: weekly   Drug use: No       OPHTHALMIC EXAM:  Base Eye Exam     Visual Acuity (Snellen - Linear)       Right Left   Dist cc 20/20 -2 20/25 +2   Dist ph cc  NI         Tonometry (Tonopen, 9:08 AM)       Right Left   Pressure 15 16         Pupils       Dark Light Shape React APD   Right 3 2 Round Brisk None   Left 3 2 Round Brisk None         Visual Fields (Counting fingers)       Left Right    Full Full         Extraocular Movement       Right Left    Full Full         Neuro/Psych     Oriented x3: Yes   Mood/Affect: Normal         Dilation     Both eyes: 1.0% Mydriacyl, 2.5% Phenylephrine @ 9:08 AM           Slit Lamp and Fundus Exam     External Exam       Right Left   External Normal Normal         Slit Lamp Exam       Right Left   Lids/Lashes Dermatochalasis - upper lid, mild MGD Dermatochalasis - upper lid, mild MGD   Conjunctiva/Sclera White and quiet White and quiet   Cornea mild arcus mild arcus, trace PEE   Anterior Chamber Deep and quiet Deep and quiet   Iris Round and dilated Round and dilated   Lens 2+ Nuclear sclerosis, 2+ Cortical cataract 2+ Nuclear sclerosis, 2+ Cortical cataract   Vitreous Vitreous syneresis, no pigment, Posterior vitreous detachment, Weiss ring Vitreous syneresis, no pigment, Posterior vitreous detachment         Fundus Exam       Right Left   Disc Pink and Sharp, Compact, temporal PPA Pink and Sharp, Compact, temporal PPA, tilted   C/D Ratio 0.1 0.1   Macula Flat,  Blunted foveal reflex, RPE mottling, No heme or edema Flat, Blunted foveal reflex, RPE mottling, No heme or edema   Vessels mild attenuation, mild tortuousity mild attenuation, mild tortuousity   Periphery Attached, mild peripheral cystoid degeneration,  No RT/RD on 360 scleral depression  Attached, pigmented cystoid degeneration greatest inferiorly           Refraction     Wearing Rx       Sphere Cylinder Axis Add   Right -9.25 +2.00 160 +2.75   Left -7.00 +1.00 164 +2.75         Manifest Refraction       Sphere Cylinder Axis Dist VA   Right       Left -7.50 +1.00 164 20/20            IMAGING AND PROCEDURES  Imaging and Procedures for 04/22/2021  OCT, Retina - OU - Both Eyes       Right Eye Quality was good. Central Foveal Thickness: 298. Progression has no prior data. Findings include normal foveal contour, no IRF, no SRF.   Left Eye Quality was good. Central Foveal Thickness: 266. Progression has no prior data. Findings include normal foveal contour, no IRF, no SRF.   Notes *Images captured and stored on drive  Diagnosis / Impression:  NFP, no IRF/SRF OU  Clinical management:  See below  Abbreviations: NFP - Normal foveal profile. CME - cystoid macular edema. PED - pigment epithelial detachment. IRF - intraretinal fluid. SRF - subretinal fluid. EZ - ellipsoid zone. ERM - epiretinal membrane. ORA - outer retinal atrophy. ORT - outer retinal tubulation. SRHM - subretinal hyper-reflective material. IRHM - intraretinal hyper-reflective material              ASSESSMENT/PLAN:    ICD-10-CM   1. Posterior vitreous detachment of both eyes  H43.813     2. Retinal edema  H35.81 OCT, Retina - OU - Both Eyes    3. Combined forms of age-related cataract of both eyes  H25.813     1,2. PVD / vitreous syneresis OU  - OD with symptomatic flashes and floaters x1 day  - Discussed findings and prognosis  - No RT or RD on 360 scleral depressed exam  - Reviewed  s/s of RT/RD  - Strict return precautions for any such RT/RD signs/symptoms  - f/u in 4-6 wks -- DFE/OCT  3. Mixed Cataract OU - The symptoms of cataract, surgical options, and treatments and risks were discussed with patient. - discussed diagnosis and progression - not yet visually significant - monitor for now  Ophthalmic Meds Ordered this visit:  No orders of the defined types were placed in this encounter.    Return for f/u 4-6 weeks, PVD OU, DFE, OCT.  There are no Patient Instructions on file for this visit.   Explained the diagnoses, plan, and follow up with the patient and they expressed understanding.  Patient expressed understanding of the importance of proper follow up care.   This document serves as a record of services personally performed by Karie Chimera, MD, PhD. It was created on their behalf by Joni Reining, an ophthalmic technician. The creation of this record is the provider's dictation and/or activities during the visit.    Electronically signed by: Joni Reining COA, 04/23/21  11:20 PM'  This document serves as a record of services personally performed by Karie Chimera, MD, PhD. It was created on their behalf by Glee Arvin. Manson Passey, OA an ophthalmic technician. The creation of this record is the provider's dictation and/or activities during the visit.    Electronically signed by: Glee Arvin. Manson Passey, [place] 10.03.2022 11:20 PM  Karie Chimera, M.D., Ph.D. Diseases & Surgery of the Retina and  Vitreous [facility] 04/22/2021  I have reviewed the above documentation for accuracy and completeness, and I agree with the above. Karie Chimera, M.D., Ph.D. 04/23/21 11:20 PM  Abbreviations: M myopia (nearsighted); A astigmatism; H hyperopia (farsighted); P presbyopia; Mrx spectacle prescription;  CTL contact lenses; OD right eye; OS left eye; OU both eyes  XT exotropia; ET esotropia; PEK punctate epithelial keratitis; PEE punctate epithelial erosions;  DES dry eye syndrome; MGD meibomian gland dysfunction; ATs artificial tears; PFAT's preservative free artificial tears; NSC nuclear sclerotic cataract; PSC posterior subcapsular cataract; ERM epi-retinal membrane; PVD posterior vitreous detachment; RD retinal detachment; DM diabetes mellitus; DR diabetic retinopathy; NPDR non-proliferative diabetic retinopathy; PDR proliferative diabetic retinopathy; CSME clinically significant macular edema; DME diabetic macular edema; dbh dot blot hemorrhages; CWS cotton wool spot; POAG primary open angle glaucoma; C/D cup-to-disc ratio; HVF humphrey visual field; GVF goldmann visual field; OCT optical coherence tomography; IOP intraocular pressure; BRVO Branch retinal vein occlusion; CRVO central retinal vein occlusion; CRAO central retinal artery occlusion; BRAO branch retinal artery occlusion; RT retinal tear; SB scleral buckle; PPV pars plana vitrectomy; VH Vitreous hemorrhage; PRP panretinal laser photocoagulation; IVK intravitreal kenalog; VMT vitreomacular traction; MH Macular hole;  NVD neovascularization of the disc; NVE neovascularization elsewhere; AREDS age related eye disease study; ARMD age related macular degeneration; POAG primary open angle glaucoma; EBMD epithelial/anterior basement membrane dystrophy; ACIOL anterior chamber intraocular lens; IOL intraocular lens; PCIOL posterior chamber intraocular lens; Phaco/IOL phacoemulsification with intraocular lens placement; PRK photorefractive keratectomy; LASIK laser assisted in situ keratomileusis; HTN hypertension; DM diabetes mellitus; COPD chronic obstructive pulmonary disease

## 2021-04-23 ENCOUNTER — Encounter (INDEPENDENT_AMBULATORY_CARE_PROVIDER_SITE_OTHER): Payer: Self-pay | Admitting: Ophthalmology

## 2021-04-30 DIAGNOSIS — H6502 Acute serous otitis media, left ear: Secondary | ICD-10-CM | POA: Diagnosis not present

## 2021-05-02 DIAGNOSIS — M810 Age-related osteoporosis without current pathological fracture: Secondary | ICD-10-CM | POA: Diagnosis not present

## 2021-05-20 ENCOUNTER — Ambulatory Visit
Admission: RE | Admit: 2021-05-20 | Discharge: 2021-05-20 | Disposition: A | Discharge status: Home / Self Care | Payer: Medicare HMO | Source: Ambulatory Visit | Attending: Family Medicine | Admitting: Family Medicine

## 2021-05-20 ENCOUNTER — Other Ambulatory Visit: Payer: Self-pay

## 2021-05-20 DIAGNOSIS — Z1231 Encounter for screening mammogram for malignant neoplasm of breast: Secondary | ICD-10-CM

## 2021-05-20 DIAGNOSIS — J019 Acute sinusitis, unspecified: Secondary | ICD-10-CM | POA: Diagnosis not present

## 2021-05-20 DIAGNOSIS — H6502 Acute serous otitis media, left ear: Secondary | ICD-10-CM | POA: Diagnosis not present

## 2021-05-23 NOTE — Progress Notes (Signed)
[facility] - Clinic Note  05/27/2021     CHIEF COMPLAINT Patient presents for Retina Follow Up   HISTORY OF PRESENT ILLNESS: Jenna [name] is a 66 y.o. female who presents to the clinic today for:   HPI     Retina Follow Up   Patient presents with  PVD.  In both eyes.  This started 5 weeks ago.  Severity is mild.  Duration of 5 weeks.  Since onset it is stable.  I, the attending physician,  performed the HPI with the patient and updated documentation appropriately.        Comments   65 y/o female pt here for 5 wk f/u for PVD OU.  No change in [place] OU.  Denies pain, FOL, floaters.  No gtts.      Last edited by Rennis Chris, MD on 05/27/2021 10:50 PM.     Pt states vision is stable  Referring physician: Fredrich Birks, OD [address] [place],  [place] [address]  HISTORICAL INFORMATION:   Selected notes from the MEDICAL RECORD NUMBER Referred by ED from [facility] LEE:  Ocular Hx- PMH-    CURRENT MEDICATIONS: No current outpatient medications on file. (Ophthalmic Drugs)   No current facility-administered medications for this visit. (Ophthalmic Drugs)   Current Outpatient Medications (Other)  Medication Sig   Alirocumab (PRALUENT) 75 MG/ML SOAJ Inject 1 pen into the skin every 14 (fourteen) days.   butalbital-aspirin-caffeine (FIORINAL) 50-325-40 MG capsule 1 capsule   fexofenadine-pseudoephedrine (ALLEGRA-D) 60-120 MG per tablet Take 1 tablet by mouth daily as needed (for allergies). Patient is using this medication for allergies.   fluticasone (FLONASE) 50 MCG/ACT nasal spray Place 2 sprays into the nose daily as needed for allergies.    fluticasone (FLONASE) 50 MCG/ACT nasal spray 1 puff in each nostril   methocarbamol (ROBAXIN) 500 MG tablet methocarbamol 500 mg tablet  Take 2 tablets 4 times a day by oral route.   predniSONE (DELTASONE) 10 MG tablet prednisone 10 mg tablet   rosuvastatin (CRESTOR) 10 MG tablet Take 1 tablet by mouth  once weekly   No current facility-administered medications for this visit. (Other)   REVIEW OF SYSTEMS: ROS   Positive for: Gastrointestinal, Musculoskeletal, Cardiovascular, Eyes Negative for: Constitutional, Neurological, Skin, Genitourinary, HENT, Endocrine, Respiratory, Psychiatric, Allergic/Imm, Heme/Lymph Last edited by Celine Mans, COA on 05/27/2021  9:45 AM.      ALLERGIES Allergies  Allergen Reactions   Bactrim [Sulfamethoxazole-Trimethoprim] Other (See Comments)    Canker sores in her mouth.   Ezetimibe     Stomach cramping   Statins     Myalgias with pravastatin 40mg  daily, simvastatin 20mg  daily, rosuvastatin 10mg  twice weekly   Adhesive [Tape] Rash and Other (See Comments)    Burns to the skin   Latex Rash and Other (See Comments)    Burns to skin    PAST MEDICAL HISTORY Past Medical History:  Diagnosis Date   Allergic rhinitis    Atrial fibrillation (HCC) 08/22/2013   Cataract    Colon polyp 07/21/2006   on last colonoscopy with Dr. in 2008   Deviated nasal septum    Diverticulosis 07/21/2006   on last colonoscopy with Dr. Loreta Ave in 2008   Gastric ulcer with hemorrhage    GERD (gastroesophageal reflux disease)    Hypercholesteremia    Hypertension 07/29/2018   Iron deficiency anemia    After ulcer in 2013, resolved   Osteoarthritis    Statin myopathy 05/14/2020  Vitamin B12 deficiency    Past Surgical History:  Procedure Laterality Date   CESAREAN SECTION     ESOPHAGOGASTRODUODENOSCOPY N/A 08/10/2015   Procedure: ESOPHAGOGASTRODUODENOSCOPY (EGD);  Surgeon: Jeani Hawking, MD;  Location: [facility] ENDOSCOPY;  Service: Endoscopy;  Laterality: N/A;   HIATAL HERNIA REPAIR  08/15/2015   LAPAROSCOPIC NISSEN FUNDOPLICATION  08/15/2015   LAPAROSCOPIC NISSEN FUNDOPLICATION N/A 08/15/2015   Procedure: LAPAROSCOPIC HIATAL HERNIA REPAIR AND NISSEN FUNDOPLICATION;  Surgeon: Axel Filler, MD;  Location: [facility] OR;  Service: General;  Laterality: N/A;   NASAL SINUS  SURGERY     TONSILLECTOMY      FAMILY HISTORY Family History  Problem Relation Age of Onset   Hypertension Mother    Hepatitis Mother    Heart attack Father    Hydrocephalus Father    Breast cancer Maternal Aunt 90    SOCIAL HISTORY Social History   Tobacco Use   Smoking status: Never   Smokeless tobacco: Never  Vaping Use   Vaping Use: Never used  Substance Use Topics   Alcohol use: Yes    Comment: weekly   Drug use: No       OPHTHALMIC EXAM:  Base Eye Exam     Visual Acuity (Snellen - Linear)       Right Left   Dist cc 20/20 -2 20/20 -2    Correction: Contacts         Tonometry (Tonopen, 9:50 AM)       Right Left   Pressure 11 13         Pupils       Dark Light Shape React APD   Right 3 2 Round Brisk None   Left 3 2 Round Brisk None         Visual Fields (Counting fingers)       Left Right    Full Full         Extraocular Movement       Right Left    Full, Ortho Full, Ortho         Neuro/Psych     Oriented x3: Yes   Mood/Affect: Normal         Dilation     Both eyes: 1.0% Mydriacyl, 2.5% Phenylephrine @ 9:50 AM           Slit Lamp and Fundus Exam     External Exam       Right Left   External Normal Normal         Slit Lamp Exam       Right Left   Lids/Lashes Dermatochalasis - upper lid, mild MGD Dermatochalasis - upper lid, mild MGD   Conjunctiva/Sclera White and quiet White and quiet   Cornea mild arcus mild arcus, trace PEE   Anterior Chamber Deep and quiet Deep and quiet   Iris Round and dilated Round and dilated   Lens 2+ Nuclear sclerosis, 2+ Cortical cataract 2+ Nuclear sclerosis, 2+ Cortical cataract   Vitreous Vitreous syneresis, no pigment, Posterior vitreous detachment, Weiss ring Vitreous syneresis, no pigment, Posterior vitreous detachment         Fundus Exam       Right Left   Disc Pink and Sharp, Compact, temporal PPA, mild tilt Pink and Sharp, Compact, temporal PPA, tilted   C/D  Ratio 0.1 0.1   Macula Flat, Blunted foveal reflex, RPE mottling, No heme or edema Flat, Blunted foveal reflex, RPE mottling, No heme or edema   Vessels mild attenuation, mild tortuousity  mild attenuation, mild tortuousity   Periphery Attached, mild peripheral cystoid degeneration, No RT/RD Attached, pigmented cystoid degeneration greatest inferiorly            IMAGING AND PROCEDURES  Imaging and Procedures for 05/27/2021  OCT, Retina - OU - Both Eyes       Right Eye Quality was good. Central Foveal Thickness: 298. Progression has been stable. Findings include normal foveal contour, no IRF, no SRF.   Left Eye Quality was good. Central Foveal Thickness: 274. Progression has been stable. Findings include normal foveal contour, no IRF, no SRF.   Notes *Images captured and stored on drive  Diagnosis / Impression:  NFP, no IRF/SRF OU  Clinical management:  See below  Abbreviations: NFP - Normal foveal profile. CME - cystoid macular edema. PED - pigment epithelial detachment. IRF - intraretinal fluid. SRF - subretinal fluid. EZ - ellipsoid zone. ERM - epiretinal membrane. ORA - outer retinal atrophy. ORT - outer retinal tubulation. SRHM - subretinal hyper-reflective material. IRHM - intraretinal hyper-reflective material            ASSESSMENT/PLAN:    ICD-10-CM   1. Posterior vitreous detachment of both eyes  H43.813     2. Retinal edema  H35.81 OCT, Retina - OU - Both Eyes    3. Combined forms of age-related cataract of both eyes  H25.813      1,2. PVD / vitreous syneresis OU  - OD with symptomatic flashes and floaters improved  - Discussed findings and prognosis  - No RT or RD on 360 scleral depressed exam  - Reviewed s/s of RT/RD  - pt is cleared from a retina standpoint for release to Dr. Fredrich Birks and resumption of primary eye care  3. Mixed Cataract OU - The symptoms of cataract, surgical options, and treatments and risks were discussed with patient. -  discussed diagnosis and progression - not yet visually significant - monitor for now  Ophthalmic Meds Ordered this visit:  No orders of the defined types were placed in this encounter.    Return if symptoms worsen or fail to improve.  There are no Patient Instructions on file for this visit.  This document serves as a record of services personally performed by Karie Chimera, MD, PhD. It was created on their behalf by Herby Abraham, COA, an ophthalmic technician. The creation of this record is the provider's dictation and/or activities during the visit.    Electronically signed by: Herby Abraham, COA @TODAY @ 10:52 PM  This document serves as a record of services personally performed by , MD, PhD. It was created on their behalf by Karie Chimera. Glee Arvin, OA an ophthalmic technician. The creation of this record is the provider's dictation and/or activities during the visit.    Electronically signed by: Manson Passey. Glee Arvin 11.07.2022 10:52 PM  13.07.2022, M.D., Ph.D. Diseases & Surgery of the Retina and Vitreous [facility] 05/27/2021   I have reviewed the above documentation for accuracy and completeness, and I agree with the above. 13/01/2021, M.D., Ph.D. 05/27/21 10:52 PM   Abbreviations: M myopia (nearsighted); A astigmatism; H hyperopia (farsighted); P presbyopia; Mrx spectacle prescription;  CTL contact lenses; OD right eye; OS left eye; OU both eyes  XT exotropia; ET esotropia; PEK punctate epithelial keratitis; PEE punctate epithelial erosions; DES dry eye syndrome; MGD meibomian gland dysfunction; ATs artificial tears; PFAT's preservative free artificial tears; NSC nuclear sclerotic cataract; PSC posterior subcapsular cataract;  ERM epi-retinal membrane; PVD posterior vitreous detachment; RD retinal detachment; DM diabetes mellitus; DR diabetic retinopathy; NPDR non-proliferative diabetic retinopathy; PDR proliferative diabetic  retinopathy; CSME clinically significant macular edema; DME diabetic macular edema; dbh dot blot hemorrhages; CWS cotton wool spot; POAG primary open angle glaucoma; C/D cup-to-disc ratio; HVF humphrey visual field; GVF goldmann visual field; OCT optical coherence tomography; IOP intraocular pressure; BRVO Branch retinal vein occlusion; CRVO central retinal vein occlusion; CRAO central retinal artery occlusion; BRAO branch retinal artery occlusion; RT retinal tear; SB scleral buckle; PPV pars plana vitrectomy; VH Vitreous hemorrhage; PRP panretinal laser photocoagulation; IVK intravitreal kenalog; VMT vitreomacular traction; MH Macular hole;  NVD neovascularization of the disc; NVE neovascularization elsewhere; AREDS age related eye disease study; ARMD age related macular degeneration; POAG primary open angle glaucoma; EBMD epithelial/anterior basement membrane dystrophy; ACIOL anterior chamber intraocular lens; IOL intraocular lens; PCIOL posterior chamber intraocular lens; Phaco/IOL phacoemulsification with intraocular lens placement; PRK photorefractive keratectomy; LASIK laser assisted in situ keratomileusis; HTN hypertension; DM diabetes mellitus; COPD chronic obstructive pulmonary disease

## 2021-05-27 ENCOUNTER — Other Ambulatory Visit: Payer: Self-pay

## 2021-05-27 ENCOUNTER — Encounter (INDEPENDENT_AMBULATORY_CARE_PROVIDER_SITE_OTHER): Payer: Self-pay | Admitting: Ophthalmology

## 2021-05-27 ENCOUNTER — Ambulatory Visit (INDEPENDENT_AMBULATORY_CARE_PROVIDER_SITE_OTHER): Payer: Medicare HMO | Admitting: Ophthalmology

## 2021-05-27 DIAGNOSIS — H25813 Combined forms of age-related cataract, bilateral: Secondary | ICD-10-CM

## 2021-05-27 DIAGNOSIS — H43813 Vitreous degeneration, bilateral: Secondary | ICD-10-CM

## 2021-05-27 DIAGNOSIS — H3581 Retinal edema: Secondary | ICD-10-CM

## 2021-07-01 DIAGNOSIS — M170 Bilateral primary osteoarthritis of knee: Secondary | ICD-10-CM | POA: Diagnosis not present

## 2021-07-23 DIAGNOSIS — H903 Sensorineural hearing loss, bilateral: Secondary | ICD-10-CM | POA: Diagnosis not present

## 2021-07-23 DIAGNOSIS — H9313 Tinnitus, bilateral: Secondary | ICD-10-CM | POA: Diagnosis not present

## 2021-08-22 DIAGNOSIS — M6740 Ganglion, unspecified site: Secondary | ICD-10-CM | POA: Diagnosis not present

## 2021-08-22 DIAGNOSIS — L570 Actinic keratosis: Secondary | ICD-10-CM | POA: Diagnosis not present

## 2021-08-22 DIAGNOSIS — G4700 Insomnia, unspecified: Secondary | ICD-10-CM | POA: Diagnosis not present

## 2021-08-22 DIAGNOSIS — J019 Acute sinusitis, unspecified: Secondary | ICD-10-CM | POA: Diagnosis not present

## 2021-09-09 DIAGNOSIS — D1801 Hemangioma of skin and subcutaneous tissue: Secondary | ICD-10-CM | POA: Diagnosis not present

## 2021-09-09 DIAGNOSIS — D225 Melanocytic nevi of trunk: Secondary | ICD-10-CM | POA: Diagnosis not present

## 2021-09-09 DIAGNOSIS — L820 Inflamed seborrheic keratosis: Secondary | ICD-10-CM | POA: Diagnosis not present

## 2021-09-30 DIAGNOSIS — H5213 Myopia, bilateral: Secondary | ICD-10-CM | POA: Diagnosis not present

## 2021-10-07 DIAGNOSIS — H5213 Myopia, bilateral: Secondary | ICD-10-CM | POA: Diagnosis not present

## 2021-10-07 DIAGNOSIS — H524 Presbyopia: Secondary | ICD-10-CM | POA: Diagnosis not present

## 2021-10-09 DIAGNOSIS — M25561 Pain in right knee: Secondary | ICD-10-CM | POA: Diagnosis not present

## 2021-10-09 DIAGNOSIS — M170 Bilateral primary osteoarthritis of knee: Secondary | ICD-10-CM | POA: Diagnosis not present

## 2021-10-09 DIAGNOSIS — M25562 Pain in left knee: Secondary | ICD-10-CM | POA: Diagnosis not present

## 2021-11-01 DIAGNOSIS — J019 Acute sinusitis, unspecified: Secondary | ICD-10-CM | POA: Diagnosis not present

## 2021-11-01 DIAGNOSIS — K219 Gastro-esophageal reflux disease without esophagitis: Secondary | ICD-10-CM | POA: Diagnosis not present

## 2021-11-01 DIAGNOSIS — R0981 Nasal congestion: Secondary | ICD-10-CM | POA: Diagnosis not present

## 2021-11-01 DIAGNOSIS — M810 Age-related osteoporosis without current pathological fracture: Secondary | ICD-10-CM | POA: Diagnosis not present

## 2021-11-19 DIAGNOSIS — J329 Chronic sinusitis, unspecified: Secondary | ICD-10-CM | POA: Diagnosis not present

## 2021-11-19 DIAGNOSIS — J019 Acute sinusitis, unspecified: Secondary | ICD-10-CM | POA: Diagnosis not present

## 2022-01-10 DIAGNOSIS — H9202 Otalgia, left ear: Secondary | ICD-10-CM | POA: Diagnosis not present

## 2022-01-10 DIAGNOSIS — J320 Chronic maxillary sinusitis: Secondary | ICD-10-CM | POA: Diagnosis not present

## 2022-01-28 DIAGNOSIS — J309 Allergic rhinitis, unspecified: Secondary | ICD-10-CM | POA: Diagnosis not present

## 2022-01-28 DIAGNOSIS — H903 Sensorineural hearing loss, bilateral: Secondary | ICD-10-CM | POA: Diagnosis not present

## 2022-01-28 DIAGNOSIS — Z9889 Other specified postprocedural states: Secondary | ICD-10-CM | POA: Diagnosis not present

## 2022-01-28 DIAGNOSIS — H6982 Other specified disorders of Eustachian tube, left ear: Secondary | ICD-10-CM | POA: Diagnosis not present

## 2022-02-10 DIAGNOSIS — M170 Bilateral primary osteoarthritis of knee: Secondary | ICD-10-CM | POA: Diagnosis not present

## 2022-02-10 DIAGNOSIS — L255 Unspecified contact dermatitis due to plants, except food: Secondary | ICD-10-CM | POA: Diagnosis not present

## 2022-04-03 DIAGNOSIS — Z1211 Encounter for screening for malignant neoplasm of colon: Secondary | ICD-10-CM | POA: Diagnosis not present

## 2022-04-03 DIAGNOSIS — K5730 Diverticulosis of large intestine without perforation or abscess without bleeding: Secondary | ICD-10-CM | POA: Diagnosis not present

## 2022-04-03 DIAGNOSIS — Z86010 Personal history of colonic polyps: Secondary | ICD-10-CM | POA: Diagnosis not present

## 2022-04-07 DIAGNOSIS — Z86010 Personal history of colonic polyps: Secondary | ICD-10-CM | POA: Diagnosis not present

## 2022-04-07 DIAGNOSIS — K5730 Diverticulosis of large intestine without perforation or abscess without bleeding: Secondary | ICD-10-CM | POA: Diagnosis not present

## 2022-04-07 DIAGNOSIS — Z1211 Encounter for screening for malignant neoplasm of colon: Secondary | ICD-10-CM | POA: Diagnosis not present

## 2022-04-17 ENCOUNTER — Other Ambulatory Visit: Payer: Self-pay | Admitting: Family Medicine

## 2022-04-17 DIAGNOSIS — Z1231 Encounter for screening mammogram for malignant neoplasm of breast: Secondary | ICD-10-CM

## 2022-04-23 DIAGNOSIS — H9202 Otalgia, left ear: Secondary | ICD-10-CM | POA: Diagnosis not present

## 2022-04-23 DIAGNOSIS — J320 Chronic maxillary sinusitis: Secondary | ICD-10-CM | POA: Diagnosis not present

## 2022-05-12 DIAGNOSIS — E782 Mixed hyperlipidemia: Secondary | ICD-10-CM | POA: Diagnosis not present

## 2022-05-12 DIAGNOSIS — I10 Essential (primary) hypertension: Secondary | ICD-10-CM | POA: Diagnosis not present

## 2022-05-12 DIAGNOSIS — M810 Age-related osteoporosis without current pathological fracture: Secondary | ICD-10-CM | POA: Diagnosis not present

## 2022-05-12 DIAGNOSIS — I480 Paroxysmal atrial fibrillation: Secondary | ICD-10-CM | POA: Diagnosis not present

## 2022-05-12 DIAGNOSIS — Z0000 Encounter for general adult medical examination without abnormal findings: Secondary | ICD-10-CM | POA: Diagnosis not present

## 2022-05-12 DIAGNOSIS — E538 Deficiency of other specified B group vitamins: Secondary | ICD-10-CM | POA: Diagnosis not present

## 2022-05-13 ENCOUNTER — Other Ambulatory Visit: Payer: Self-pay | Admitting: Family Medicine

## 2022-05-13 DIAGNOSIS — M810 Age-related osteoporosis without current pathological fracture: Secondary | ICD-10-CM

## 2022-05-15 DIAGNOSIS — L659 Nonscarring hair loss, unspecified: Secondary | ICD-10-CM | POA: Diagnosis not present

## 2022-05-19 ENCOUNTER — Encounter: Payer: Self-pay | Admitting: Oncology

## 2022-05-21 DIAGNOSIS — M170 Bilateral primary osteoarthritis of knee: Secondary | ICD-10-CM | POA: Diagnosis not present

## 2022-05-26 ENCOUNTER — Ambulatory Visit
Admission: RE | Admit: 2022-05-26 | Discharge: 2022-05-26 | Disposition: A | Discharge status: Home / Self Care | Payer: Medicare Other | Source: Ambulatory Visit | Attending: Family Medicine | Admitting: Family Medicine

## 2022-05-26 DIAGNOSIS — Z1231 Encounter for screening mammogram for malignant neoplasm of breast: Secondary | ICD-10-CM

## 2022-06-02 ENCOUNTER — Ambulatory Visit (HOSPITAL_BASED_OUTPATIENT_CLINIC_OR_DEPARTMENT_OTHER)
Admission: RE | Admit: 2022-06-02 | Discharge: 2022-06-02 | Disposition: A | Discharge status: Home / Self Care | Payer: Medicare Other | Source: Ambulatory Visit | Attending: Family Medicine | Admitting: Family Medicine

## 2022-06-02 DIAGNOSIS — M810 Age-related osteoporosis without current pathological fracture: Secondary | ICD-10-CM | POA: Diagnosis not present

## 2022-06-25 DIAGNOSIS — B3781 Candidal esophagitis: Secondary | ICD-10-CM | POA: Diagnosis not present

## 2022-06-25 DIAGNOSIS — Z9889 Other specified postprocedural states: Secondary | ICD-10-CM | POA: Diagnosis not present

## 2022-06-25 DIAGNOSIS — K2950 Unspecified chronic gastritis without bleeding: Secondary | ICD-10-CM | POA: Diagnosis not present

## 2022-06-25 DIAGNOSIS — K2289 Other specified disease of esophagus: Secondary | ICD-10-CM | POA: Diagnosis not present

## 2022-06-25 DIAGNOSIS — Z86010 Personal history of colonic polyps: Secondary | ICD-10-CM | POA: Diagnosis not present

## 2022-06-25 DIAGNOSIS — K219 Gastro-esophageal reflux disease without esophagitis: Secondary | ICD-10-CM | POA: Diagnosis not present

## 2022-06-25 DIAGNOSIS — K5730 Diverticulosis of large intestine without perforation or abscess without bleeding: Secondary | ICD-10-CM | POA: Diagnosis not present

## 2022-06-25 DIAGNOSIS — Z1211 Encounter for screening for malignant neoplasm of colon: Secondary | ICD-10-CM | POA: Diagnosis not present

## 2022-06-25 DIAGNOSIS — R1310 Dysphagia, unspecified: Secondary | ICD-10-CM | POA: Diagnosis not present

## 2022-07-01 DIAGNOSIS — T148XXA Other injury of unspecified body region, initial encounter: Secondary | ICD-10-CM | POA: Diagnosis not present

## 2022-07-01 DIAGNOSIS — M25471 Effusion, right ankle: Secondary | ICD-10-CM | POA: Diagnosis not present

## 2022-07-22 DIAGNOSIS — J019 Acute sinusitis, unspecified: Secondary | ICD-10-CM | POA: Diagnosis not present

## 2022-08-11 DIAGNOSIS — S81801A Unspecified open wound, right lower leg, initial encounter: Secondary | ICD-10-CM | POA: Diagnosis not present

## 2022-08-19 DIAGNOSIS — S01512A Laceration without foreign body of oral cavity, initial encounter: Secondary | ICD-10-CM | POA: Diagnosis not present

## 2022-09-01 DIAGNOSIS — M170 Bilateral primary osteoarthritis of knee: Secondary | ICD-10-CM | POA: Diagnosis not present

## 2022-09-02 DIAGNOSIS — J020 Streptococcal pharyngitis: Secondary | ICD-10-CM | POA: Diagnosis not present

## 2022-09-02 DIAGNOSIS — R0981 Nasal congestion: Secondary | ICD-10-CM | POA: Diagnosis not present

## 2022-09-02 DIAGNOSIS — U071 COVID-19: Secondary | ICD-10-CM | POA: Diagnosis not present

## 2022-09-02 DIAGNOSIS — R519 Headache, unspecified: Secondary | ICD-10-CM | POA: Diagnosis not present

## 2022-09-12 DIAGNOSIS — H6503 Acute serous otitis media, bilateral: Secondary | ICD-10-CM | POA: Diagnosis not present

## 2022-09-22 DIAGNOSIS — H5213 Myopia, bilateral: Secondary | ICD-10-CM | POA: Diagnosis not present

## 2022-09-22 DIAGNOSIS — H524 Presbyopia: Secondary | ICD-10-CM | POA: Diagnosis not present

## 2022-11-10 DIAGNOSIS — K080 Exfoliation of teeth due to systemic causes: Secondary | ICD-10-CM | POA: Diagnosis not present

## 2022-11-12 ENCOUNTER — Other Ambulatory Visit: Payer: Medicare HMO

## 2022-11-21 DIAGNOSIS — I480 Paroxysmal atrial fibrillation: Secondary | ICD-10-CM | POA: Diagnosis not present

## 2022-11-21 DIAGNOSIS — K219 Gastro-esophageal reflux disease without esophagitis: Secondary | ICD-10-CM | POA: Diagnosis not present

## 2022-11-21 DIAGNOSIS — Z79899 Other long term (current) drug therapy: Secondary | ICD-10-CM | POA: Diagnosis not present

## 2022-11-21 DIAGNOSIS — M810 Age-related osteoporosis without current pathological fracture: Secondary | ICD-10-CM | POA: Diagnosis not present

## 2022-11-24 DIAGNOSIS — H2511 Age-related nuclear cataract, right eye: Secondary | ICD-10-CM | POA: Diagnosis not present

## 2022-11-27 DIAGNOSIS — M810 Age-related osteoporosis without current pathological fracture: Secondary | ICD-10-CM | POA: Diagnosis not present

## 2022-12-03 DIAGNOSIS — M25562 Pain in left knee: Secondary | ICD-10-CM | POA: Diagnosis not present

## 2022-12-03 DIAGNOSIS — M25561 Pain in right knee: Secondary | ICD-10-CM | POA: Diagnosis not present

## 2022-12-03 DIAGNOSIS — M170 Bilateral primary osteoarthritis of knee: Secondary | ICD-10-CM | POA: Diagnosis not present

## 2022-12-12 DIAGNOSIS — Z881 Allergy status to other antibiotic agents status: Secondary | ICD-10-CM | POA: Diagnosis not present

## 2022-12-12 DIAGNOSIS — M1990 Unspecified osteoarthritis, unspecified site: Secondary | ICD-10-CM | POA: Diagnosis not present

## 2022-12-12 DIAGNOSIS — H269 Unspecified cataract: Secondary | ICD-10-CM | POA: Diagnosis not present

## 2022-12-12 DIAGNOSIS — Z8616 Personal history of COVID-19: Secondary | ICD-10-CM | POA: Diagnosis not present

## 2022-12-12 DIAGNOSIS — Z79899 Other long term (current) drug therapy: Secondary | ICD-10-CM | POA: Diagnosis not present

## 2022-12-12 DIAGNOSIS — Z9889 Other specified postprocedural states: Secondary | ICD-10-CM | POA: Diagnosis not present

## 2022-12-12 DIAGNOSIS — H2512 Age-related nuclear cataract, left eye: Secondary | ICD-10-CM | POA: Diagnosis not present

## 2022-12-17 DIAGNOSIS — T8522XA Displacement of intraocular lens, initial encounter: Secondary | ICD-10-CM | POA: Diagnosis not present

## 2022-12-17 DIAGNOSIS — M1990 Unspecified osteoarthritis, unspecified site: Secondary | ICD-10-CM | POA: Diagnosis not present

## 2022-12-17 DIAGNOSIS — H27132 Posterior dislocation of lens, left eye: Secondary | ICD-10-CM | POA: Diagnosis not present

## 2022-12-17 DIAGNOSIS — H538 Other visual disturbances: Secondary | ICD-10-CM | POA: Diagnosis not present

## 2022-12-17 DIAGNOSIS — Y772 Prosthetic and other implants, materials and accessory ophthalmic devices associated with adverse incidents: Secondary | ICD-10-CM | POA: Diagnosis not present

## 2022-12-17 DIAGNOSIS — Z9889 Other specified postprocedural states: Secondary | ICD-10-CM | POA: Diagnosis not present

## 2022-12-17 DIAGNOSIS — Z881 Allergy status to other antibiotic agents status: Secondary | ICD-10-CM | POA: Diagnosis not present

## 2022-12-17 DIAGNOSIS — Z79899 Other long term (current) drug therapy: Secondary | ICD-10-CM | POA: Diagnosis not present

## 2022-12-17 DIAGNOSIS — Z8616 Personal history of COVID-19: Secondary | ICD-10-CM | POA: Diagnosis not present

## 2022-12-18 DIAGNOSIS — H25013 Cortical age-related cataract, bilateral: Secondary | ICD-10-CM | POA: Diagnosis not present

## 2023-02-11 DIAGNOSIS — J0121 Acute recurrent ethmoidal sinusitis: Secondary | ICD-10-CM | POA: Diagnosis not present

## 2023-02-11 DIAGNOSIS — G4709 Other insomnia: Secondary | ICD-10-CM | POA: Diagnosis not present

## 2023-02-11 DIAGNOSIS — K219 Gastro-esophageal reflux disease without esophagitis: Secondary | ICD-10-CM | POA: Diagnosis not present

## 2023-03-09 DIAGNOSIS — G4700 Insomnia, unspecified: Secondary | ICD-10-CM | POA: Diagnosis not present

## 2023-03-09 DIAGNOSIS — R002 Palpitations: Secondary | ICD-10-CM | POA: Diagnosis not present

## 2023-03-19 DIAGNOSIS — H25013 Cortical age-related cataract, bilateral: Secondary | ICD-10-CM | POA: Diagnosis not present

## 2023-03-26 DIAGNOSIS — H02814 Retained foreign body in left upper eyelid: Secondary | ICD-10-CM | POA: Diagnosis not present

## 2023-04-08 DIAGNOSIS — H04123 Dry eye syndrome of bilateral lacrimal glands: Secondary | ICD-10-CM | POA: Diagnosis not present

## 2023-04-08 DIAGNOSIS — H25811 Combined forms of age-related cataract, right eye: Secondary | ICD-10-CM | POA: Diagnosis not present

## 2023-04-08 DIAGNOSIS — H01002 Unspecified blepharitis right lower eyelid: Secondary | ICD-10-CM | POA: Diagnosis not present

## 2023-04-08 DIAGNOSIS — H01005 Unspecified blepharitis left lower eyelid: Secondary | ICD-10-CM | POA: Diagnosis not present

## 2023-04-09 DIAGNOSIS — M170 Bilateral primary osteoarthritis of knee: Secondary | ICD-10-CM | POA: Diagnosis not present

## 2023-04-16 DIAGNOSIS — H52221 Regular astigmatism, right eye: Secondary | ICD-10-CM | POA: Diagnosis not present

## 2023-04-16 DIAGNOSIS — T7840XA Allergy, unspecified, initial encounter: Secondary | ICD-10-CM | POA: Diagnosis not present

## 2023-04-16 DIAGNOSIS — H25811 Combined forms of age-related cataract, right eye: Secondary | ICD-10-CM | POA: Diagnosis not present

## 2023-04-27 ENCOUNTER — Encounter: Payer: Self-pay | Admitting: Oncology

## 2023-05-25 NOTE — Progress Notes (Unsigned)
Cardiology Office Note:  .    Date:  05/27/2023  ID:  Jenna [name], DOB Jan 07, 1956, MRN [id number] PCP: [facility] Providers Cardiologist:  None     CC: New palpitations. Consulted for the evaluation of AF eval at the behest of [facility]  History of Present Illness: .    Jenna [name] is a 67 y.o. female Paroxsymal atrial fibrillation, and HLD complicated by statin myopathy (tolerated pravastatin).  Hx of prior gastric ulcer.  Discussed the use of AI scribe software for clinical note transcription with the patient, who gave verbal consent to proceed.  Jenna [name], a 67 year old patient with a history of hyperlipidemia and paroxysmal atrial fibrillation, presents after a period of lost follow-up. The patient has a history of intolerance to multiple statins, including atorvastatin, rosuvastatin, and pravastatin, due to myopathy. She also had a negative experience with Praluent, a PCSK9 inhibitor, which resulted in bleeding.  The patient reports experiencing episodes of rapid heartbeat, particularly noticeable at night when lying down. These episodes were associated with significant distress, leading to attempts at self-management with chamomile tea and other calming activities. The patient also reports a recent history of a respiratory infection with associated earache, cough, and headache, which has largely resolved.  In addition to these concerns, the patient has noticed ongoing issues with her legs, particularly increased sensitivity and pain upon minor trauma. She has a history of multiple leg injuries and has noticed that even minor bumps can cause significant discomfort. She also reports a history of a long-standing hematoma on one leg.  The patient also reports episodes of significant lower extremity swelling, particularly noticeable after air travel. Despite the use of compression stockings, the swelling was significant  enough to prevent the patient from wearing her regular shoes for several days.  The patient's history of atrial fibrillation and gastric ulcer complicates her management, particularly in relation to potential anticoagulation therapy. The patient had previously discontinued anticoagulation due to a significant gastric ulcer that required blood transfusion.  Relevant histories: .  Social Former patient ROS: As per HPI.      Physical Exam:    VS:  BP 120/80   Pulse 88   Ht 5\' 3"  (1.6 m)   Wt 131 lb (59.4 kg)   SpO2 96%   BMI 23.21 kg/m    Wt Readings from Last 3 Encounters:  05/27/23 131 lb (59.4 kg)  04/21/21 150 lb (68 kg)  04/29/19 150 lb (68 kg)    Gen: no distress  Neck: No JVD Cardiac: No Rubs or Gallops, systolic murmur, RRR +2 radial pulses Respiratory: Clear to auscultation bilaterally, normal effort, normal  respiratory rate GI: Soft, nontender, non-distended  MS: No  edema;  moves all extremities Integument: Skin feels warm Neuro:  At time of evaluation, alert and oriented to person/place/time/situation  Psych: Normal affect, patient feels well   ASSESSMENT AND PLAN: .    Paroxysmal Atrial Fibrillation History of AFib with recent episodes of rapid heartbeat at night. No current anticoagulation due to history of gastric ulcer. -Order a 2-week non-live heart monitor to assess for AFib. -If AFib is detected, CHADSVASC 2 and would consider restarting anticoagulation with close monitoring (CBC in 1 week) due to prior gastric ulcer. -Order an echocardiogram due to new systolic heart murmur; I suspect she is hyperdynamic.  Hyperlipidemia History of statin myopathy with intolerance to atorvastatin, rosuvastatin, pravastatin, and Praluent. - would check for coverage for inclisiran  Lower Extremity Edema Occasional lower extremity edema, possibly non-cardiac in origin. Echo as above  Follow-up Plan to see the patient in 3 months with one of my allied health  professional teammates, unless significant AFib is detected on the heart monitor.   Riley Lam, MD FASE [facility] Cardiologist [facility]  [address] [place], [address] [place], [place] [address] [phone]  4:24 PM

## 2023-05-26 DIAGNOSIS — K080 Exfoliation of teeth due to systemic causes: Secondary | ICD-10-CM | POA: Diagnosis not present

## 2023-05-27 ENCOUNTER — Ambulatory Visit: Payer: Medicare Other | Attending: Internal Medicine | Admitting: Internal Medicine

## 2023-05-27 ENCOUNTER — Encounter: Payer: Self-pay | Admitting: Internal Medicine

## 2023-05-27 ENCOUNTER — Ambulatory Visit: Payer: Medicare Other | Attending: Internal Medicine

## 2023-05-27 VITALS — BP 120/80 | HR 88 | Ht 63.0 in | Wt 131.0 lb

## 2023-05-27 DIAGNOSIS — E785 Hyperlipidemia, unspecified: Secondary | ICD-10-CM

## 2023-05-27 DIAGNOSIS — T466X5D Adverse effect of antihyperlipidemic and antiarteriosclerotic drugs, subsequent encounter: Secondary | ICD-10-CM

## 2023-05-27 DIAGNOSIS — I4891 Unspecified atrial fibrillation: Secondary | ICD-10-CM | POA: Diagnosis not present

## 2023-05-27 DIAGNOSIS — G720 Drug-induced myopathy: Secondary | ICD-10-CM

## 2023-05-27 DIAGNOSIS — R002 Palpitations: Secondary | ICD-10-CM

## 2023-05-27 DIAGNOSIS — E7801 Familial hypercholesterolemia: Secondary | ICD-10-CM

## 2023-05-27 DIAGNOSIS — E78019 Familial hypercholesterolemia, unspecified: Secondary | ICD-10-CM

## 2023-05-27 NOTE — Progress Notes (Unsigned)
ZIO XT serial # [id number] from office inventory applied to patient.    Dr. Izora Ribas to read.

## 2023-05-27 NOTE — Patient Instructions (Signed)
Medication Instructions:   *If you need a refill on your cardiac medications before your next appointment, please call your pharmacy*   Lab Work:  If you have labs (blood work) drawn today and your tests are completely normal, you will receive your results only by: MyChart Message (if you have MyChart) OR A paper copy in the mail If you have any lab test that is abnormal or we need to change your treatment, we will call you to review the results.   Testing/Procedures: Jenna [name]- Long Term Monitor Instructions  Your physician has requested you wear a ZIO patch monitor for 14 days.  This is a single patch monitor. [employer] supplies one patch monitor per enrollment. Additional stickers are not available. Please do not apply patch if you will be having a Nuclear Stress Test,  Echocardiogram, Cardiac CT, MRI, or Chest Xray during the period you would be wearing the  monitor. The patch cannot be worn during these tests. You cannot remove and re-apply the  ZIO XT patch monitor.  Your ZIO patch monitor will be mailed 3 day USPS to your address on file. It may take 3-5 days  to receive your monitor after you have been enrolled.  Once you have received your monitor, please review the enclosed instructions. Your monitor  has already been registered assigning a specific monitor serial # to you.  Billing and Patient Assistance Program Information  We have supplied [employer] with any of your insurance information on file for billing purposes. [employer] offers a sliding scale Patient Assistance Program for patients that do not have  insurance, or whose insurance does not completely cover the cost of the ZIO monitor.  You must apply for the Patient Assistance Program to qualify for this discounted rate.  To apply, please call [employer] at [phone], select option 4, select option 2, ask to apply for  Patient Assistance Program. Meredeth Ide will ask your household income, and how many people  are in your  household. They will quote your out-of-pocket cost based on that information.  [employer] will also be able to set up a 44-month, interest-free payment plan if needed.  Applying the monitor   Shave hair from upper left chest.  Hold abrader disc by orange tab. Rub abrader in 40 strokes over the upper left chest as  indicated in your monitor instructions.  Clean area with 4 enclosed alcohol pads. Let dry.  Apply patch as indicated in monitor instructions. Patch will be placed under collarbone on left  side of chest with arrow pointing upward.  Rub patch adhesive wings for 2 minutes. Remove white label marked "1". Remove the white  label marked "2". Rub patch adhesive wings for 2 additional minutes.  While looking in a mirror, press and release button in center of patch. A small green light will  flash 3-4 times. This will be your only indicator that the monitor has been turned on.  Do not shower for the first 24 hours. You may shower after the first 24 hours.  Press the button if you feel a symptom. You will hear a small click. Record Date, Time and  Symptom in the Patient Logbook.  When you are ready to remove the patch, follow instructions on the last 2 pages of Patient  Logbook. Stick patch monitor onto the last page of Patient Logbook.  Place Patient Logbook in the blue and white box. Use locking tab on box and tape box closed  securely. The blue and white box has prepaid  postage on it. Please place it in the mailbox as  soon as possible. Your physician should have your test results approximately 7 days after the  monitor has been mailed back to [facility].  Call [facility] Customer Care at [phone] if you have questions regarding  your ZIO XT patch monitor. Call them immediately if you see an orange light blinking on your  monitor.  If your monitor falls off in less than 4 days, contact our Monitor department at [phone].  If your monitor becomes loose or falls off after  4 days call [employer] at [phone] for  suggestions on securing your monitor  Your physician has requested that you have an echocardiogram. Echocardiography is a painless test that uses sound waves to create images of your heart. It provides your doctor with information about the size and shape of your heart and how well your heart's chambers and valves are working. This procedure takes approximately one hour. There are no restrictions for this procedure. Please do NOT wear cologne, perfume, aftershave, or lotions (deodorant is allowed). Please arrive 15 minutes prior to your appointment time.  Please note: We ask at that you not bring children with you during ultrasound (echo/ vascular) testing. Due to room size and safety concerns, children are not allowed in the ultrasound rooms during exams. Our front office staff cannot provide observation of children in our lobby area while testing is being conducted. An adult accompanying a patient to their appointment will only be allowed in the ultrasound room at the discretion of the ultrasound technician under special circumstances. We apologize for any inconvenience.    Follow-Up: At [facility], you and your health needs are our priority.  As part of our continuing mission to provide you with exceptional heart care, we have created designated Provider Care Teams.  These Care Teams include your primary Cardiologist (physician) and Advanced Practice Providers (APPs -  Physician Assistants and Nurse Practitioners) who all work together to provide you with the care you need, when you need it.  We recommend signing up for the patient portal called "MyChart".  Sign up information is provided on this After Visit Summary.  MyChart is used to connect with patients for Virtual Visits (Telemedicine).  Patients are able to view lab/test results, encounter notes, upcoming appointments, etc.  Non-urgent messages can be sent to your provider as well.   To  learn more about what you can do with MyChart, go to [email or web address].    Your next appointment:  3 months with APP and appt with Pharm D

## 2023-05-29 ENCOUNTER — Telehealth: Payer: Self-pay | Admitting: Pharmacist

## 2023-05-29 NOTE — Telephone Encounter (Signed)
Patient called back reports she had tried various statins both PCSK9i and Nexlizet - unable to tolerate. Would like to try Leqvio - patient will come at [facility] office on Monday 06/01/2023 to sign Leqvio start form

## 2023-05-29 NOTE — Telephone Encounter (Signed)
Jenna [name]  P Cv Div Pharmd Patient was seen today and did not want to schedule lipid clinic appointment until she talked to the pharmacist.  She said she want to know the cost of the injections before scheduling her appt.  Can someone please call her at your convenience.  Thank you   Called patient, N/A, LVM.

## 2023-06-01 ENCOUNTER — Other Ambulatory Visit: Payer: Self-pay | Admitting: *Deleted

## 2023-06-01 ENCOUNTER — Telehealth: Payer: Self-pay | Admitting: *Deleted

## 2023-06-01 ENCOUNTER — Ambulatory Visit: Payer: Medicare Other | Attending: Internal Medicine

## 2023-06-01 DIAGNOSIS — I4891 Unspecified atrial fibrillation: Secondary | ICD-10-CM

## 2023-06-01 DIAGNOSIS — R002 Palpitations: Secondary | ICD-10-CM

## 2023-06-01 DIAGNOSIS — M810 Age-related osteoporosis without current pathological fracture: Secondary | ICD-10-CM | POA: Diagnosis not present

## 2023-06-01 NOTE — Telephone Encounter (Signed)
The original order for a ZIO XT was entered as home enrollment, but then the nurse asked if we could apply in the office.  The order was not updated to clinic enrollment.  This is why [employer] mailed a monitor to your home.  I have contacted [employer] to cancel the charges on the mailed monitor.  I have cancelled the order and re-entered an order for a clinic applied monitor and assured [employer] has the correct serial #.   When you mail back the monitor you are wearing, we would appreciate if you would mail back the unused monitor as well. Just open the UPS package and tape the blue and white box shut.  It has prepaid postage and just needs to be dropped in the mail. You will only be billed for the monitor you are currently wearing.

## 2023-06-01 NOTE — Telephone Encounter (Signed)
-----   Message from Henry S sent at 06/01/2023 10:09 AM EST ----- Regarding: zio monitor Can you please call this pt? She said she had a zio put on while she was here and then mailed a zio, she doesn't know what to do!   Thank you

## 2023-06-01 NOTE — Progress Notes (Unsigned)
[id number] applied in office on 05/27/23.  Order updated as original order was entered as home enrollment.

## 2023-06-02 NOTE — Telephone Encounter (Signed)
Leqvio start from faxed on 06/01/2023

## 2023-06-03 DIAGNOSIS — H5213 Myopia, bilateral: Secondary | ICD-10-CM | POA: Diagnosis not present

## 2023-06-03 NOTE — Telephone Encounter (Signed)
Received Leqvio coverage assessment back. Per assessment Jenna [name] is subject to 20% co-insurance and $3500 out of pocket max ($1,365.29 met). Once met, Jenna [name] will then covered at 100%.  Cost reviewed with patient, she does not want to go for the treatment because of the cost. Does not want to try nexletol. She has latex allergy so Repatha would not be appropriate alternative and hx of intolerance to Praluent - severe diarrhea with bleeding, ezetimibe -stomach cramping, myopathy from atorvastatin, rosuvastatin, pravastatin. Patient does not want to make appointment with lipid clinic. Wants [place] to inform Dr.Chandrasekhar.

## 2023-06-17 ENCOUNTER — Telehealth: Payer: Self-pay | Admitting: Internal Medicine

## 2023-06-17 DIAGNOSIS — R002 Palpitations: Secondary | ICD-10-CM | POA: Diagnosis not present

## 2023-06-17 DIAGNOSIS — I4891 Unspecified atrial fibrillation: Secondary | ICD-10-CM | POA: Diagnosis not present

## 2023-06-17 NOTE — Telephone Encounter (Signed)
Verbally spoke with Izora Ribas, MD via phone advised pt should start Eliquis 5 mg PO BID with CBC in 1 week.   Left a message for pt to call back.

## 2023-06-17 NOTE — Telephone Encounter (Signed)
Called pt advised of MD recommendation.  Pt does not want to take a blood thinner.  Advised pt of the reason for blood thinner.  Pt still hesitant.  Advised pt will send message to MD to f/u she would like to review pros and cons of starting Eliquis.  Advised pt after today at 5 pm our office is closed until Monday.  So MD may not respond before this time.

## 2023-06-17 NOTE — Telephone Encounter (Signed)
Received call from [employer] reporting pt had an episode of rapid At Fib 06/05/23 at 5:15 pm rate 194 bpm for 60 seconds. Page 9 strip 7.   Will forward to Dr Izora Ribas and his nurse for their knowledge/review.  Pt medication list currently does not list anticoagulation. Per office note 05/27/23 - Paroxysmal Atrial Fibrillation History of AFib with recent episodes of rapid heartbeat at night. No current anticoagulation due to history of gastric ulcer. -Order a 2-week non-live heart monitor to assess for AFib. -If AFib is detected, CHADSVASC 2 and would consider restarting anticoagulation with close monitoring (CBC in 1 week) due to prior gastric ulcer.

## 2023-06-17 NOTE — Telephone Encounter (Signed)
Caller Marcelle Smiling) is reporting abnormal results.

## 2023-06-17 NOTE — Telephone Encounter (Signed)
Marland Kitchen  patient is returning phone call

## 2023-06-30 ENCOUNTER — Other Ambulatory Visit: Payer: Self-pay | Admitting: Family Medicine

## 2023-06-30 DIAGNOSIS — Z1231 Encounter for screening mammogram for malignant neoplasm of breast: Secondary | ICD-10-CM

## 2023-07-03 NOTE — Telephone Encounter (Signed)
MD previously called and spoke with pt.  Regarding heart monitor findings.   Called pt left a message to f/u.  Would like to know if pt would like to start eliquis or Asprin 325 mg.

## 2023-07-03 NOTE — Telephone Encounter (Signed)
Dr. Izora Ribas spoke with pt regarding results please see heart monitor results for more details.

## 2023-07-06 ENCOUNTER — Ambulatory Visit (HOSPITAL_COMMUNITY): Payer: Medicare Other | Attending: Internal Medicine

## 2023-07-06 DIAGNOSIS — I4891 Unspecified atrial fibrillation: Secondary | ICD-10-CM

## 2023-07-06 DIAGNOSIS — R002 Palpitations: Secondary | ICD-10-CM

## 2023-07-06 DIAGNOSIS — E785 Hyperlipidemia, unspecified: Secondary | ICD-10-CM

## 2023-07-06 LAB — ECHOCARDIOGRAM COMPLETE
Area-P 1/2: 3.34 cm2
S' Lateral: 2.7 cm

## 2023-07-08 DIAGNOSIS — J40 Bronchitis, not specified as acute or chronic: Secondary | ICD-10-CM | POA: Diagnosis not present

## 2023-07-09 ENCOUNTER — Encounter: Payer: Self-pay | Admitting: Internal Medicine

## 2023-07-09 DIAGNOSIS — I4891 Unspecified atrial fibrillation: Secondary | ICD-10-CM

## 2023-07-13 MED ORDER — ASPIRIN 325 MG PO TBEC: 325.0000 mg | DELAYED_RELEASE_TABLET | Freq: Every day | ORAL | 3 refills | Status: DC

## 2023-07-14 NOTE — Telephone Encounter (Signed)
MD has advised pt to start Aspirin 325 mg please see my chart encounter for more details.

## 2023-07-16 DIAGNOSIS — M170 Bilateral primary osteoarthritis of knee: Secondary | ICD-10-CM | POA: Diagnosis not present

## 2023-07-20 DIAGNOSIS — Z23 Encounter for immunization: Secondary | ICD-10-CM | POA: Diagnosis not present

## 2023-07-27 DIAGNOSIS — I4891 Unspecified atrial fibrillation: Secondary | ICD-10-CM | POA: Diagnosis not present

## 2023-07-27 LAB — CBC
Hematocrit: 42.6 % (ref 34.0–46.6)
Hemoglobin: 14.3 g/dL (ref 11.1–15.9)
MCH: 31.8 pg (ref 26.6–33.0)
MCHC: 33.6 g/dL (ref 31.5–35.7)
MCV: 95 fL (ref 79–97)
Platelets: 309 10*3/uL (ref 150–450)
RBC: 4.5 x10E6/uL (ref 3.77–5.28)
RDW: 13.3 % (ref 11.7–15.4)
WBC: 8.8 10*3/uL (ref 3.4–10.8)

## 2023-07-27 NOTE — Telephone Encounter (Signed)
 Are you okay with pt taking Excedrin that has 250 mg of aspirin in it  and a 81 mg ASA?

## 2023-07-31 ENCOUNTER — Ambulatory Visit
Admission: RE | Admit: 2023-07-31 | Discharge: 2023-07-31 | Disposition: A | Discharge status: Home / Self Care | Payer: Medicare Other | Source: Ambulatory Visit | Attending: Family Medicine | Admitting: Family Medicine

## 2023-07-31 DIAGNOSIS — Z1231 Encounter for screening mammogram for malignant neoplasm of breast: Secondary | ICD-10-CM

## 2023-08-14 DIAGNOSIS — J0140 Acute pansinusitis, unspecified: Secondary | ICD-10-CM | POA: Diagnosis not present

## 2023-08-15 ENCOUNTER — Other Ambulatory Visit: Payer: Self-pay

## 2023-08-15 ENCOUNTER — Inpatient Hospital Stay (HOSPITAL_COMMUNITY)
Admission: EM | Admit: 2023-08-15 | Discharge: 2023-08-18 | DRG: 355 | Disposition: A | Discharge status: Home / Self Care | Payer: Medicare Other | Source: Ambulatory Visit | Attending: Student | Admitting: Student

## 2023-08-15 ENCOUNTER — Encounter (HOSPITAL_COMMUNITY): Payer: Self-pay | Admitting: *Deleted

## 2023-08-15 DIAGNOSIS — Z79899 Other long term (current) drug therapy: Secondary | ICD-10-CM | POA: Diagnosis not present

## 2023-08-15 DIAGNOSIS — Z8711 Personal history of peptic ulcer disease: Secondary | ICD-10-CM

## 2023-08-15 DIAGNOSIS — G43909 Migraine, unspecified, not intractable, without status migrainosus: Secondary | ICD-10-CM | POA: Diagnosis present

## 2023-08-15 DIAGNOSIS — K460 Unspecified abdominal hernia with obstruction, without gangrene: Secondary | ICD-10-CM | POA: Diagnosis present

## 2023-08-15 DIAGNOSIS — Z8249 Family history of ischemic heart disease and other diseases of the circulatory system: Secondary | ICD-10-CM | POA: Diagnosis not present

## 2023-08-15 DIAGNOSIS — J019 Acute sinusitis, unspecified: Secondary | ICD-10-CM | POA: Diagnosis present

## 2023-08-15 DIAGNOSIS — Z72820 Sleep deprivation: Secondary | ICD-10-CM

## 2023-08-15 DIAGNOSIS — K219 Gastro-esophageal reflux disease without esophagitis: Secondary | ICD-10-CM | POA: Diagnosis present

## 2023-08-15 DIAGNOSIS — K430 Incisional hernia with obstruction, without gangrene: Secondary | ICD-10-CM | POA: Diagnosis not present

## 2023-08-15 DIAGNOSIS — R1084 Generalized abdominal pain: Secondary | ICD-10-CM | POA: Diagnosis not present

## 2023-08-15 DIAGNOSIS — Z7982 Long term (current) use of aspirin: Secondary | ICD-10-CM

## 2023-08-15 DIAGNOSIS — K56600 Partial intestinal obstruction, unspecified as to cause: Secondary | ICD-10-CM | POA: Diagnosis not present

## 2023-08-15 DIAGNOSIS — Z888 Allergy status to other drugs, medicaments and biological substances status: Secondary | ICD-10-CM | POA: Diagnosis not present

## 2023-08-15 DIAGNOSIS — Z91040 Latex allergy status: Secondary | ICD-10-CM

## 2023-08-15 DIAGNOSIS — I480 Paroxysmal atrial fibrillation: Secondary | ICD-10-CM | POA: Diagnosis not present

## 2023-08-15 DIAGNOSIS — Z860100 Personal history of colon polyps, unspecified: Secondary | ICD-10-CM | POA: Diagnosis not present

## 2023-08-15 DIAGNOSIS — E7800 Pure hypercholesterolemia, unspecified: Secondary | ICD-10-CM | POA: Diagnosis present

## 2023-08-15 DIAGNOSIS — R112 Nausea with vomiting, unspecified: Secondary | ICD-10-CM | POA: Diagnosis not present

## 2023-08-15 DIAGNOSIS — Z881 Allergy status to other antibiotic agents status: Secondary | ICD-10-CM | POA: Diagnosis not present

## 2023-08-15 DIAGNOSIS — K5790 Diverticulosis of intestine, part unspecified, without perforation or abscess without bleeding: Secondary | ICD-10-CM | POA: Diagnosis not present

## 2023-08-15 DIAGNOSIS — I10 Essential (primary) hypertension: Secondary | ICD-10-CM | POA: Diagnosis not present

## 2023-08-15 DIAGNOSIS — E785 Hyperlipidemia, unspecified: Secondary | ICD-10-CM | POA: Diagnosis not present

## 2023-08-15 DIAGNOSIS — K436 Other and unspecified ventral hernia with obstruction, without gangrene: Principal | ICD-10-CM

## 2023-08-15 DIAGNOSIS — K828 Other specified diseases of gallbladder: Secondary | ICD-10-CM | POA: Diagnosis not present

## 2023-08-15 DIAGNOSIS — Z7983 Long term (current) use of bisphosphonates: Secondary | ICD-10-CM

## 2023-08-15 DIAGNOSIS — Z90710 Acquired absence of both cervix and uterus: Secondary | ICD-10-CM | POA: Diagnosis not present

## 2023-08-15 DIAGNOSIS — Z91048 Other nonmedicinal substance allergy status: Secondary | ICD-10-CM | POA: Diagnosis not present

## 2023-08-15 DIAGNOSIS — K450 Other specified abdominal hernia with obstruction, without gangrene: Secondary | ICD-10-CM | POA: Diagnosis not present

## 2023-08-15 DIAGNOSIS — K56609 Unspecified intestinal obstruction, unspecified as to partial versus complete obstruction: Secondary | ICD-10-CM | POA: Diagnosis not present

## 2023-08-15 DIAGNOSIS — K439 Ventral hernia without obstruction or gangrene: Secondary | ICD-10-CM | POA: Diagnosis not present

## 2023-08-15 LAB — URINALYSIS, ROUTINE W REFLEX MICROSCOPIC
Bilirubin Urine: NEGATIVE
Glucose, UA: NEGATIVE mg/dL
Hgb urine dipstick: NEGATIVE
Ketones, ur: NEGATIVE mg/dL
Leukocytes,Ua: NEGATIVE
Nitrite: NEGATIVE
Protein, ur: NEGATIVE mg/dL
Specific Gravity, Urine: 1.012 (ref 1.005–1.030)
pH: 7 (ref 5.0–8.0)

## 2023-08-15 LAB — COMPREHENSIVE METABOLIC PANEL
ALT: 18 U/L (ref 0–44)
AST: 19 U/L (ref 15–41)
Albumin: 3.8 g/dL (ref 3.5–5.0)
Alkaline Phosphatase: 69 U/L (ref 38–126)
Anion gap: 10 (ref 5–15)
BUN: 6 mg/dL — ABNORMAL LOW (ref 8–23)
CO2: 25 mmol/L (ref 22–32)
Calcium: 9.6 mg/dL (ref 8.9–10.3)
Chloride: 104 mmol/L (ref 98–111)
Creatinine, Ser: 0.58 mg/dL (ref 0.44–1.00)
GFR, Estimated: >60 mL/min (ref >60)
Glucose, Bld: 113 mg/dL — ABNORMAL HIGH (ref 70–99)
Potassium: 3.6 mmol/L (ref 3.5–5.1)
Sodium: 139 mmol/L (ref 135–145)
Total Bilirubin: 0.5 mg/dL (ref 0.0–1.2)
Total Protein: 6.8 g/dL (ref 6.5–8.1)

## 2023-08-15 LAB — CBC
HCT: 41.7 % (ref 36.0–46.0)
Hemoglobin: 13.7 g/dL (ref 12.0–15.0)
MCH: 32 pg (ref 26.0–34.0)
MCHC: 32.9 g/dL (ref 30.0–36.0)
MCV: 97.4 fL (ref 80.0–100.0)
Platelets: 292 10*3/uL (ref 150–400)
RBC: 4.28 MIL/uL (ref 3.87–5.11)
RDW: 13 % (ref 11.5–15.5)
WBC: 6.4 10*3/uL (ref 4.0–10.5)
nRBC: 0 % (ref 0.0–0.2)

## 2023-08-15 LAB — LIPASE, BLOOD: Lipase: 38 U/L (ref 11–51)

## 2023-08-15 MED ORDER — ONDANSETRON HCL 4 MG/2ML IJ SOLN
4.0000 mg | Freq: Once | INTRAMUSCULAR | Status: AC
  Administered 2023-08-15: 4 mg via INTRAVENOUS
  Filled 2023-08-15: qty 2

## 2023-08-15 MED ORDER — FENTANYL CITRATE PF 50 MCG/ML IJ SOSY
50.0000 ug | PREFILLED_SYRINGE | Freq: Once | INTRAMUSCULAR | Status: AC
  Administered 2023-08-15: 50 ug via INTRAVENOUS
  Filled 2023-08-15: qty 1

## 2023-08-15 NOTE — ED Notes (Signed)
Ice pack placed on her abd

## 2023-08-15 NOTE — ED Provider Triage Note (Signed)
Emergency Medicine Provider Triage Evaluation Note  Jenna [name] , a 68 y.o. female  was evaluated in triage.  Pt complains of abdominal pain.  In urgent care and sent over immediately for concern for incarcerated hernia.  Patient states that she was out today began having some abdominal pain.  She just started taking an antibiotic for an unrelated issue and felt like it was not likely due to that however she felt her stomach and noticed there was a large firm very painful bump in the upper region of her abdomen.  She denies nausea or vomiting.  Review of Systems  Positive: Abdominal pain Negative: Vomiting  Physical Exam  BP (!) 165/90 (BP Location: Left Arm)   Pulse 96   Temp 97.9 F (36.6 C)   Resp 18   Ht 5\' 3"  (1.6 m)   Wt 59.4 kg   SpO2 99%   BMI 23.20 kg/m  Gen:   Awake, no distress   Resp:  Normal effort  MSK:   Moves extremities without difficulty  Other:  Firm, extremely tender 8 cm region of swelling near the epigastrium.  Concern for potential incarcerated hernia.  Medical Decision Making  Medically screening exam initiated at 9:30 PM.  Appropriate orders placed.  Jenna [name] was informed that the remainder of the evaluation will be completed by another provider, this initial triage assessment does not replace that evaluation, and the importance of remaining in the ED until their evaluation is complete.  Patient with suspected abdominal wall hernia.  I have asked that the triage nurse apply ice to the affected area.  I have also informed the charge nurse that the patient needs a bed in the back as soon as possible.   Arthor Captain, PA-C 08/15/23 2133

## 2023-08-15 NOTE — ED Triage Notes (Signed)
The pt started having abd pain approx 1600 and she went to the urgent care at [facility] and they told her to come to the hospital in case she had an incarcerated  hernia no previous history

## 2023-08-16 ENCOUNTER — Emergency Department (HOSPITAL_COMMUNITY): Payer: Medicare Other | Admitting: Anesthesiology

## 2023-08-16 ENCOUNTER — Encounter (HOSPITAL_COMMUNITY): Payer: Self-pay | Admitting: Anesthesiology

## 2023-08-16 ENCOUNTER — Other Ambulatory Visit: Payer: Self-pay

## 2023-08-16 ENCOUNTER — Emergency Department (HOSPITAL_COMMUNITY): Payer: Medicare Other

## 2023-08-16 ENCOUNTER — Encounter (HOSPITAL_COMMUNITY)
Admission: EM | Disposition: A | Discharge status: Home / Self Care | Payer: Self-pay | Source: Ambulatory Visit | Attending: Student

## 2023-08-16 DIAGNOSIS — K219 Gastro-esophageal reflux disease without esophagitis: Secondary | ICD-10-CM | POA: Diagnosis present

## 2023-08-16 DIAGNOSIS — E785 Hyperlipidemia, unspecified: Secondary | ICD-10-CM | POA: Diagnosis not present

## 2023-08-16 DIAGNOSIS — I10 Essential (primary) hypertension: Secondary | ICD-10-CM

## 2023-08-16 DIAGNOSIS — K439 Ventral hernia without obstruction or gangrene: Secondary | ICD-10-CM | POA: Diagnosis not present

## 2023-08-16 DIAGNOSIS — K460 Unspecified abdominal hernia with obstruction, without gangrene: Secondary | ICD-10-CM | POA: Diagnosis not present

## 2023-08-16 DIAGNOSIS — G43909 Migraine, unspecified, not intractable, without status migrainosus: Secondary | ICD-10-CM | POA: Diagnosis present

## 2023-08-16 DIAGNOSIS — Z90710 Acquired absence of both cervix and uterus: Secondary | ICD-10-CM | POA: Diagnosis not present

## 2023-08-16 DIAGNOSIS — K56609 Unspecified intestinal obstruction, unspecified as to partial versus complete obstruction: Secondary | ICD-10-CM | POA: Diagnosis not present

## 2023-08-16 DIAGNOSIS — Z91048 Other nonmedicinal substance allergy status: Secondary | ICD-10-CM | POA: Diagnosis not present

## 2023-08-16 DIAGNOSIS — R112 Nausea with vomiting, unspecified: Secondary | ICD-10-CM | POA: Diagnosis not present

## 2023-08-16 DIAGNOSIS — Z7983 Long term (current) use of bisphosphonates: Secondary | ICD-10-CM | POA: Diagnosis not present

## 2023-08-16 DIAGNOSIS — K5790 Diverticulosis of intestine, part unspecified, without perforation or abscess without bleeding: Secondary | ICD-10-CM | POA: Diagnosis not present

## 2023-08-16 DIAGNOSIS — K56600 Partial intestinal obstruction, unspecified as to cause: Secondary | ICD-10-CM | POA: Diagnosis not present

## 2023-08-16 DIAGNOSIS — Z7982 Long term (current) use of aspirin: Secondary | ICD-10-CM | POA: Diagnosis not present

## 2023-08-16 DIAGNOSIS — Z8711 Personal history of peptic ulcer disease: Secondary | ICD-10-CM | POA: Diagnosis not present

## 2023-08-16 DIAGNOSIS — Z91040 Latex allergy status: Secondary | ICD-10-CM | POA: Diagnosis not present

## 2023-08-16 DIAGNOSIS — I480 Paroxysmal atrial fibrillation: Secondary | ICD-10-CM | POA: Diagnosis present

## 2023-08-16 DIAGNOSIS — E7800 Pure hypercholesterolemia, unspecified: Secondary | ICD-10-CM | POA: Diagnosis not present

## 2023-08-16 DIAGNOSIS — Z79899 Other long term (current) drug therapy: Secondary | ICD-10-CM | POA: Diagnosis not present

## 2023-08-16 DIAGNOSIS — Z72820 Sleep deprivation: Secondary | ICD-10-CM | POA: Diagnosis not present

## 2023-08-16 DIAGNOSIS — K436 Other and unspecified ventral hernia with obstruction, without gangrene: Secondary | ICD-10-CM | POA: Diagnosis present

## 2023-08-16 DIAGNOSIS — Z8249 Family history of ischemic heart disease and other diseases of the circulatory system: Secondary | ICD-10-CM | POA: Diagnosis not present

## 2023-08-16 DIAGNOSIS — Z881 Allergy status to other antibiotic agents status: Secondary | ICD-10-CM | POA: Diagnosis not present

## 2023-08-16 DIAGNOSIS — Z888 Allergy status to other drugs, medicaments and biological substances status: Secondary | ICD-10-CM | POA: Diagnosis not present

## 2023-08-16 DIAGNOSIS — K828 Other specified diseases of gallbladder: Secondary | ICD-10-CM | POA: Diagnosis not present

## 2023-08-16 DIAGNOSIS — K430 Incisional hernia with obstruction, without gangrene: Secondary | ICD-10-CM | POA: Diagnosis not present

## 2023-08-16 DIAGNOSIS — J019 Acute sinusitis, unspecified: Secondary | ICD-10-CM | POA: Diagnosis present

## 2023-08-16 DIAGNOSIS — Z860100 Personal history of colon polyps, unspecified: Secondary | ICD-10-CM | POA: Diagnosis not present

## 2023-08-16 HISTORY — PX: LAPAROTOMY: SHX154

## 2023-08-16 LAB — CBC
HCT: 37.9 % (ref 36.0–46.0)
Hemoglobin: 13.1 g/dL (ref 12.0–15.0)
MCH: 32.6 pg (ref 26.0–34.0)
MCHC: 34.6 g/dL (ref 30.0–36.0)
MCV: 94.3 fL (ref 80.0–100.0)
Platelets: 265 10*3/uL (ref 150–400)
RBC: 4.02 MIL/uL (ref 3.87–5.11)
RDW: 13 % (ref 11.5–15.5)
WBC: 11.4 10*3/uL — ABNORMAL HIGH (ref 4.0–10.5)
nRBC: 0 % (ref 0.0–0.2)

## 2023-08-16 LAB — COMPREHENSIVE METABOLIC PANEL
ALT: 16 U/L (ref 0–44)
AST: 21 U/L (ref 15–41)
Albumin: 3.4 g/dL — ABNORMAL LOW (ref 3.5–5.0)
Alkaline Phosphatase: 56 U/L (ref 38–126)
Anion gap: 12 (ref 5–15)
BUN: 8 mg/dL (ref 8–23)
CO2: 21 mmol/L — ABNORMAL LOW (ref 22–32)
Calcium: 8.7 mg/dL — ABNORMAL LOW (ref 8.9–10.3)
Chloride: 104 mmol/L (ref 98–111)
Creatinine, Ser: 0.69 mg/dL (ref 0.44–1.00)
GFR, Estimated: >60 mL/min (ref >60)
Glucose, Bld: 134 mg/dL — ABNORMAL HIGH (ref 70–99)
Potassium: 3.8 mmol/L (ref 3.5–5.1)
Sodium: 137 mmol/L (ref 135–145)
Total Bilirubin: 0.8 mg/dL (ref 0.0–1.2)
Total Protein: 6.3 g/dL — ABNORMAL LOW (ref 6.5–8.1)

## 2023-08-16 LAB — HIV ANTIBODY (ROUTINE TESTING W REFLEX): HIV Screen 4th Generation wRfx: NONREACTIVE

## 2023-08-16 LAB — PHOSPHORUS: Phosphorus: 4.5 mg/dL (ref 2.5–4.6)

## 2023-08-16 LAB — I-STAT CG4 LACTIC ACID, ED: Lactic Acid, Venous: 0.7 mmol/L (ref 0.5–1.9)

## 2023-08-16 LAB — MAGNESIUM: Magnesium: 1.9 mg/dL (ref 1.7–2.4)

## 2023-08-16 SURGERY — LAPAROTOMY, EXPLORATORY
Anesthesia: General | Site: Abdomen

## 2023-08-16 MED ORDER — FENTANYL CITRATE (PF) 100 MCG/2ML IJ SOLN
INTRAMUSCULAR | Status: AC
  Filled 2023-08-16: qty 2

## 2023-08-16 MED ORDER — MIDAZOLAM HCL 5 MG/5ML IJ SOLN
INTRAMUSCULAR | Status: DC | PRN
  Administered 2023-08-16 (×2): 1 mg via INTRAVENOUS

## 2023-08-16 MED ORDER — FLUTICASONE PROPIONATE 50 MCG/ACT NA SUSP
2.0000 | Freq: Every day | NASAL | Status: DC
  Administered 2023-08-16 – 2023-08-18 (×3): 2 via NASAL
  Filled 2023-08-16: qty 16

## 2023-08-16 MED ORDER — FENTANYL CITRATE (PF) 250 MCG/5ML IJ SOLN
INTRAMUSCULAR | Status: AC
  Filled 2023-08-16: qty 5

## 2023-08-16 MED ORDER — ROCURONIUM BROMIDE 100 MG/10ML IV SOLN
INTRAVENOUS | Status: DC | PRN
  Administered 2023-08-16: 40 mg via INTRAVENOUS

## 2023-08-16 MED ORDER — SUGAMMADEX SODIUM 200 MG/2ML IV SOLN
INTRAVENOUS | Status: DC | PRN
  Administered 2023-08-16: 200 mg via INTRAVENOUS

## 2023-08-16 MED ORDER — MORPHINE SULFATE (PF) 2 MG/ML IV SOLN: 2.0000 mg | INTRAVENOUS | Status: DC | PRN

## 2023-08-16 MED ORDER — ACETAMINOPHEN 325 MG PO TABS: 650.0000 mg | ORAL_TABLET | Freq: Four times a day (QID) | ORAL | Status: DC | PRN

## 2023-08-16 MED ORDER — ACETAMINOPHEN 10 MG/ML IV SOLN
1000.0000 mg | Freq: Four times a day (QID) | INTRAVENOUS | Status: AC
  Administered 2023-08-16 (×4): 1000 mg via INTRAVENOUS
  Filled 2023-08-16 (×4): qty 100

## 2023-08-16 MED ORDER — ONDANSETRON HCL 4 MG/2ML IJ SOLN
4.0000 mg | Freq: Once | INTRAMUSCULAR | Status: DC | PRN
Start: 2023-08-16 — End: 2023-08-16

## 2023-08-16 MED ORDER — PHENYLEPHRINE 80 MCG/ML (10ML) SYRINGE FOR IV PUSH (FOR BLOOD PRESSURE SUPPORT)
PREFILLED_SYRINGE | INTRAVENOUS | Status: DC | PRN
  Administered 2023-08-16: 200 ug via INTRAVENOUS

## 2023-08-16 MED ORDER — KETOROLAC TROMETHAMINE 15 MG/ML IJ SOLN
15.0000 mg | Freq: Four times a day (QID) | INTRAMUSCULAR | Status: DC
  Administered 2023-08-16 – 2023-08-18 (×8): 15 mg via INTRAVENOUS
  Filled 2023-08-16 (×8): qty 1

## 2023-08-16 MED ORDER — PANTOPRAZOLE SODIUM 40 MG PO TBEC
40.0000 mg | DELAYED_RELEASE_TABLET | Freq: Every day | ORAL | Status: DC
  Administered 2023-08-16 – 2023-08-18 (×3): 40 mg via ORAL
  Filled 2023-08-16 (×3): qty 1

## 2023-08-16 MED ORDER — IOHEXOL 350 MG/ML SOLN
75.0000 mL | Freq: Once | INTRAVENOUS | Status: AC | PRN
  Administered 2023-08-16: 75 mL via INTRAVENOUS

## 2023-08-16 MED ORDER — METHOCARBAMOL 1000 MG/10ML IJ SOLN
1000.0000 mg | Freq: Three times a day (TID) | INTRAMUSCULAR | Status: DC
Start: 2023-08-16 — End: 2023-08-18
  Administered 2023-08-16 – 2023-08-17 (×6): 1000 mg via INTRAVENOUS
  Filled 2023-08-16 (×7): qty 10

## 2023-08-16 MED ORDER — OXYCODONE HCL 5 MG PO TABS
5.0000 mg | ORAL_TABLET | ORAL | 0 refills | Status: DC | PRN
  Filled 2023-08-16: qty 15, 3d supply, fill #0

## 2023-08-16 MED ORDER — LACTATED RINGERS IV SOLN: INTRAVENOUS | Status: AC

## 2023-08-16 MED ORDER — LACTATED RINGERS IV SOLN: INTRAVENOUS | Status: DC | PRN

## 2023-08-16 MED ORDER — LIDOCAINE 2% (20 MG/ML) 5 ML SYRINGE
INTRAMUSCULAR | Status: DC | PRN
  Administered 2023-08-16: 60 mg via INTRAVENOUS

## 2023-08-16 MED ORDER — PHENYLEPHRINE HCL-NACL 20-0.9 MG/250ML-% IV SOLN
INTRAVENOUS | Status: DC | PRN
  Administered 2023-08-16: 25 ug/min via INTRAVENOUS

## 2023-08-16 MED ORDER — FENTANYL CITRATE (PF) 250 MCG/5ML IJ SOLN
INTRAMUSCULAR | Status: DC | PRN
  Administered 2023-08-16 (×3): 50 ug via INTRAVENOUS

## 2023-08-16 MED ORDER — SALINE SPRAY 0.65 % NA SOLN: 1.0000 | NASAL | Status: DC | PRN

## 2023-08-16 MED ORDER — METHOCARBAMOL 750 MG PO TABS
750.0000 mg | ORAL_TABLET | Freq: Four times a day (QID) | ORAL | 1 refills | Status: DC
  Filled 2023-08-16: qty 120, 30d supply, fill #0

## 2023-08-16 MED ORDER — CEFAZOLIN SODIUM-DEXTROSE 2-3 GM-%(50ML) IV SOLR
INTRAVENOUS | Status: DC | PRN
  Administered 2023-08-16: 2 g via INTRAVENOUS

## 2023-08-16 MED ORDER — SUCCINYLCHOLINE CHLORIDE 20 MG/ML IJ SOLN
INTRAMUSCULAR | Status: DC | PRN
  Administered 2023-08-16: 100 mg via INTRAVENOUS

## 2023-08-16 MED ORDER — ONDANSETRON HCL 4 MG/2ML IJ SOLN
INTRAMUSCULAR | Status: DC | PRN
  Administered 2023-08-16: 4 mg via INTRAVENOUS

## 2023-08-16 MED ORDER — FENTANYL CITRATE (PF) 100 MCG/2ML IJ SOLN
25.0000 ug | INTRAMUSCULAR | Status: DC | PRN
  Administered 2023-08-16 (×2): 25 ug via INTRAVENOUS

## 2023-08-16 MED ORDER — AMOXICILLIN-POT CLAVULANATE 875-125 MG PO TABS
1.0000 | ORAL_TABLET | Freq: Two times a day (BID) | ORAL | Status: DC
  Administered 2023-08-16 – 2023-08-18 (×5): 1 via ORAL
  Filled 2023-08-16 (×5): qty 1

## 2023-08-16 MED ORDER — DEXAMETHASONE SODIUM PHOSPHATE 4 MG/ML IJ SOLN
INTRAMUSCULAR | Status: DC | PRN
  Administered 2023-08-16: 10 mg via INTRAVENOUS

## 2023-08-16 MED ORDER — PROPOFOL 10 MG/ML IV BOLUS
INTRAVENOUS | Status: DC | PRN
  Administered 2023-08-16: 130 mg via INTRAVENOUS

## 2023-08-16 MED ORDER — ACETAMINOPHEN 500 MG PO TABS
1000.0000 mg | ORAL_TABLET | Freq: Four times a day (QID) | ORAL | 1 refills | Status: DC
  Filled 2023-08-16: qty 120, 15d supply, fill #0

## 2023-08-16 MED ORDER — LORATADINE 10 MG PO TABS
10.0000 mg | ORAL_TABLET | Freq: Every day | ORAL | Status: DC
  Administered 2023-08-16 – 2023-08-18 (×3): 10 mg via ORAL
  Filled 2023-08-16 (×3): qty 1

## 2023-08-16 MED ORDER — 0.9 % SODIUM CHLORIDE (POUR BTL) OPTIME
TOPICAL | Status: DC | PRN
  Administered 2023-08-16: 1000 mL

## 2023-08-16 MED ORDER — MELATONIN 5 MG PO TABS
5.0000 mg | ORAL_TABLET | Freq: Every evening | ORAL | Status: DC | PRN
  Administered 2023-08-17: 5 mg via ORAL
  Filled 2023-08-16: qty 1

## 2023-08-16 MED ORDER — ACETAMINOPHEN 10 MG/ML IV SOLN
INTRAVENOUS | Status: DC | PRN
  Administered 2023-08-16: 1000 mg via INTRAVENOUS

## 2023-08-16 MED ORDER — POLYETHYLENE GLYCOL 3350 17 G PO PACK: 17.0000 g | PACK | Freq: Every day | ORAL | Status: DC | PRN

## 2023-08-16 MED ORDER — MORPHINE SULFATE (PF) 2 MG/ML IV SOLN
2.0000 mg | Freq: Once | INTRAVENOUS | Status: AC
  Administered 2023-08-16: 2 mg via INTRAVENOUS
  Filled 2023-08-16: qty 1

## 2023-08-16 MED ORDER — IBUPROFEN 600 MG PO TABS
600.0000 mg | ORAL_TABLET | Freq: Four times a day (QID) | ORAL | 1 refills | Status: DC | PRN
Start: 2023-08-16 — End: 2023-09-15
  Filled 2023-08-16: qty 120, 30d supply, fill #0

## 2023-08-16 MED ORDER — OXYCODONE HCL 5 MG PO TABS
2.5000 mg | ORAL_TABLET | ORAL | Status: DC | PRN
  Administered 2023-08-16: 5 mg via ORAL
  Filled 2023-08-16: qty 1

## 2023-08-16 MED ORDER — MIDAZOLAM HCL 2 MG/2ML IJ SOLN
INTRAMUSCULAR | Status: AC
  Filled 2023-08-16: qty 2

## 2023-08-16 MED ORDER — ACETAMINOPHEN 10 MG/ML IV SOLN
INTRAVENOUS | Status: AC
Start: 2023-08-16 — End: ?
  Filled 2023-08-16: qty 100

## 2023-08-16 MED ORDER — DOCUSATE SODIUM 100 MG PO CAPS
100.0000 mg | ORAL_CAPSULE | Freq: Two times a day (BID) | ORAL | 2 refills | Status: DC
  Filled 2023-08-16: qty 60, 30d supply, fill #0

## 2023-08-16 SURGICAL SUPPLY — 37 items
BLADE CLIPPER SURG (BLADE) IMPLANT
CANISTER SUCT 3000ML PPV (MISCELLANEOUS) ×1 IMPLANT
CHLORAPREP W/TINT 26 (MISCELLANEOUS) ×1 IMPLANT
COVER SURGICAL LIGHT HANDLE (MISCELLANEOUS) ×1 IMPLANT
DRAPE LAPAROSCOPIC ABDOMINAL (DRAPES) ×1 IMPLANT
DRAPE UNIVERSAL (DRAPES) ×1 IMPLANT
DRAPE WARM FLUID 44X44 (DRAPES) ×1 IMPLANT
DRSG OPSITE POSTOP 4X10 (GAUZE/BANDAGES/DRESSINGS) IMPLANT
DRSG OPSITE POSTOP 4X8 (GAUZE/BANDAGES/DRESSINGS) IMPLANT
ELECT BLADE 6.5 EXT (BLADE) IMPLANT
ELECT CAUTERY BLADE 6.4 (BLADE) ×1 IMPLANT
ELECT REM PT RETURN 9FT ADLT (ELECTROSURGICAL) ×1
ELECTRODE REM PT RTRN 9FT ADLT (ELECTROSURGICAL) ×1 IMPLANT
GLOVE BIO SURGEON STRL SZ 6.5 (GLOVE) ×1 IMPLANT
GLOVE BIOGEL PI IND STRL 6 (GLOVE) ×1 IMPLANT
GOWN STRL REUS W/ TWL LRG LVL3 (GOWN DISPOSABLE) ×2 IMPLANT
HANDLE SUCTION POOLE (INSTRUMENTS) ×1 IMPLANT
KIT BASIN OR (CUSTOM PROCEDURE TRAY) ×1 IMPLANT
KIT TURNOVER KIT B (KITS) ×1 IMPLANT
LIGASURE IMPACT 36 18CM CVD LR (INSTRUMENTS) IMPLANT
NS IRRIG 1000ML POUR BTL (IV SOLUTION) ×2 IMPLANT
PACK GENERAL/GYN (CUSTOM PROCEDURE TRAY) ×1 IMPLANT
PAD ARMBOARD 7.5X6 YLW CONV (MISCELLANEOUS) ×1 IMPLANT
SPONGE T-LAP 18X18 ~~LOC~~+RFID (SPONGE) IMPLANT
STAPLER SKIN PROX WIDE 3.9 (STAPLE) IMPLANT
STAPLER VISISTAT 35W (STAPLE) ×1 IMPLANT
SUCTION POOLE HANDLE (INSTRUMENTS) ×1
SUT PDS AB 1 TP1 54 (SUTURE) IMPLANT
SUT PDS AB 1 TP1 96 (SUTURE) IMPLANT
SUT SILK 2 0 SH CR/8 (SUTURE) ×1 IMPLANT
SUT SILK 2 0 TIES 10X30 (SUTURE) ×1 IMPLANT
SUT SILK 3 0 SH CR/8 (SUTURE) ×1 IMPLANT
SUT SILK 3 0 TIES 10X30 (SUTURE) ×1 IMPLANT
SUT VIC AB 3-0 SH 18 (SUTURE) IMPLANT
TOWEL GREEN STERILE (TOWEL DISPOSABLE) ×1 IMPLANT
TRAY FOLEY MTR SLVR 16FR STAT (SET/KITS/TRAYS/PACK) IMPLANT
YANKAUER SUCT BULB TIP NO VENT (SUCTIONS) IMPLANT

## 2023-08-16 NOTE — ED Provider Notes (Incomplete)
[place] EMERGENCY DEPARTMENT AT [facility] Provider Note   CSN: [id number] Arrival date & time: 08/15/23  2009     History {Add pertinent medical, surgical, social history, OB history to HPI:1} Chief Complaint  Patient presents with  . Abdominal Pain    Jenna [name] is a 68 y.o. female.  Patient presents to the emergency room complaining of abdominal pain.  Patient was reportedly walking in a store at around 4-4 30 this afternoon when she felt a bulge/pop in the left side of her abdomen.  She went to an urgent care who was concerned about hernia and was unable to reduce the hernia and recommended she come to the emergency department to evaluate for possible incarcerated/strangulated hernia.  Patient denies any history of previous hernia.  She does endorse previous laparoscopic hysterectomy.  She is denying nausea, vomiting.  She endorses passing gas since onset of symptoms.  Past medical history significant for atrial fibrillation, gastric ulcer, hypertension, laparoscopic Nissen fundoplication, hernia repair, C-section   Abdominal Pain      Home Medications Prior to Admission medications   Medication Sig Start Date End Date Taking? Authorizing Provider  acetaminophen (TYLENOL) 500 MG tablet Take by mouth as needed for headache.    [provider]  aspirin EC 325 MG tablet Take 1 tablet (325 mg total) by mouth daily. 07/13/23   Christell Constant, MD  butalbital-aspirin-caffeine [facility]) [phone] MG capsule 1 capsule 03/29/20   [provider]  denosumab (PROLIA) 60 MG/ML SOSY injection Inject into the skin every 6 (six) months.    [provider]  fluticasone (FLONASE) 50 MCG/ACT nasal spray Place 2 sprays into the nose daily as needed for allergies.     [provider]  fluticasone (FLONASE) 50 MCG/ACT nasal spray 1 puff in each nostril    [provider]  loratadine-pseudoephedrine (CLARITIN-D 12 HOUR) 5-120  MG tablet Take by mouth daily at 6 (six) AM. 01/28/22   [provider]  omeprazole (PRILOSEC) 20 MG capsule Take 20 mg by mouth daily.    [provider]  rosuvastatin (CRESTOR) 10 MG tablet Take 1 tablet by mouth once weekly 05/14/20   Corky Crafts, MD      Allergies    Bactrim [sulfamethoxazole-trimethoprim], Ezetimibe, Statins, Adhesive [tape], and Latex    Review of Systems   Review of Systems  Gastrointestinal:  Positive for abdominal pain.    Physical Exam Updated Vital Signs BP (!) 170/92   Pulse 87   Temp 97.9 F (36.6 C)   Resp 18   Ht 5\' 3"  (1.6 m)   Wt 59.4 kg   SpO2 100%   BMI 23.20 kg/m  Physical Exam  ED Results / Procedures / Treatments   Labs (all labs ordered are listed, but only abnormal results are displayed) Labs Reviewed  COMPREHENSIVE METABOLIC PANEL - Abnormal; Notable for the following components:      Result Value   Glucose, Bld 113 (*)    BUN 6 (*)    All other components within normal limits  LIPASE, BLOOD  CBC  URINALYSIS, ROUTINE W REFLEX MICROSCOPIC    EKG None  Radiology No results found.  Procedures Procedures  {Document cardiac monitor, telemetry assessment procedure when appropriate:1}  Medications Ordered in ED Medications  fentaNYL (SUBLIMAZE) injection 50 mcg (50 mcg Intravenous Given 08/15/23 2317)  ondansetron (ZOFRAN) injection 4 mg (4 mg Intravenous Given 08/15/23 2310)    ED Course/ Medical Decision Making/ A&P   {  Click here for ABCD2, HEART and other calculatorsREFRESH Note before signing :1}                              Medical Decision Making  ***  {Document critical care time when appropriate:1} {Document review of labs and clinical decision tools ie heart score, Chads2Vasc2 etc:1}  {Document your independent review of radiology images, and any outside records:1} {Document your discussion with family members, caretakers, and with consultants:1} {Document social determinants  of health affecting pt's care:1} {Document your decision making why or why not admission, treatments were needed:1} Final Clinical Impression(s) / ED Diagnoses Final diagnoses:  None    Rx / DC Orders ED Discharge Orders     None

## 2023-08-16 NOTE — ED Provider Notes (Signed)
[place] EMERGENCY DEPARTMENT AT [facility] Provider Note   CSN: [id number] Arrival date & time: 08/15/23  2009     History  Chief Complaint  Patient presents with   Abdominal Pain    Jenna [name] is a 68 y.o. female.  Patient presents to the emergency room complaining of abdominal pain.  Patient was reportedly walking in a store at around 4-4 30 this afternoon when she felt a bulge/pop in the left side of her abdomen.  She went to an urgent care who was concerned about hernia and was unable to reduce the hernia and recommended she come to the emergency department to evaluate for possible incarcerated/strangulated hernia.  Patient denies any history of previous hernia.  She does endorse previous laparoscopic hysterectomy.  She is denying nausea, vomiting.  She endorses passing gas since onset of symptoms.  Past medical history significant for atrial fibrillation, gastric ulcer, hypertension, laparoscopic Nissen fundoplication, hernia repair, C-section   Abdominal Pain      Home Medications Prior to Admission medications   Medication Sig Start Date End Date Taking? Authorizing Provider  acetaminophen (TYLENOL) 500 MG tablet Take by mouth as needed for headache.    [provider]  aspirin EC 325 MG tablet Take 1 tablet (325 mg total) by mouth daily. 07/13/23   Christell Constant, MD  butalbital-aspirin-caffeine [facility]) [phone] MG capsule 1 capsule 03/29/20   [provider]  denosumab (PROLIA) 60 MG/ML SOSY injection Inject into the skin every 6 (six) months.    [provider]  fluticasone (FLONASE) 50 MCG/ACT nasal spray Place 2 sprays into the nose daily as needed for allergies.     [provider]  fluticasone (FLONASE) 50 MCG/ACT nasal spray 1 puff in each nostril    [provider]  loratadine-pseudoephedrine (CLARITIN-D 12 HOUR) 5-120 MG tablet Take by mouth daily at 6 (six) AM. 01/28/22   [provider]  omeprazole (PRILOSEC) 20 MG capsule Take 20 mg by mouth daily.    [provider]  rosuvastatin (CRESTOR) 10 MG tablet Take 1 tablet by mouth once weekly 05/14/20   Corky Crafts, MD      Allergies    Bactrim [sulfamethoxazole-trimethoprim], Ezetimibe, Statins, Adhesive [tape], and Latex    Review of Systems   Review of Systems  Gastrointestinal:  Positive for abdominal pain.    Physical Exam Updated Vital Signs BP (!) 168/93   Pulse 89   Temp (!) 97.4 F (36.3 C) (Oral)   Resp 18   Ht 5\' 3"  (1.6 m)   Wt 59.4 kg   SpO2 100%   BMI 23.20 kg/m  Physical Exam Vitals and nursing note reviewed.  Constitutional:      General: She is not in acute distress.    Appearance: She is well-developed.  HENT:     Head: Normocephalic and atraumatic.  Eyes:     Conjunctiva/sclera: Conjunctivae normal.  Cardiovascular:     Rate and Rhythm: Normal rate and regular rhythm.     Heart sounds: No murmur heard. Pulmonary:     Effort: Pulmonary effort is normal. No respiratory distress.     Breath sounds: Normal breath sounds.  Abdominal:     Palpations: Abdomen is soft.     Tenderness: There is no abdominal tenderness.     Hernia: A hernia (Palpable hernia left and superior to umbilicus, nonreducible. No skin changes, erythema, warmth) is present.  Musculoskeletal:        General:  No swelling.     Cervical back: Neck supple.  Skin:    General: Skin is warm and dry.     Capillary Refill: Capillary refill takes less than 2 seconds.  Neurological:     Mental Status: She is alert.  Psychiatric:        Mood and Affect: Mood normal.     ED Results / Procedures / Treatments   Labs (all labs ordered are listed, but only abnormal results are displayed) Labs Reviewed  COMPREHENSIVE METABOLIC PANEL - Abnormal; Notable for the following components:      Result Value   Glucose, Bld 113 (*)    BUN 6 (*)    All other components within normal limits  LIPASE,  BLOOD  CBC  URINALYSIS, ROUTINE W REFLEX MICROSCOPIC  I-STAT CG4 LACTIC ACID, ED    EKG None  Radiology CT ABDOMEN PELVIS W CONTRAST Result Date: 08/16/2023 CLINICAL DATA:  Acute abdominal pain EXAM: CT ABDOMEN AND PELVIS WITH CONTRAST TECHNIQUE: Multidetector CT imaging of the abdomen and pelvis was performed using the standard protocol following bolus administration of intravenous contrast. RADIATION DOSE REDUCTION: This exam was performed according to the departmental dose-optimization program which includes automated exposure control, adjustment of the mA and/or kV according to patient size and/or use of iterative reconstruction technique. CONTRAST:  75mL OMNIPAQUE IOHEXOL 350 MG/ML SOLN COMPARISON:  03/12/2018 FINDINGS: Lower chest: No acute abnormality. Hepatobiliary: Gallbladder is well distended with a dependent poorly calcified filling defect. This may represent a small polyps or poorly calcified stone. Liver is within normal limits. Pancreas: Unremarkable. No pancreatic ductal dilatation or surrounding inflammatory changes. Spleen: Normal in size without focal abnormality. Adrenals/Urinary Tract: Adrenal glands are within normal limits. Kidneys are well visualized bilaterally. Peripelvic renal cysts are noted on the left. No follow-up is recommended. No obstructive changes are seen. The bladder is well distended. Stomach/Bowel: Scattered diverticular change is noted without evidence of diverticulitis. The appendix is not well visualized. No inflammatory changes to suggest appendicitis are noted. Stomach is decompressed. Changes of prior fundoplication are seen. There is a herniated loop of small bowel identified in and anterior supraumbilical hernia. The small bowel loops proximal to this are mildly dilated suggesting early partial small bowel obstruction. The more distal small bowel loops are within normal limits. Vascular/Lymphatic: No significant vascular findings are present. No enlarged  abdominal or pelvic lymph nodes. Reproductive: Uterus and bilateral adnexa are unremarkable. Other: No abdominal wall hernia or abnormality. No abdominopelvic ascites. Musculoskeletal: No acute or significant osseous findings. IMPRESSION: Ventral abdominal wall hernia containing a single loop of herniated small bowel. This causes early partial small bowel obstruction proximal to this herniation. Diverticulosis without diverticulitis. Poorly calcified filling defect within the gallbladder possibly representing a polyp or stone. Electronically Signed   By: Alcide Clever M.D.   On: 08/16/2023 01:14    Procedures Procedures    Medications Ordered in ED Medications  morphine (PF) 2 MG/ML injection 2 mg (has no administration in time range)  fentaNYL (SUBLIMAZE) injection 50 mcg (50 mcg Intravenous Given 08/15/23 2317)  ondansetron (ZOFRAN) injection 4 mg (4 mg Intravenous Given 08/15/23 2310)  iohexol (OMNIPAQUE) 350 MG/ML injection 75 mL (75 mLs Intravenous Contrast Given 08/16/23 0100)    ED Course/ Medical Decision Making/ A&P                                 Medical Decision Making Risk Prescription drug  management. Decision regarding hospitalization.   This patient presents to the ED for concern of possible hernia, this involves an extensive number of treatment options, and is a complaint that carries with it a high risk of complications and morbidity.  The differential diagnosis includes reducible hernia, incarcerated hernia, strangulated hernia, others   Co morbidities that complicate the patient evaluation  Previous laparoscopic abdominal surgery   Additional history obtained:  Additional history obtained from family External records from outside source obtained and reviewed including urgent care notes   Lab Tests:  I Ordered, and personally interpreted labs.  The pertinent results include: Grossly unremarkable CMP, CBC, lipase, UA; initial lactic 0.7   Imaging Studies  ordered:  I ordered imaging studies including CT abdomen pelvis with contrast I independently visualized and interpreted imaging which showed  Ventral abdominal wall hernia containing a single loop of herniated  small bowel. This causes early partial small bowel obstruction  proximal to this herniation.    Diverticulosis without diverticulitis.    Poorly calcified filling defect within the gallbladder possibly  representing a polyp or stone.   I agree with the radiologist interpretation   Consultations Obtained:  I requested consultation with the general surgeon, Dr. Bedelia Person,  and discussed lab and imaging findings as well as pertinent plan - they recommend: Plan on surgical intervention, request medicine admission due to comorbidities  I requested consultation with the hospitalist. Patient was off the floor prior to consult. Hospitalist requests reconsultation once out of OR   Problem List / ED Course / Critical interventions / Medication management   I ordered medication including fentanyl and morphine for pain, Zofran for nausea Reevaluation of the patient after these medicines showed that the patient improved I have reviewed the patients home medicines and have made adjustments as needed   Test / Admission - Considered:  Patient with nonreducible ventral abdominal wall hernia containing loop of herniated small bowel with early partial small bowel obstruction.  Patient needs admission for medical optimization and for surgical intervention.         Final Clinical Impression(s) / ED Diagnoses Final diagnoses:  Ventral hernia with bowel obstruction    Rx / DC Orders ED Discharge Orders     None         Pamala Duffel 08/16/23 0259    Dione Booze, MD 08/16/23 [phone]

## 2023-08-16 NOTE — Anesthesia Preprocedure Evaluation (Addendum)
Anesthesia Evaluation  Patient identified by MRN, date of birth, ID band Patient awake    Airway Mallampati: II  TM Distance: >3 FB Neck ROM: Full    Dental  (+) Teeth Intact, Dental Advisory Given 2 crowns on molars:   Pulmonary  On Doxycyline per patient for sinus infection- on day 2 of 7   Pulmonary exam normal breath sounds clear to auscultation       Cardiovascular hypertension, Normal cardiovascular exam+ dysrhythmias (PAF) Atrial Fibrillation  Rhythm:Regular Rate:Normal  Paroxysmal AFib on Holter monitor Nov 2024; daily ASA   Neuro/Psych  Neuromuscular disease    GI/Hepatic PUD,GERD  Medicated,,Small bowel obstruction    Endo/Other    Renal/GU      Musculoskeletal  (+) Arthritis ,    Abdominal   Peds  Hematology   Anesthesia Other Findings   Reproductive/Obstetrics                              Anesthesia Physical Anesthesia Plan  ASA: 3 and emergent  Anesthesia Plan: General   Post-op Pain Management: Ofirmev IV (intra-op)* and Toradol IV (intra-op)*   Induction: Intravenous, Rapid sequence and Cricoid pressure planned  PONV Risk Score and Plan: 4 or greater and Dexamethasone, Ondansetron and Treatment may vary due to age or medical condition  Airway Management Planned: Oral ETT  Additional Equipment:   Intra-op Plan:   Post-operative Plan: Extubation in OR and Possible Post-op intubation/ventilation  Informed Consent: I have reviewed the patients History and Physical, chart, labs and discussed the procedure including the risks, benefits and alternatives for the proposed anesthesia with the patient or authorized representative who has indicated his/her understanding and acceptance.     Dental advisory given  Plan Discussed with: CRNA  Anesthesia Plan Comments:          Anesthesia Quick Evaluation

## 2023-08-16 NOTE — Progress Notes (Signed)
PROGRESS NOTE  Jenna [name] Batch NWG:[id number] DOB: 1955/12/02   PCP: [facility] [place]  Patient is from: Home.  Independently ambulates at baseline.  Lives with husband.  DOA: 08/15/2023 LOS: 0  Chief complaints Chief Complaint  Patient presents with   Abdominal Pain     Brief Narrative / Interim history: 68 year old F with PMH of PAF not on AC, HLD, GERD and sinusitis present to urgent care with generalized abdominal pain and bulging from abdominal wall.  She was directed to ED for further evaluation.  CT abdomen and pelvis raises concern for incisional abdominal wall hernia with bowel incarceration.  She was taken to the OR emergently and had ex lap with reduction of hernia on 1/26.   Subjective: Seen and examined earlier this morning.  No major events overnight of this morning.  Has no complaints other than nasal congestion that she attributes to his sinus infection.  She states she was recently started on doxycycline about 3 days ago.  Previously treated successfully with p.o. Augmentin.  Denies nausea, vomiting or abdominal pain.  Does not remember passing gas.  Objective: Vitals:   08/16/23 0530 08/16/23 0545 08/16/23 0602 08/16/23 0749  BP: 131/70 129/70 128/84 112/62  Pulse: 74 83 82 86  Resp: 11 11  18   Temp:  97.7 F (36.5 C) 98.4 F (36.9 C) 98.3 F (36.8 C)  TempSrc:   Oral Oral  SpO2: 99% 98% 100% 98%  Weight:      Height:        Examination:  GENERAL: No apparent distress.  Nontoxic. HEENT: MMM.  Vision and hearing grossly intact.  Nasal congestion.  No tenderness to percussion over sinuses. NECK: Supple.  No apparent JVD.  RESP:  No IWOB.  Fair aeration bilaterally. CVS:  RRR. Heart sounds normal.  ABD/GI/GU: BS+. Abd soft, NTND.  Honeycomb dressing DCI. MSK/EXT:  Moves extremities. No apparent deformity. No edema.  SKIN: no apparent skin lesion or wound NEURO: Awake, alert and oriented appropriately.  No apparent focal neuro  deficit. PSYCH: Calm. Normal affect.   Procedures:  1/26-ex lap with abdominal hernia reduction by Dr. Bedelia Person  Microbiology summarized: None  Assessment and plan: Abdominal hernia with bowel incarceration -S/p ex lap and hernia reduction by Dr. Bedelia Person on 1/26. -Per general surgery    Paroxysmal A-fib: Currently in sinus rhythm.  Not on anticoagulation but takes full dose aspirin.  -Advised to avoid over-the-counter decongestants.  She takes Claritin-D.  Acute sinusitis: Recently started on doxycycline. -Start Augmentin, Flonase no spray, saline nose spray and plain Claritin  GERD -PPI   Hyperlipidemia -Statin  Body mass index is 23.2 kg/m.           DVT prophylaxis:  SCDs Start: 08/16/23 0352  Code Status: Full code Family Communication: Updated patient's husband at bedside Level of care: Telemetry Surgical Status is: Inpatient Remains inpatient appropriate because: Due to incarcerated abdominal pain   Final disposition: Home Consultants:  General surgery  35 minutes with more than 50% spent in reviewing records, counseling patient/family and coordinating care.   Sch Meds:  Scheduled Meds:  amoxicillin-clavulanate  1 tablet Oral Q12H   fentaNYL       fluticasone  2 spray Each Nare Daily   ketorolac  15 mg Intravenous Q6H   loratadine  10 mg Oral Daily   methocarbamol (ROBAXIN) injection  1,000 mg Intravenous Q8H   pantoprazole  40 mg Oral Daily   Continuous Infusions:  acetaminophen 1,000 mg (08/16/23  0700)   lactated ringers 50 mL/hr at 08/16/23 0701   PRN Meds:.acetaminophen, fentaNYL, melatonin, morphine injection, oxyCODONE, polyethylene glycol, sodium chloride  Antimicrobials: Anti-infectives (From admission, onward)    Start     Dose/Rate Route Frequency Ordered Stop   08/16/23 1045  amoxicillin-clavulanate (AUGMENTIN) 875-125 MG per tablet 1 tablet        1 tablet Oral Every 12 hours 08/16/23 0949 08/23/23 0959        I have  personally reviewed the following labs and images: CBC: Recent Labs  Lab 08/15/23 2138  WBC 6.4  HGB 13.7  HCT 41.7  MCV 97.4  PLT 292   BMP &GFR Recent Labs  Lab 08/15/23 2138  NA 139  K 3.6  CL 104  CO2 25  GLUCOSE 113*  BUN 6*  CREATININE 0.58  CALCIUM 9.6   Estimated Creatinine Clearance: 54.9 mL/min (by C-G formula based on SCr of 0.58 mg/dL). Liver & Pancreas: Recent Labs  Lab 08/15/23 2138  AST 19  ALT 18  ALKPHOS 69  BILITOT 0.5  PROT 6.8  ALBUMIN 3.8   Recent Labs  Lab 08/15/23 2138  LIPASE 38   No results for input(s): "AMMONIA" in the last 168 hours. Diabetic: No results for input(s): "HGBA1C" in the last 72 hours. No results for input(s): "GLUCAP" in the last 168 hours. Cardiac Enzymes: No results for input(s): "CKTOTAL", "CKMB", "CKMBINDEX", "TROPONINI" in the last 168 hours. No results for input(s): "PROBNP" in the last 8760 hours. Coagulation Profile: No results for input(s): "INR", "PROTIME" in the last 168 hours. Thyroid Function Tests: No results for input(s): "TSH", "T4TOTAL", "FREET4", "T3FREE", "THYROIDAB" in the last 72 hours. Lipid Profile: No results for input(s): "CHOL", "HDL", "LDLCALC", "TRIG", "CHOLHDL", "LDLDIRECT" in the last 72 hours. Anemia Panel: No results for input(s): "VITAMINB12", "FOLATE", "FERRITIN", "TIBC", "IRON", "RETICCTPCT" in the last 72 hours. Urine analysis:    Component Value Date/Time   COLORURINE YELLOW 08/15/2023 2146   APPEARANCEUR CLEAR 08/15/2023 2146   LABSPEC 1.012 08/15/2023 2146   PHURINE 7.0 08/15/2023 2146   GLUCOSEU NEGATIVE 08/15/2023 2146   HGBUR NEGATIVE 08/15/2023 2146   BILIRUBINUR NEGATIVE 08/15/2023 2146   KETONESUR NEGATIVE 08/15/2023 2146   PROTEINUR NEGATIVE 08/15/2023 2146   UROBILINOGEN 0.2 02/26/2012 2011   NITRITE NEGATIVE 08/15/2023 2146   LEUKOCYTESUR NEGATIVE 08/15/2023 2146   Sepsis Labs: Invalid input(s): "PROCALCITONIN", "LACTICIDVEN"  Microbiology: No results  found for this or any previous visit (from the past 240 hours).  Radiology Studies: CT ABDOMEN PELVIS W CONTRAST Result Date: 08/16/2023 CLINICAL DATA:  Acute abdominal pain EXAM: CT ABDOMEN AND PELVIS WITH CONTRAST TECHNIQUE: Multidetector CT imaging of the abdomen and pelvis was performed using the standard protocol following bolus administration of intravenous contrast. RADIATION DOSE REDUCTION: This exam was performed according to the departmental dose-optimization program which includes automated exposure control, adjustment of the mA and/or kV according to patient size and/or use of iterative reconstruction technique. CONTRAST:  75mL OMNIPAQUE IOHEXOL 350 MG/ML SOLN COMPARISON:  03/12/2018 FINDINGS: Lower chest: No acute abnormality. Hepatobiliary: Gallbladder is well distended with a dependent poorly calcified filling defect. This may represent a small polyps or poorly calcified stone. Liver is within normal limits. Pancreas: Unremarkable. No pancreatic ductal dilatation or surrounding inflammatory changes. Spleen: Normal in size without focal abnormality. Adrenals/Urinary Tract: Adrenal glands are within normal limits. Kidneys are well visualized bilaterally. Peripelvic renal cysts are noted on the left. No follow-up is recommended. No obstructive changes are seen. The bladder is well  distended. Stomach/Bowel: Scattered diverticular change is noted without evidence of diverticulitis. The appendix is not well visualized. No inflammatory changes to suggest appendicitis are noted. Stomach is decompressed. Changes of prior fundoplication are seen. There is a herniated loop of small bowel identified in and anterior supraumbilical hernia. The small bowel loops proximal to this are mildly dilated suggesting early partial small bowel obstruction. The more distal small bowel loops are within normal limits. Vascular/Lymphatic: No significant vascular findings are present. No enlarged abdominal or pelvic lymph  nodes. Reproductive: Uterus and bilateral adnexa are unremarkable. Other: No abdominal wall hernia or abnormality. No abdominopelvic ascites. Musculoskeletal: No acute or significant osseous findings. IMPRESSION: Ventral abdominal wall hernia containing a single loop of herniated small bowel. This causes early partial small bowel obstruction proximal to this herniation. Diverticulosis without diverticulitis. Poorly calcified filling defect within the gallbladder possibly representing a polyp or stone. Electronically Signed   By: Alcide Clever M.D.   On: 08/16/2023 01:14      Devesh Monforte T. Quint [facility]nut [facility]  If 7PM-7AM, please contact night-coverage [email or web address] 08/16/2023, 11:35 AM

## 2023-08-16 NOTE — Progress Notes (Signed)
Progress Note  * Day of Surgery *  Subjective: Having some abdominal soreness but overall feeling much better than preop. No n/v but no flatus or BM. Has not ambulated yet  Husband at bedside Objective: Vital signs in last 24 hours: Temp:  [97.4 F (36.3 C)-98.4 F (36.9 C)] 98.3 F (36.8 C) (01/26 0749) Pulse Rate:  [74-96] 86 (01/26 0749) Resp:  [11-20] 18 (01/26 0749) BP: (112-170)/(62-107) 112/62 (01/26 0749) SpO2:  [96 %-100 %] 98 % (01/26 0749) Weight:  [59.4 kg] 59.4 kg (01/25 2115)    Intake/Output from previous day: 01/25 0701 - 01/26 0700 In: 900 [I.V.:800; IV Piggyback:100] Out: 220 [Urine:200; Blood:20] Intake/Output this shift: No intake/output data recorded.  AO:ZHYQMVH: pleasant, WD, female who is laying in bed in NAD Lungs: Respiratory effort nonlabored Abd: soft, mild distension. Mild appropriate TTP around midline which is cdi with staples and honeycomb MSK: all 4 extremities are symmetrical with no cyanosis, clubbing, or edema. Skin: warm and dry Psych: A&Ox3 with an appropriate affect.    Lab Results:  Recent Labs    08/15/23 2138  WBC 6.4  HGB 13.7  HCT 41.7  PLT 292   BMET Recent Labs    08/15/23 2138  NA 139  K 3.6  CL 104  CO2 25  GLUCOSE 113*  BUN 6*  CREATININE 0.58  CALCIUM 9.6   PT/INR No results for input(s): "LABPROT", "INR" in the last 72 hours. CMP     Component Value Date/Time   NA 139 08/15/2023 2138   NA 143 12/28/2012 1459   K 3.6 08/15/2023 2138   K 3.8 12/28/2012 1459   CL 104 08/15/2023 2138   CL 108 (H) 12/28/2012 1459   CO2 25 08/15/2023 2138   CO2 28 12/28/2012 1459   GLUCOSE 113 (H) 08/15/2023 2138   GLUCOSE 65 (L) 12/28/2012 1459   BUN 6 (L) 08/15/2023 2138   BUN 12.1 12/28/2012 1459   CREATININE 0.58 08/15/2023 2138   CREATININE 0.8 12/28/2012 1459   CALCIUM 9.6 08/15/2023 2138   CALCIUM 9.4 12/28/2012 1459   PROT 6.8 08/15/2023 2138   PROT 6.5 07/09/2020 1003   PROT 6.6 12/28/2012 1459    ALBUMIN 3.8 08/15/2023 2138   ALBUMIN 4.2 07/09/2020 1003   ALBUMIN 3.6 12/28/2012 1459   AST 19 08/15/2023 2138   AST 16 12/28/2012 1459   ALT 18 08/15/2023 2138   ALT 10 12/28/2012 1459   ALKPHOS 69 08/15/2023 2138   ALKPHOS 73 12/28/2012 1459   BILITOT 0.5 08/15/2023 2138   BILITOT 0.5 07/09/2020 1003   BILITOT 0.43 12/28/2012 1459   GFRNONAA >60 08/15/2023 2138   GFRAA >60 08/17/2015 0345   Lipase     Component Value Date/Time   LIPASE 38 08/15/2023 2138       Studies/Results: CT ABDOMEN PELVIS W CONTRAST Result Date: 08/16/2023 CLINICAL DATA:  Acute abdominal pain EXAM: CT ABDOMEN AND PELVIS WITH CONTRAST TECHNIQUE: Multidetector CT imaging of the abdomen and pelvis was performed using the standard protocol following bolus administration of intravenous contrast. RADIATION DOSE REDUCTION: This exam was performed according to the departmental dose-optimization program which includes automated exposure control, adjustment of the mA and/or kV according to patient size and/or use of iterative reconstruction technique. CONTRAST:  75mL OMNIPAQUE IOHEXOL 350 MG/ML SOLN COMPARISON:  03/12/2018 FINDINGS: Lower chest: No acute abnormality. Hepatobiliary: Gallbladder is well distended with a dependent poorly calcified filling defect. This may represent a small polyps or poorly calcified stone. Liver is  within normal limits. Pancreas: Unremarkable. No pancreatic ductal dilatation or surrounding inflammatory changes. Spleen: Normal in size without focal abnormality. Adrenals/Urinary Tract: Adrenal glands are within normal limits. Kidneys are well visualized bilaterally. Peripelvic renal cysts are noted on the left. No follow-up is recommended. No obstructive changes are seen. The bladder is well distended. Stomach/Bowel: Scattered diverticular change is noted without evidence of diverticulitis. The appendix is not well visualized. No inflammatory changes to suggest appendicitis are noted. Stomach is  decompressed. Changes of prior fundoplication are seen. There is a herniated loop of small bowel identified in and anterior supraumbilical hernia. The small bowel loops proximal to this are mildly dilated suggesting early partial small bowel obstruction. The more distal small bowel loops are within normal limits. Vascular/Lymphatic: No significant vascular findings are present. No enlarged abdominal or pelvic lymph nodes. Reproductive: Uterus and bilateral adnexa are unremarkable. Other: No abdominal wall hernia or abnormality. No abdominopelvic ascites. Musculoskeletal: No acute or significant osseous findings. IMPRESSION: Ventral abdominal wall hernia containing a single loop of herniated small bowel. This causes early partial small bowel obstruction proximal to this herniation. Diverticulosis without diverticulitis. Poorly calcified filling defect within the gallbladder possibly representing a polyp or stone. Electronically Signed   By: Alcide Clever M.D.   On: 08/16/2023 01:14    Anti-infectives: Anti-infectives (From admission, onward)    Start     Dose/Rate Route Frequency Ordered Stop   08/16/23 1045  amoxicillin-clavulanate (AUGMENTIN) 875-125 MG per tablet 1 tablet        1 tablet Oral Every 12 hours 08/16/23 0949 08/23/23 0959        Assessment/Plan incarcerated incisional hernia   POD0 s/p ex lap Dr. Bedelia Person 1/26  - pain controlled - continue sips chips and AROBF - encouraged ambulation  FEN: NPO sips/chips, IVF ID: augmentin (sinusitis) VTE: SCDs    LOS: 0 days   Eric Form, [facility] Surgery 08/16/2023, 10:43 AM Please see Amion for pager number during day hours 7:00am-4:30pm

## 2023-08-16 NOTE — Op Note (Signed)
   Operative Note   Date: 08/16/2023  Procedure: open primary incisional hernia repair   Pre-op diagnosis: incarcerated incisional hernia, 4cm Post-op diagnosis: same  Indication and clinical history: The patient is a 68 y.o. year old female with an incarcerated incisional hernia     Surgeon: Diamantina Monks, MD  Anesthesiologist: Desmond Lope, MD Anesthesia: General  Findings:  Specimen: hernia sac EBL: 10cc Drains/Implants: none  Disposition: PACU - hemodynamically stable.  Description of procedure: The patient was positioned supine on the operating room table. General anesthetic induction and intubation were uneventful. Foley catheter insertion was performed and was atraumatic. Time-out was performed verifying correct patient, procedure, signature of informed consent, and administration of pre-operative antibiotics. The abdomen was prepped and draped in the usual sterile fashion.  A midline incision was made and deepened to the level of the fascia. The hernia contents were able to be reduced after extension of the fascial defect. The hernia sac was excised and sent as a permanent specimen to pathology. The small bowel was run from ligament of Treitz to ileocecal valve. An area of mid-jejunum was suspected to be the incarcerated segment as it was mildly erythematous, but well perfused and very clearly viable. The fascia was closed with #1 PDS suture. The skin was closed with staples.   Sterile dressings were applied. All sponge and instrument counts were correct at the conclusion of the procedure. The patient was awakened from anesthesia, extubated uneventfully, and transported to the PACU in good condition. There were no complications.   Attempt made to reach patient's husband at phone number in the chart that I personally confirmed pre-op was correct. There was no answer. I left a voicemail.    Diamantina Monks, MD General and Trauma Surgery [facility]

## 2023-08-16 NOTE — Consult Note (Addendum)
Reason for Consult/Chief Complaint: incarcerated incisional hernia Consultant: Marcelline Deist, PA  Jenna [name] is an 68 y.o. female.   HPI: 42F with acute onset of firm protrusion of abdomen and bloating at 1600 yesterday while shopping. Went to UC who directed her here. ASA81 after wearing a Holter monitor  and having a single episode of AF.   Past Medical History:  Diagnosis Date   Allergic rhinitis    Atrial fibrillation (HCC) 08/22/2013   Cataract    Colon polyp 07/21/2006   on last colonoscopy with Dr. Loreta Ave in 2008   Deviated nasal septum    Diverticulosis 07/21/2006   on last colonoscopy with Dr. Loreta Ave in 2008   Gastric ulcer with hemorrhage    GERD (gastroesophageal reflux disease)    Hypercalcemia    Hypercholesteremia    Hyperlipidemia    Hypertension 07/29/2018   Insomnia    Iron deficiency anemia    After ulcer in 2013, resolved   Osteoarthritis    Paroxysmal atrial fibrillation (HCC)    Statin myopathy 05/14/2020   Vitamin B12 deficiency     Past Surgical History:  Procedure Laterality Date   CESAREAN SECTION     ESOPHAGOGASTRODUODENOSCOPY N/A 08/10/2015   Procedure: ESOPHAGOGASTRODUODENOSCOPY (EGD);  Surgeon: Jeani Hawking, MD;  [facility] ENDOSCOPY;  Service: Endoscopy;  Laterality: N/A;   HIATAL HERNIA REPAIR  08/15/2015   LAPAROSCOPIC NISSEN FUNDOPLICATION  08/15/2015   LAPAROSCOPIC NISSEN FUNDOPLICATION N/A 08/15/2015   Procedure: LAPAROSCOPIC HIATAL HERNIA REPAIR AND NISSEN FUNDOPLICATION;  Surgeon: Axel Filler, MD;  Location: [facility] OR;  Service: General;  Laterality: N/A;   NASAL SINUS SURGERY     TONSILLECTOMY      Family History  Problem Relation Age of Onset   Hypertension Mother    Hepatitis Mother    Heart attack Father    Hydrocephalus Father    Breast cancer Maternal Aunt 81    Social History:  reports that she has never smoked. She has never used smokeless tobacco. She reports current alcohol use. She reports that she does not  use drugs.  Allergies:  Allergies  Allergen Reactions   Bactrim [Sulfamethoxazole-Trimethoprim] Other (See Comments)    Canker sores in her mouth.   Ezetimibe     Stomach cramping   Statins     Myalgias with pravastatin 40mg  daily, simvastatin 20mg  daily, rosuvastatin 10mg  twice weekly   Adhesive [Tape] Rash and Other (See Comments)    Burns to the skin   Latex Rash and Other (See Comments)    Burns to skin    Medications: I have reviewed the patient's current medications.  Results for orders placed or performed during the hospital encounter of 08/15/23 (from the past 48 hours)  Lipase, blood     Status: None   Collection Time: 08/15/23  9:38 PM  Result Value Ref Range   Lipase 38 11 - 51 U/L    Comment: Performed at [facility], [address] [place], [place] [address]  Comprehensive metabolic panel     Status: Abnormal   Collection Time: 08/15/23  9:38 PM  Result Value Ref Range   Sodium 139 135 - 145 mmol/L   Potassium 3.6 3.5 - 5.1 mmol/L   Chloride 104 98 - 111 mmol/L   CO2 25 22 - 32 mmol/L   Glucose, Bld 113 (H) 70 - 99 mg/dL    Comment: Glucose reference range applies only to samples taken after fasting for at least 8 hours.  BUN 6 (L) 8 - 23 mg/dL   Creatinine, Ser 0.98 0.44 - 1.00 mg/dL   Calcium 9.6 8.9 - 11.9 mg/dL   Total Protein 6.8 6.5 - 8.1 g/dL   Albumin 3.8 3.5 - 5.0 g/dL   AST 19 15 - 41 U/L   ALT 18 0 - 44 U/L   Alkaline Phosphatase 69 38 - 126 U/L   Total Bilirubin 0.5 0.0 - 1.2 mg/dL   GFR, Estimated >14 >78 mL/min    Comment: (NOTE) Calculated using the CKD-EPI Creatinine Equation (2021)    Anion gap 10 5 - 15    Comment: Performed at [facility], [address], [place], [place] [address]  CBC     Status: None   Collection Time: 08/15/23  9:38 PM  Result Value Ref Range   WBC 6.4 4.0 - 10.5 K/uL   RBC 4.28 3.87 - 5.11 MIL/uL   Hemoglobin 13.7 12.0 - 15.0 g/dL   HCT 13.0 86.5 - 78.4 %   MCV 97.4 80.0 - 100.0 fL   MCH  32.0 26.0 - 34.0 pg   MCHC 32.9 30.0 - 36.0 g/dL   RDW 69.6 29.5 - 28.4 %   Platelets 292 150 - 400 K/uL   nRBC 0.0 0.0 - 0.2 %    Comment: Performed at [facility], [address], [place], [place] [address]  Urinalysis, Routine w reflex microscopic -Urine, Clean Catch     Status: None   Collection Time: 08/15/23  9:46 PM  Result Value Ref Range   Color, Urine YELLOW YELLOW   APPearance CLEAR CLEAR   Specific Gravity, Urine 1.012 1.005 - 1.030   pH 7.0 5.0 - 8.0   Glucose, UA NEGATIVE NEGATIVE mg/dL   Hgb urine dipstick NEGATIVE NEGATIVE   Bilirubin Urine NEGATIVE NEGATIVE   Ketones, ur NEGATIVE NEGATIVE mg/dL   Protein, ur NEGATIVE NEGATIVE mg/dL   Nitrite NEGATIVE NEGATIVE   Leukocytes,Ua NEGATIVE NEGATIVE    Comment: Performed at [facility], [address], [place], [place] [address]    CT ABDOMEN PELVIS W CONTRAST Result Date: 08/16/2023 CLINICAL DATA:  Acute abdominal pain EXAM: CT ABDOMEN AND PELVIS WITH CONTRAST TECHNIQUE: Multidetector CT imaging of the abdomen and pelvis was performed using the standard protocol following bolus administration of intravenous contrast. RADIATION DOSE REDUCTION: This exam was performed according to the departmental dose-optimization program which includes automated exposure control, adjustment of the mA and/or kV according to patient size and/or use of iterative reconstruction technique. CONTRAST:  75mL OMNIPAQUE IOHEXOL 350 MG/ML SOLN COMPARISON:  03/12/2018 FINDINGS: Lower chest: No acute abnormality. Hepatobiliary: Gallbladder is well distended with a dependent poorly calcified filling defect. This may represent a small polyps or poorly calcified stone. Liver is within normal limits. Pancreas: Unremarkable. No pancreatic ductal dilatation or surrounding inflammatory changes. Spleen: Normal in size without focal abnormality. Adrenals/Urinary Tract: Adrenal glands are within normal limits. Kidneys are well visualized bilaterally.  Peripelvic renal cysts are noted on the left. No follow-up is recommended. No obstructive changes are seen. The bladder is well distended. Stomach/Bowel: Scattered diverticular change is noted without evidence of diverticulitis. The appendix is not well visualized. No inflammatory changes to suggest appendicitis are noted. Stomach is decompressed. Changes of prior fundoplication are seen. There is a herniated loop of small bowel identified in and anterior supraumbilical hernia. The small bowel loops proximal to this are mildly dilated suggesting early partial small bowel obstruction. The more distal small bowel loops  are within normal limits. Vascular/Lymphatic: No significant vascular findings are present. No enlarged abdominal or pelvic lymph nodes. Reproductive: Uterus and bilateral adnexa are unremarkable. Other: No abdominal wall hernia or abnormality. No abdominopelvic ascites. Musculoskeletal: No acute or significant osseous findings. IMPRESSION: Ventral abdominal wall hernia containing a single loop of herniated small bowel. This causes early partial small bowel obstruction proximal to this herniation. Diverticulosis without diverticulitis. Poorly calcified filling defect within the gallbladder possibly representing a polyp or stone. Electronically Signed   By: Alcide Clever M.D.   On: 08/16/2023 01:14    ROS 10 point review of systems is negative except as listed above in HPI.   Physical Exam Blood pressure (!) 168/93, pulse 89, temperature (!) 97.4 F (36.3 C), temperature source Oral, resp. rate 18, height 5\' 3"  (1.6 m), weight 59.4 kg, SpO2 100%. Constitutional: well-developed, well-nourished HEENT: pupils equal, round, reactive to light, 2mm b/l, moist conjunctiva, external inspection of ears and nose normal, hearing intact Oropharynx: normal oropharyngeal mucosa, normal dentition Neck: no thyromegaly, trachea midline, no midline cervical tenderness to palpation Chest: breath sounds equal  bilaterally, normal respiratory effort, no midline or lateral chest wall tenderness to palpation/deformity Abdomen: soft, firm tender protrusion c/w incarcerated [facility]-nonreducible, no bruising, no hepatosplenomegaly GU: normal female genitalia  Skin: warm, dry, no rashes Psych: normal memory, normal mood/affect     Assessment/Plan: Incarcerated incisional hernia  - plan ex-lap, possible bowel resection. Informed consent was obtained after detailed explanation of risks, including bleeding, infection, need for bowel resection, abscess, staple line leak, injury to surrounding structures, and hernia recurrence. All questions answered to the patient's satisfaction. Discussed post-op ileus and general post-op course.  FEN - strict NPO DVT - SCDs, LMWH Dispo -  to be determined post-op     Diamantina Monks, MD General and Trauma Surgery [facility]

## 2023-08-16 NOTE — Anesthesia Procedure Notes (Signed)
Procedure Name: Intubation Date/Time: 08/16/2023 3:05 AM  Performed by: Edmonia Caprio, CRNAPre-anesthesia Checklist: Patient identified, Emergency Drugs available, Suction available, Timeout performed and Patient being monitored Patient Re-evaluated:Patient Re-evaluated prior to induction Oxygen Delivery Method: Circle system utilized Preoxygenation: Pre-oxygenation with 100% oxygen Induction Type: IV induction and Rapid sequence Laryngoscope Size: Miller and 2 Grade View: Grade II Tube type: Oral Tube size: 7.0 mm Number of attempts: 1 Airway Equipment and Method: Stylet Placement Confirmation: ETT inserted through vocal cords under direct vision, positive ETCO2 and breath sounds checked- equal and bilateral Secured at: 23 cm Tube secured with: Tape Dental Injury: Teeth and Oropharynx as per pre-operative assessment

## 2023-08-16 NOTE — Discharge Instructions (Addendum)
[facility]  SURGERY: POST OP INSTRUCTIONS Always review your discharge instruction sheet given to you by the facility where your surgery was performed. IF YOU HAVE DISABILITY OR FAMILY LEAVE FORMS, YOU MUST BRING THEM TO THE OFFICE FOR PROCESSING.   DO NOT GIVE THEM TO YOUR DOCTOR.  PAIN CONTROL  Pain regimen: take over-the-counter tylenol (acetaminophen) 1000mg  every six hours, the prescription ibuprofen (600mg ) every six hours and the robaxin (methocarbamol) 750mg  every six hours. With all three of these, you should be taking something every two hours. Example: tylenol (acetaminophen) at 8am, ibuprofen at 10am, robaxin (methocarbamol) at 12pm, tylenol (acetaminophen) again at 2pm, ibuprofen again at 4pm, robaxin (methocarbamol) at 6pm. You also have a prescription for oxycodone, which should be taken if the tylenol (acetaminophen), ibuprofen, and robaxin (methocarbamol) are not enough to control your pain. You may take the oxycodone as frequently as every four hours as needed, but if you are taking the other medications as above, you should not need the oxycodone this frequently. You have also been given a prescription for colace (docusate) which is a stool softener. Please take this as prescribed because the oxycodone can cause constipation and the colace (docusate) will minimize or prevent constipation. Do not drive while taking or under the influence of the oxycodone as it is a narcotic medication. Use ice packs to help control pain. If you need a refill on your pain medication, please contact your pharmacy.  They will contact our office to request authorization. Prescriptions will not be filled after 5pm or on week-ends.  HOME MEDICATIONS Take your usually prescribed medications unless otherwise directed.  DIET You should follow a light diet the first few days after arrival home.  Be sure to include lots of fluids daily.  Do not consume alcohol while taking oxycodone  or ibuprofen.   CONSTIPATION It is common to experience some constipation after surgery and if you are taking pain medication.  Increasing fluid intake and taking a stool softener (such as Colace) will usually help or prevent this problem from occurring.  A mild laxative (Miralax, over-the counter) should be taken according to package instructions if there are no bowel movements after 48 hours. If still no bowel movement 24 hours after taking Miralax, you may try magnesium citrate, available over the counter at a local pharmacy.   WOUND/INCISION CARE Most patients will experience some swelling and bruising in the area of the incisions.  Ice packs will help.  Swelling and bruising can take several days to resolve.  May shower beginning 08/17/2023.  Do not peel off or scrub skin glue. May allow warm soapy water to run over incision, then rinse and pat dry.  Do not soak in any water (tubs, hot tubs, pools, lakes, oceans) for one week.   ACTIVITIES You may resume regular (light) daily activities beginning the next day--such as daily self-care, walking, climbing stairs--gradually increasing activities as tolerated.  You may have sexual intercourse when it is comfortable.   No lifting greater than 5 pounds for six weeks.  You may drive when you are no longer taking narcotic pain medication, you can comfortably wear a seatbelt, and you can safely maneuver your car and apply brakes.  FOLLOW-UP You should see your doctor in the office for a follow-up appointment approximately 2-3 weeks after your surgery.  You should have been given your post-op/follow-up appointment when your surgery was scheduled.  If you did not receive a post-op/follow-up appointment, make sure that you call  for this appointment within a day or two after you arrive home to ensure a convenient appointment time.  WHEN TO CALL YOUR DOCTOR: Fever over 101.5 Inability to urinate Continued bleeding from incision. Increased pain, redness,  or drainage from the incision. Increasing abdominal pain  The clinic staff is available to answer your questions during regular business hours.  Please don't hesitate to call and ask to speak to one of the nurses for clinical concerns.  If you have a medical emergency, go to the nearest emergency room or call 911.  A surgeon from [facility] is always on call at the hospital. [address], [place], [place]  [address] ? [address] [address], [place], [place]   [address] [phone] ? [phone] ? FAX [phone] Web site: [email or web address]

## 2023-08-16 NOTE — Transfer of Care (Signed)
Immediate Anesthesia Transfer of Care Note  Patient: Jenna [name]  Procedure(s) Performed: EXPLORATORY LAPAROTOMY, POSSIBLE SMALL BOWEL RESECTION (Abdomen)  Patient Location: PACU  Anesthesia Type:General  Level of Consciousness: awake and alert   Airway & Oxygen Therapy: Patient Spontanous Breathing and Patient connected to nasal cannula oxygen  Post-op Assessment: Report given to RN and Post -op Vital signs reviewed and stable  Post vital signs: Reviewed and stable  Last Vitals:  Vitals Value Taken Time  BP 137/73 08/16/23 0400  Temp    Pulse 94 08/16/23 0404  Resp 17 08/16/23 0404  SpO2 97 % 08/16/23 0404  Vitals shown include unfiled device data.  Last Pain:  Vitals:   08/16/23 0130  TempSrc:   PainSc: 4          Complications: No notable events documented.

## 2023-08-16 NOTE — H&P (Signed)
History and Physical  Jenna [name] ZOX:[id number] DOB: 08-27-1955 DOA: 08/15/2023  Referring physician: Barrie Dunker, PA-EDP  PCP: [facility] [place]  Outpatient Specialists: None Patient coming from: Home  Chief Complaint: Abdominal pain.  HPI: Jenna [name] is a 68 y.o. female with medical history significant for hyperlipidemia, GERD, paroxysmal A-fib on daily baby aspirin, who initially presented to urgent care for generalized abdominal pain that started in the afternoon of 08/15/2023.  Associated with a bulge on the side of her abdomen and a pop.  Urgent care provider was concerned about an hernia and was unable to reduce it.  Therefore, recommended to come to the ER.  In the ER, contrast-enhanced CT scan showed incisional abdominal wall hernia containing a single loop of herniated small bowel, this causes early partial small bowel obstruction proximal to this herniation.  EDP discussed the case with general surgery.  The patient was taken urgently to the OR due to incarcerated incisional hernia and early SBO.  [facility], hospitalist service, was asked to admit.  POD #0 on 08/16/2023 by general surgery, Dr. Bedelia Person.  Seen at bedside with her husband in PACU.  She has no new complaints.  Vital signs and previous labs are stable.  ED Course: Temperature 97.7.  BP 120/70, pulse 82, respiratory rate 18, O2 saturation 98% on 2 L.  Review of Systems: Review of systems as noted in the HPI. All other systems reviewed and are negative.   Past Medical History:  Diagnosis Date   Allergic rhinitis    Atrial fibrillation (HCC) 08/22/2013   Cataract    Colon polyp 07/21/2006   on last colonoscopy with Dr. Loreta Ave in 2008   Deviated nasal septum    Diverticulosis 07/21/2006   on last colonoscopy with Dr. Loreta Ave in 2008   Gastric ulcer with hemorrhage    GERD (gastroesophageal reflux disease)    Hypercalcemia    Hypercholesteremia    Hyperlipidemia    Hypertension  07/29/2018   Insomnia    Iron deficiency anemia    After ulcer in 2013, resolved   Osteoarthritis    Paroxysmal atrial fibrillation (HCC)    Statin myopathy 05/14/2020   Vitamin B12 deficiency    Past Surgical History:  Procedure Laterality Date   CESAREAN SECTION     ESOPHAGOGASTRODUODENOSCOPY N/A 08/10/2015   Procedure: ESOPHAGOGASTRODUODENOSCOPY (EGD);  Surgeon: Jeani Hawking, MD;  Location: [facility] ENDOSCOPY;  Service: Endoscopy;  Laterality: N/A;   HIATAL HERNIA REPAIR  08/15/2015   LAPAROSCOPIC NISSEN FUNDOPLICATION  08/15/2015   LAPAROSCOPIC NISSEN FUNDOPLICATION N/A 08/15/2015   Procedure: LAPAROSCOPIC HIATAL HERNIA REPAIR AND NISSEN FUNDOPLICATION;  Surgeon: Axel Filler, MD;  Location: [facility] OR;  Service: General;  Laterality: N/A;   NASAL SINUS SURGERY     TONSILLECTOMY      Social History:  reports that she has never smoked. She has never used smokeless tobacco. She reports current alcohol use. She reports that she does not use drugs.   Allergies  Allergen Reactions   Bactrim [Sulfamethoxazole-Trimethoprim] Other (See Comments)    Canker sores in her mouth.   Ezetimibe     Stomach cramping   Statins     Myalgias with pravastatin 40mg  daily, simvastatin 20mg  daily, rosuvastatin 10mg  twice weekly   Adhesive [Tape] Rash and Other (See Comments)    Burns to the skin   Latex Rash and Other (See Comments)    Burns to skin    Family History  Problem Relation Age of Onset  Hypertension Mother    Hepatitis Mother    Heart attack Father    Hydrocephalus Father    Breast cancer Maternal Aunt 36      Prior to Admission medications   Medication Sig Start Date End Date Taking? Authorizing Provider  docusate sodium (COLACE) 100 MG capsule Take 1 capsule (100 mg total) by mouth 2 (two) times daily. 08/16/23 08/15/24 Yes Diamantina Monks, MD  ibuprofen (ADVIL) 600 MG tablet Take 1 tablet (600 mg total) by mouth every 6 (six) hours as needed. 08/16/23  Yes Diamantina Monks, MD   methocarbamol (ROBAXIN-750) 750 MG tablet Take 1 tablet (750 mg total) by mouth 4 (four) times daily. 08/16/23  Yes Lovick, Lennie Odor, MD  oxyCODONE (ROXICODONE) 5 MG immediate release tablet Take 1 tablet (5 mg total) by mouth every 4 (four) hours as needed for severe pain (pain score 7-10). 08/16/23  Yes Diamantina Monks, MD  acetaminophen (TYLENOL) 500 MG tablet Take 2 tablets (1,000 mg total) by mouth every 6 (six) hours. 08/16/23   Diamantina Monks, MD  aspirin EC 325 MG tablet Take 1 tablet (325 mg total) by mouth daily. 07/13/23   Christell Constant, MD  butalbital-aspirin-caffeine [facility]) [phone] MG capsule 1 capsule 03/29/20   [provider]  denosumab (PROLIA) 60 MG/ML SOSY injection Inject into the skin every 6 (six) months.    [provider]  fluticasone (FLONASE) 50 MCG/ACT nasal spray Place 2 sprays into the nose daily as needed for allergies.     [provider]  fluticasone (FLONASE) 50 MCG/ACT nasal spray 1 puff in each nostril    [provider]  loratadine-pseudoephedrine (CLARITIN-D 12 HOUR) 5-120 MG tablet Take by mouth daily at 6 (six) AM. 01/28/22   [provider]  omeprazole (PRILOSEC) 20 MG capsule Take 20 mg by mouth daily.    [provider]  rosuvastatin (CRESTOR) 10 MG tablet Take 1 tablet by mouth once weekly 05/14/20   Corky Crafts, MD    Physical Exam: BP 129/70 (BP Location: Right Arm)   Pulse 83   Temp 97.7 F (36.5 C)   Resp 11   Ht 5\' 3"  (1.6 m)   Wt 59.4 kg   SpO2 98%   BMI 23.20 kg/m   General: 68 y.o. year-old female well developed well nourished in no acute distress.  Alert and oriented x3. Cardiovascular: Regular rate and rhythm with no rubs or gallops.  No thyromegaly or JVD noted.  No lower extremity edema. 2/4 pulses in all 4 extremities. Respiratory: Clear to auscultation with no wheezes or rales. Good inspiratory effort. Abdomen: Soft surgical dressing in place.  Hypoactive  bowel sounds. Muskuloskeletal: No cyanosis, clubbing or edema noted bilaterally Neuro: CN II-XII intact, strength, sensation, reflexes Skin: No ulcerative lesions noted or rashes Psychiatry: Judgement and insight appear normal. Mood is appropriate for condition and setting          Labs on Admission:  Basic Metabolic Panel: Recent Labs  Lab 08/15/23 2138  NA 139  K 3.6  CL 104  CO2 25  GLUCOSE 113*  BUN 6*  CREATININE 0.58  CALCIUM 9.6   Liver Function Tests: Recent Labs  Lab 08/15/23 2138  AST 19  ALT 18  ALKPHOS 69  BILITOT 0.5  PROT 6.8  ALBUMIN 3.8   Recent Labs  Lab 08/15/23 2138  LIPASE 38   No results for input(s): "AMMONIA" in the last 168 hours. CBC: Recent Labs  Lab  08/15/23 2138  WBC 6.4  HGB 13.7  HCT 41.7  MCV 97.4  PLT 292   Cardiac Enzymes: No results for input(s): "CKTOTAL", "CKMB", "CKMBINDEX", "TROPONINI" in the last 168 hours.  BNP (last 3 results) No results for input(s): "BNP" in the last 8760 hours.  ProBNP (last 3 results) No results for input(s): "PROBNP" in the last 8760 hours.  CBG: No results for input(s): "GLUCAP" in the last 168 hours.  Radiological Exams on Admission: CT ABDOMEN PELVIS W CONTRAST Result Date: 08/16/2023 CLINICAL DATA:  Acute abdominal pain EXAM: CT ABDOMEN AND PELVIS WITH CONTRAST TECHNIQUE: Multidetector CT imaging of the abdomen and pelvis was performed using the standard protocol following bolus administration of intravenous contrast. RADIATION DOSE REDUCTION: This exam was performed according to the departmental dose-optimization program which includes automated exposure control, adjustment of the mA and/or kV according to patient size and/or use of iterative reconstruction technique. CONTRAST:  75mL OMNIPAQUE IOHEXOL 350 MG/ML SOLN COMPARISON:  03/12/2018 FINDINGS: Lower chest: No acute abnormality. Hepatobiliary: Gallbladder is well distended with a dependent poorly calcified filling defect. This may  represent a small polyps or poorly calcified stone. Liver is within normal limits. Pancreas: Unremarkable. No pancreatic ductal dilatation or surrounding inflammatory changes. Spleen: Normal in size without focal abnormality. Adrenals/Urinary Tract: Adrenal glands are within normal limits. Kidneys are well visualized bilaterally. Peripelvic renal cysts are noted on the left. No follow-up is recommended. No obstructive changes are seen. The bladder is well distended. Stomach/Bowel: Scattered diverticular change is noted without evidence of diverticulitis. The appendix is not well visualized. No inflammatory changes to suggest appendicitis are noted. Stomach is decompressed. Changes of prior fundoplication are seen. There is a herniated loop of small bowel identified in and anterior supraumbilical hernia. The small bowel loops proximal to this are mildly dilated suggesting early partial small bowel obstruction. The more distal small bowel loops are within normal limits. Vascular/Lymphatic: No significant vascular findings are present. No enlarged abdominal or pelvic lymph nodes. Reproductive: Uterus and bilateral adnexa are unremarkable. Other: No abdominal wall hernia or abnormality. No abdominopelvic ascites. Musculoskeletal: No acute or significant osseous findings. IMPRESSION: Ventral abdominal wall hernia containing a single loop of herniated small bowel. This causes early partial small bowel obstruction proximal to this herniation. Diverticulosis without diverticulitis. Poorly calcified filling defect within the gallbladder possibly representing a polyp or stone. Electronically Signed   By: Alcide Clever M.D.   On: 08/16/2023 01:14    EKG: I independently viewed the EKG done and my findings are as followed: None available at the time of this visit.  Assessment/Plan Present on Admission:  Incarcerated hernia  Principal Problem:   Incarcerated hernia  Incarcerated incisional hernia status post  exploratory laparotomy POD #0 on 08/16/2023 by Dr. Bedelia Person Vital signs and previous labs are stable, no new complaints at this time. Further management per General surgery As needed pain control Gentle IV fluid hydration LR 50 cc/h x 1 day  Paroxysmal A-fib on daily baby aspirin On daily aspirin Rate is controlled, not on rate control agent Restart aspirin when okay with general surgery.  GERD Resume home PPI  Hyperlipidemia Resume home Crestor.   Time: 75 minutes.   DVT prophylaxis: SCDs, defer chemical DVT prophylaxis to general surgery.  Code Status: Full code.  Family Communication: Husband at bedside.  Disposition Plan: Admitted to telemetry surgical unit.  Consults called: General Surgery.  Admission status: Inpatient status.   Status is: Inpatient The patient requires at least 2 midnights for  further evaluation and treatment of present condition.  Darlin Drop MD [facility] Pager [phone]  If 7PM-7AM, please contact night-coverage [email or web address] Password [facility]  08/16/2023, 5:49 AM

## 2023-08-17 ENCOUNTER — Other Ambulatory Visit (HOSPITAL_COMMUNITY): Payer: Self-pay

## 2023-08-17 ENCOUNTER — Encounter: Payer: Self-pay | Admitting: Oncology

## 2023-08-17 ENCOUNTER — Encounter (HOSPITAL_COMMUNITY): Payer: Self-pay | Admitting: Surgery

## 2023-08-17 DIAGNOSIS — K460 Unspecified abdominal hernia with obstruction, without gangrene: Secondary | ICD-10-CM | POA: Diagnosis not present

## 2023-08-17 MED ORDER — ONDANSETRON HCL 4 MG/2ML IJ SOLN
4.0000 mg | Freq: Three times a day (TID) | INTRAMUSCULAR | Status: DC | PRN
  Administered 2023-08-17 – 2023-08-18 (×2): 4 mg via INTRAVENOUS
  Filled 2023-08-17: qty 2

## 2023-08-17 MED ORDER — TRAMADOL HCL 50 MG PO TABS
50.0000 mg | ORAL_TABLET | Freq: Four times a day (QID) | ORAL | Status: DC | PRN
  Administered 2023-08-17: 50 mg via ORAL
  Filled 2023-08-17: qty 1

## 2023-08-17 NOTE — Anesthesia Postprocedure Evaluation (Signed)
Anesthesia Post Note  Patient: Jenna [name]  Procedure(s) Performed: EXPLORATORY LAPAROTOMY, POSSIBLE SMALL BOWEL RESECTION (Abdomen)     Patient location during evaluation: PACU Anesthesia Type: General Level of consciousness: awake and alert Pain management: pain level controlled Vital Signs Assessment: post-procedure vital signs reviewed and stable Respiratory status: spontaneous breathing, nonlabored ventilation and respiratory function stable Cardiovascular status: blood pressure returned to baseline and stable Postop Assessment: no apparent nausea or vomiting Anesthetic complications: no   No notable events documented.  Last Vitals:    Last Pain:                 Collene Schlichter

## 2023-08-17 NOTE — Progress Notes (Signed)
1 Day Post-Op  Subjective: CC: Some soreness at her incision that is worse with movement. Also reports she is having a headache but otherwise really not having pain.  She received oxycodone earlier this morning for headache and believes this caused her to have nausea and vomiting.  She has since tolerating CLD without nausea or vomiting. Passing flatus. No BM. Voiding. Ambulating.   Objective: Vital signs in last 24 hours: Temp:  [97.5 F (36.4 C)-98.7 F (37.1 C)] 97.5 F (36.4 C) (01/27 0732) Pulse Rate:  [81-95] 81 (01/27 0732) Resp:  [16-17] 17 (01/27 0732) BP: (110-147)/(70-82) 147/82 (01/27 0732) SpO2:  [95 %-98 %] 98 % (01/27 0732) Last BM Date : 08/15/23  Intake/Output from previous day: 01/26 0701 - 01/27 0700 In: 413 [I.V.:213; IV Piggyback:200] Out: -  Intake/Output this shift: No intake/output data recorded.  PE: Gen:  Alert, NAD, pleasant Abd: Soft, mild distension, appropriately tender around her midline incision, +BS. Midline wound with honeycomb dressing in place, cdi  Lab Results:  Recent Labs    08/15/23 2138 08/16/23 1217  WBC 6.4 11.4*  HGB 13.7 13.1  HCT 41.7 37.9  PLT 292 265   BMET Recent Labs    08/15/23 2138 08/16/23 1217  NA 139 137  K 3.6 3.8  CL 104 104  CO2 25 21*  GLUCOSE 113* 134*  BUN 6* 8  CREATININE 0.58 0.69  CALCIUM 9.6 8.7*   PT/INR No results for input(s): "LABPROT", "INR" in the last 72 hours. CMP     Component Value Date/Time   NA 137 08/16/2023 1217   NA 143 12/28/2012 1459   K 3.8 08/16/2023 1217   K 3.8 12/28/2012 1459   CL 104 08/16/2023 1217   CL 108 (H) 12/28/2012 1459   CO2 21 (L) 08/16/2023 1217   CO2 28 12/28/2012 1459   GLUCOSE 134 (H) 08/16/2023 1217   GLUCOSE 65 (L) 12/28/2012 1459   BUN 8 08/16/2023 1217   BUN 12.1 12/28/2012 1459   CREATININE 0.69 08/16/2023 1217   CREATININE 0.8 12/28/2012 1459   CALCIUM 8.7 (L) 08/16/2023 1217   CALCIUM 9.4 12/28/2012 1459   PROT 6.3 (L) 08/16/2023  1217   PROT 6.5 07/09/2020 1003   PROT 6.6 12/28/2012 1459   ALBUMIN 3.4 (L) 08/16/2023 1217   ALBUMIN 4.2 07/09/2020 1003   ALBUMIN 3.6 12/28/2012 1459   AST 21 08/16/2023 1217   AST 16 12/28/2012 1459   ALT 16 08/16/2023 1217   ALT 10 12/28/2012 1459   ALKPHOS 56 08/16/2023 1217   ALKPHOS 73 12/28/2012 1459   BILITOT 0.8 08/16/2023 1217   BILITOT 0.5 07/09/2020 1003   BILITOT 0.43 12/28/2012 1459   GFRNONAA >60 08/16/2023 1217   GFRAA >60 08/17/2015 0345   Lipase     Component Value Date/Time   LIPASE 38 08/15/2023 2138    Studies/Results: CT ABDOMEN PELVIS W CONTRAST Result Date: 08/16/2023 CLINICAL DATA:  Acute abdominal pain EXAM: CT ABDOMEN AND PELVIS WITH CONTRAST TECHNIQUE: Multidetector CT imaging of the abdomen and pelvis was performed using the standard protocol following bolus administration of intravenous contrast. RADIATION DOSE REDUCTION: This exam was performed according to the departmental dose-optimization program which includes automated exposure control, adjustment of the mA and/or kV according to patient size and/or use of iterative reconstruction technique. CONTRAST:  75mL OMNIPAQUE IOHEXOL 350 MG/ML SOLN COMPARISON:  03/12/2018 FINDINGS: Lower chest: No acute abnormality. Hepatobiliary: Gallbladder is well distended with a dependent poorly calcified filling defect. This  may represent a small polyps or poorly calcified stone. Liver is within normal limits. Pancreas: Unremarkable. No pancreatic ductal dilatation or surrounding inflammatory changes. Spleen: Normal in size without focal abnormality. Adrenals/Urinary Tract: Adrenal glands are within normal limits. Kidneys are well visualized bilaterally. Peripelvic renal cysts are noted on the left. No follow-up is recommended. No obstructive changes are seen. The bladder is well distended. Stomach/Bowel: Scattered diverticular change is noted without evidence of diverticulitis. The appendix is not well visualized. No  inflammatory changes to suggest appendicitis are noted. Stomach is decompressed. Changes of prior fundoplication are seen. There is a herniated loop of small bowel identified in and anterior supraumbilical hernia. The small bowel loops proximal to this are mildly dilated suggesting early partial small bowel obstruction. The more distal small bowel loops are within normal limits. Vascular/Lymphatic: No significant vascular findings are present. No enlarged abdominal or pelvic lymph nodes. Reproductive: Uterus and bilateral adnexa are unremarkable. Other: No abdominal wall hernia or abnormality. No abdominopelvic ascites. Musculoskeletal: No acute or significant osseous findings. IMPRESSION: Ventral abdominal wall hernia containing a single loop of herniated small bowel. This causes early partial small bowel obstruction proximal to this herniation. Diverticulosis without diverticulitis. Poorly calcified filling defect within the gallbladder possibly representing a polyp or stone. Electronically Signed   By: Alcide Clever M.D.   On: 08/16/2023 01:14    Anti-infectives: Anti-infectives (From admission, onward)    Start     Dose/Rate Route Frequency Ordered Stop   08/16/23 1045  amoxicillin-clavulanate (AUGMENTIN) 875-125 MG per tablet 1 tablet        1 tablet Oral Every 12 hours 08/16/23 0949 08/23/23 0959        Assessment/Plan POD1 s/p ex lap, incarcerated incisional hernia repair by Dr. Bedelia Person 1/26 - ADAT - Mobilize, abd binder when oob - Pulm toilet  FEN: FLD. ADAT ID: augmentin (sinusitis), no further abx needed from our standpoint VTE: SCDs, okay for chem ppx from our standpoint.    LOS: 1 day    Jacinto Halim , [facility] Surgery 08/17/2023, 12:14 PM Please see Amion for pager number during day hours 7:00am-4:30pm

## 2023-08-17 NOTE — Progress Notes (Signed)
PROGRESS NOTE  Jenna [name] ZOX:[id number] DOB: 07-26-1955   PCP: [facility] [place]  Patient is from: Home.  Independently ambulates at baseline.  Lives with husband.  DOA: 08/15/2023 LOS: 1  Chief complaints Chief Complaint  Patient presents with   Abdominal Pain     Brief Narrative / Interim history: 68 year old F with PMH of PAF not on AC, HLD, GERD and sinusitis present to urgent care with generalized abdominal pain and bulging from abdominal wall.  She was directed to ED for further evaluation.  CT abdomen and pelvis raises concern for incisional abdominal wall hernia with bowel incarceration.  She was taken to the OR emergently and had ex lap with reduction of hernia on 1/26.   No postop complication.  Slowly improving.  Subjective: Seen and examined earlier this morning.  No major events overnight of this morning.  She reports nausea and 2 small emesis last night and this morning.  Has not a bowel movement but having flatus.  Denies pain.  Reports mild headache that has improved after pain medication.  Objective: Vitals:   08/16/23 1454 08/16/23 2120 08/17/23 0506 08/17/23 0732  BP: 110/78 132/70 137/79 (!) 147/82  Pulse: 95 93 86 81  Resp: 16   17  Temp: 98.7 F (37.1 C) 98.7 F (37.1 C) 98.2 F (36.8 C) (!) 97.5 F (36.4 C)  TempSrc: Oral Oral Oral Oral  SpO2: 95% 96% 97% 98%  Weight:      Height:        Examination:  GENERAL: No apparent distress.  Nontoxic. HEENT: MMM.  Vision and hearing grossly intact.  NECK: Supple.  No apparent JVD.  RESP:  No IWOB.  Fair aeration bilaterally. CVS:  RRR. Heart sounds normal.  ABD/GI/GU: BS+. Abd soft, NTND.  Honeycomb dressing DCI. MSK/EXT:  Moves extremities. No apparent deformity. No edema.  SKIN: no apparent skin lesion or wound NEURO: Awake, alert and oriented appropriately.  No apparent focal neuro deficit. PSYCH: Calm. Normal affect.   Procedures:  1/26-ex lap with abdominal  hernia reduction by Dr. Bedelia Person  Microbiology summarized: None  Assessment and plan: Abdominal hernia with bowel incarceration -S/p ex lap and hernia reduction by Dr. Bedelia Person on 1/26. -Passing flatus but no bowel movement.  Had some nausea and 2 small emesis this morning. -Per general surgery-FLD, ADAT, mobilize, abdominal binder and pulmonary toilet -Ordered IV Zofran as needed for nausea.   Nausea and emesis: Likely due to the above -IV Zofran as needed -Ambulate   Paroxysmal A-fib: Currently in sinus rhythm.  Not on anticoagulation but takes full dose aspirin.  -Advised to avoid over-the-counter decongestants.  She takes Claritin-D.  Acute sinusitis: Recently started on doxycycline. -Continue Augmentin, Flonase no spray, saline nose spray and plain Claritin  GERD -PPI   Hyperlipidemia -Statin  Tension headache -Tylenol as needed  Body mass index is 23.2 kg/m.           DVT prophylaxis:  SCDs Start: 08/16/23 0352  Code Status: Full code Family Communication: None at bedside Level of care: Telemetry Surgical Status is: Inpatient Remains inpatient appropriate because: Due to incarcerated abdominal pain   Final disposition: Home Consultants:  General surgery  35 minutes with more than 50% spent in reviewing records, counseling patient/family and coordinating care.   Sch Meds:  Scheduled Meds:  amoxicillin-clavulanate  1 tablet Oral Q12H   fluticasone  2 spray Each Nare Daily   ketorolac  15 mg Intravenous Q6H   loratadine  10 mg Oral Daily   methocarbamol (ROBAXIN) injection  1,000 mg Intravenous Q8H   pantoprazole  40 mg Oral Daily   Continuous Infusions:   PRN Meds:.acetaminophen, melatonin, morphine injection, ondansetron (ZOFRAN) IV, polyethylene glycol, sodium chloride, traMADol  Antimicrobials: Anti-infectives (From admission, onward)    Start     Dose/Rate Route Frequency Ordered Stop   08/16/23 1045  amoxicillin-clavulanate (AUGMENTIN)  875-125 MG per tablet 1 tablet        1 tablet Oral Every 12 hours 08/16/23 0949 08/23/23 0959        I have personally reviewed the following labs and images: CBC: Recent Labs  Lab 08/15/23 2138 08/16/23 1217  WBC 6.4 11.4*  HGB 13.7 13.1  HCT 41.7 37.9  MCV 97.4 94.3  PLT 292 265   BMP &GFR Recent Labs  Lab 08/15/23 2138 08/16/23 1217  NA 139 137  K 3.6 3.8  CL 104 104  CO2 25 21*  GLUCOSE 113* 134*  BUN 6* 8  CREATININE 0.58 0.69  CALCIUM 9.6 8.7*  MG  --  1.9  PHOS  --  4.5   Estimated Creatinine Clearance: 54.9 mL/min (by C-G formula based on SCr of 0.69 mg/dL). Liver & Pancreas: Recent Labs  Lab 08/15/23 2138 08/16/23 1217  AST 19 21  ALT 18 16  ALKPHOS 69 56  BILITOT 0.5 0.8  PROT 6.8 6.3*  ALBUMIN 3.8 3.4*   Recent Labs  Lab 08/15/23 2138  LIPASE 38   No results for input(s): "AMMONIA" in the last 168 hours. Diabetic: No results for input(s): "HGBA1C" in the last 72 hours. No results for input(s): "GLUCAP" in the last 168 hours. Cardiac Enzymes: No results for input(s): "CKTOTAL", "CKMB", "CKMBINDEX", "TROPONINI" in the last 168 hours. No results for input(s): "PROBNP" in the last 8760 hours. Coagulation Profile: No results for input(s): "INR", "PROTIME" in the last 168 hours. Thyroid Function Tests: No results for input(s): "TSH", "T4TOTAL", "FREET4", "T3FREE", "THYROIDAB" in the last 72 hours. Lipid Profile: No results for input(s): "CHOL", "HDL", "LDLCALC", "TRIG", "CHOLHDL", "LDLDIRECT" in the last 72 hours. Anemia Panel: No results for input(s): "VITAMINB12", "FOLATE", "FERRITIN", "TIBC", "IRON", "RETICCTPCT" in the last 72 hours. Urine analysis:    Component Value Date/Time   COLORURINE YELLOW 08/15/2023 2146   APPEARANCEUR CLEAR 08/15/2023 2146   LABSPEC 1.012 08/15/2023 2146   PHURINE 7.0 08/15/2023 2146   GLUCOSEU NEGATIVE 08/15/2023 2146   HGBUR NEGATIVE 08/15/2023 2146   BILIRUBINUR NEGATIVE 08/15/2023 2146   KETONESUR  NEGATIVE 08/15/2023 2146   PROTEINUR NEGATIVE 08/15/2023 2146   UROBILINOGEN 0.2 02/26/2012 2011   NITRITE NEGATIVE 08/15/2023 2146   LEUKOCYTESUR NEGATIVE 08/15/2023 2146   Sepsis Labs: Invalid input(s): "PROCALCITONIN", "LACTICIDVEN"  Microbiology: No results found for this or any previous visit (from the past 240 hours).  Radiology Studies: No results found.     Briani Maul T. Elkin Belfield [facility]  If 7PM-7AM, please contact night-coverage [email or web address] 08/17/2023, 2:14 PM

## 2023-08-18 DIAGNOSIS — K460 Unspecified abdominal hernia with obstruction, without gangrene: Secondary | ICD-10-CM | POA: Diagnosis not present

## 2023-08-18 LAB — CBC
HCT: 35.9 % — ABNORMAL LOW (ref 36.0–46.0)
Hemoglobin: 12.3 g/dL (ref 12.0–15.0)
MCH: 32.4 pg (ref 26.0–34.0)
MCHC: 34.3 g/dL (ref 30.0–36.0)
MCV: 94.5 fL (ref 80.0–100.0)
Platelets: 256 10*3/uL (ref 150–400)
RBC: 3.8 MIL/uL — ABNORMAL LOW (ref 3.87–5.11)
RDW: 12.8 % (ref 11.5–15.5)
WBC: 8.6 10*3/uL (ref 4.0–10.5)
nRBC: 0 % (ref 0.0–0.2)

## 2023-08-18 LAB — RENAL FUNCTION PANEL
Albumin: 3.2 g/dL — ABNORMAL LOW (ref 3.5–5.0)
Anion gap: 10 (ref 5–15)
BUN: 6 mg/dL — ABNORMAL LOW (ref 8–23)
CO2: 23 mmol/L (ref 22–32)
Calcium: 8.7 mg/dL — ABNORMAL LOW (ref 8.9–10.3)
Chloride: 104 mmol/L (ref 98–111)
Creatinine, Ser: 0.49 mg/dL (ref 0.44–1.00)
GFR, Estimated: >60 mL/min (ref >60)
Glucose, Bld: 106 mg/dL — ABNORMAL HIGH (ref 70–99)
Phosphorus: 2.5 mg/dL (ref 2.5–4.6)
Potassium: 3.4 mmol/L — ABNORMAL LOW (ref 3.5–5.1)
Sodium: 137 mmol/L (ref 135–145)

## 2023-08-18 LAB — MAGNESIUM: Magnesium: 2.1 mg/dL (ref 1.7–2.4)

## 2023-08-18 LAB — SURGICAL PATHOLOGY

## 2023-08-18 MED ORDER — ASPIRIN-ACETAMINOPHEN-CAFFEINE 250-250-65 MG PO TABS: 2.0000 | ORAL_TABLET | Freq: Four times a day (QID) | ORAL | Status: DC | PRN

## 2023-08-18 MED ORDER — METOCLOPRAMIDE HCL 5 MG/ML IJ SOLN
5.0000 mg | Freq: Once | INTRAMUSCULAR | Status: AC
  Administered 2023-08-18: 5 mg via INTRAVENOUS
  Filled 2023-08-18: qty 2

## 2023-08-18 MED ORDER — ENOXAPARIN SODIUM 40 MG/0.4ML IJ SOSY: 40.0000 mg | PREFILLED_SYRINGE | INTRAMUSCULAR | Status: DC

## 2023-08-18 MED ORDER — SUMATRIPTAN SUCCINATE 50 MG PO TABS
50.0000 mg | ORAL_TABLET | Freq: Once | ORAL | Status: AC
  Administered 2023-08-18: 50 mg via ORAL
  Filled 2023-08-18: qty 1

## 2023-08-18 MED ORDER — AMOXICILLIN-POT CLAVULANATE 875-125 MG PO TABS: 1.0000 | ORAL_TABLET | Freq: Two times a day (BID) | ORAL | 0 refills | Status: AC

## 2023-08-18 NOTE — Progress Notes (Signed)
    2 Days Post-Op  Subjective: Tolerating diet without nausea or vomiting. Denies abdominal pain. Passing flatus. Discussed post-op care and answered questions.   Objective: Vital signs in last 24 hours: Temp:  [98.2 F (36.8 C)-98.7 F (37.1 C)] 98.2 F (36.8 C) (01/28 0743) Pulse Rate:  [84-94] 87 (01/28 0743) Resp:  [17-18] 17 (01/28 0743) BP: (141-158)/(76-85) 158/85 (01/28 0743) SpO2:  [97 %-100 %] 97 % (01/28 0743) Last BM Date : 08/15/23  Intake/Output from previous day: No intake/output data recorded. Intake/Output this shift: No intake/output data recorded.  PE: Gen:  Alert, NAD, pleasant Abd: Soft, mild distension, appropriately tender around her midline incision, +BS. Midline wound with honeycomb dressing in place, cdi  Lab Results:  Recent Labs    08/16/23 1217 08/18/23 0554  WBC 11.4* 8.6  HGB 13.1 12.3  HCT 37.9 35.9*  PLT 265 256   BMET Recent Labs    08/16/23 1217 08/18/23 0554  NA 137 137  K 3.8 3.4*  CL 104 104  CO2 21* 23  GLUCOSE 134* 106*  BUN 8 6*  CREATININE 0.69 0.49  CALCIUM 8.7* 8.7*   PT/INR No results for input(s): "LABPROT", "INR" in the last 72 hours. CMP     Component Value Date/Time   NA 137 08/18/2023 0554   NA 143 12/28/2012 1459   K 3.4 (L) 08/18/2023 0554   K 3.8 12/28/2012 1459   CL 104 08/18/2023 0554   CL 108 (H) 12/28/2012 1459   CO2 23 08/18/2023 0554   CO2 28 12/28/2012 1459   GLUCOSE 106 (H) 08/18/2023 0554   GLUCOSE 65 (L) 12/28/2012 1459   BUN 6 (L) 08/18/2023 0554   BUN 12.1 12/28/2012 1459   CREATININE 0.49 08/18/2023 0554   CREATININE 0.8 12/28/2012 1459   CALCIUM 8.7 (L) 08/18/2023 0554   CALCIUM 9.4 12/28/2012 1459   PROT 6.3 (L) 08/16/2023 1217   PROT 6.5 07/09/2020 1003   PROT 6.6 12/28/2012 1459   ALBUMIN 3.2 (L) 08/18/2023 0554   ALBUMIN 4.2 07/09/2020 1003   ALBUMIN 3.6 12/28/2012 1459   AST 21 08/16/2023 1217   AST 16 12/28/2012 1459   ALT 16 08/16/2023 1217   ALT 10 12/28/2012  1459   ALKPHOS 56 08/16/2023 1217   ALKPHOS 73 12/28/2012 1459   BILITOT 0.8 08/16/2023 1217   BILITOT 0.5 07/09/2020 1003   BILITOT 0.43 12/28/2012 1459   GFRNONAA >60 08/18/2023 0554   GFRAA >60 08/17/2015 0345   Lipase     Component Value Date/Time   LIPASE 38 08/15/2023 2138    Studies/Results: No results found.   Anti-infectives: Anti-infectives (From admission, onward)    Start     Dose/Rate Route Frequency Ordered Stop   08/16/23 1045  amoxicillin-clavulanate (AUGMENTIN) 875-125 MG per tablet 1 tablet        1 tablet Oral Every 12 hours 08/16/23 0949 08/23/23 0959        Assessment/Plan POD2 s/p ex lap, incarcerated incisional hernia repair by Dr. Bedelia Person 1/26 - stable for DC, follow up in AVS  FEN: soft diet ID: augmentin (sinusitis), no further abx needed from our standpoint VTE: LMWH   LOS: 2 days    Juliet Rude , [facility] 08/18/2023, 10:12 AM Please see Amion for pager number during day hours 7:00am-4:30pm

## 2023-08-18 NOTE — Progress Notes (Signed)
Reviewed AVS, patient expressed understanding of medications, MD follow up reviewed.  Pt transported to entrance A where family member was waiting in vehicle to transport home.

## 2023-08-18 NOTE — Discharge Summary (Signed)
Physician Discharge Summary  Jenna [name] Santino WJX:[id number] DOB: [birth year] DOA: 08/15/2023  PCP: Sigmund Hazel, MD  Admit date: 08/15/2023 Discharge date: 08/18/2023 Admitted From: Home Disposition: Home Recommendations for Outpatient Follow-up:  Outpatient follow-up with PCP and general surgery as below Check MP and CBC at follow-up Please follow up on the following pending results: None  Home Health: Not indicated Equipment/Devices: Not indicated  Discharge Condition: Stable CODE STATUS: Full code  Follow-up Information     Diamantina Monks, MD Follow up in 2 week(s).   Specialty: Surgery Why: For wound re-check Contact information: [address] [place] [address] [facility] [place] [address] [phone]         [facility] Follow up.   Specialty: Family Medicine Contact information: [address] [place] [address] [phone]                 Hospital course 68 year old F with PMH of PAF not on AC, HLD, GERD and sinusitis present to urgent care with generalized abdominal pain and bulging from abdominal wall.  She was directed to ED for further evaluation.  CT abdomen and pelvis raises concern for incisional abdominal wall hernia with bowel incarceration.  She was taken to the OR emergently and had ex lap with reduction of hernia on 1/26.    No postop complication.  Patient improved and tolerated solid food.  Cleared for discharge by general surgery for outpatient follow-up.   See individual problem list below for more.   Problems addressed during this hospitalization Abdominal hernia with bowel incarceration -S/p ex lap and hernia reduction by Dr. Bedelia Person on 1/26. -Tolerating solid food.  Cleared for discharge for outpatient follow-up   Nausea and emesis: Likely due to the above.  Resolved.   Paroxysmal A-fib: Currently in sinus rhythm.  Not on anticoagulation but takes full dose aspirin.   -Advised to avoid over-the-counter decongestants.  Discontinued Claritin-D.   Acute sinusitis: Recently started on doxycycline. -Received for 2 days.  Discharged on Augmentin for 7 more days.   GERD -PPI   Hyperlipidemia -Statin   Migraine headache: Likely due to sleep deprivation.  Improved after Excedrin, Imitrex and Reglan.  Neuroexam reassuring. -Continue home Excedrin or Fioricet.            Time spent 35 minutes  Vital signs Vitals:   08/17/23 1423 08/17/23 2130 08/18/23 0545 08/18/23 0743  BP: (!) 147/80 (!) 143/76 (!) 141/80 (!) 158/85  Pulse: 94 88 84 87  Temp: 98.4 F (36.9 C) 98.7 F (37.1 C) 98.2 F (36.8 C) 98.2 F (36.8 C)  Resp: 18 17 17 17   Height:      Weight:      SpO2: 100% 100% 98% 97%  TempSrc: Oral Oral  Oral  BMI (Calculated):         Discharge exam  GENERAL: No apparent distress.  Nontoxic. HEENT: MMM.  Vision and hearing grossly intact.  NECK: Supple.  No apparent JVD.  RESP:  No IWOB.  Fair aeration bilaterally. CVS:  RRR. Heart sounds normal.  ABD/GI/GU: BS+. Abd soft, NTND.  Surgical wound DCI. MSK/EXT:  Moves extremities. No apparent deformity. No edema.  SKIN: no apparent skin lesion or wound NEURO: Awake and alert. Oriented appropriately.  No apparent focal neuro deficit. PSYCH: Calm. Normal affect.   Discharge Instructions Discharge Instructions     Diet general   Complete by: As directed    Discharge instructions   Complete by:  As directed    It has been a pleasure taking care of you!  You were hospitalized with bowel obstruction due to hernia for which you have been treated surgically.  Please follow-up with general surgery per their recommendation.  Follow-up with your primary care doctor in 1 to 2 weeks or sooner if needed.   Take care,   Increase activity slowly   Complete by: As directed       Allergies as of 08/18/2023       Reactions   Bactrim [sulfamethoxazole-trimethoprim] Other (See Comments)    Canker sores in her mouth.   Ezetimibe    Stomach cramping   Statins    Myalgias with pravastatin 40mg  daily, simvastatin 20mg  daily, rosuvastatin 10mg  twice weekly   Adhesive [tape] Rash, Other (See Comments)   Burns to the skin   Latex Rash, Other (See Comments)   Burns to skin        Medication List     STOP taking these medications    Claritin-D 12 Hour 5-120 MG tablet Generic drug: loratadine-pseudoephedrine   doxycycline 100 MG tablet Commonly known as: VIBRA-TABS       TAKE these medications    Acetaminophen Extra Strength 500 MG Tabs Take 2 tablets (1,000 mg total) by mouth every 6 (six) hours. What changed:  how much to take when to take this reasons to take this   amoxicillin-clavulanate 875-125 MG tablet Commonly known as: AUGMENTIN Take 1 tablet by mouth every 12 (twelve) hours for 5 days. What changed: when to take this   aspirin EC 81 MG tablet Take 81 mg by mouth daily. Swallow whole.   BIOTIN PO Take 1 tablet by mouth daily.   butalbital-aspirin-caffeine 50-325-40 MG capsule Commonly known as: FIORINAL Take 1 capsule by mouth every 6 (six) hours as needed for headache or migraine.   docusate sodium 100 MG capsule Commonly known as: Colace Take 1 capsule (100 mg total) by mouth 2 (two) times daily.   EXCEDRIN EXTRA STRENGTH PO Take 2 tablets by mouth daily.   fluticasone 50 MCG/ACT nasal spray Commonly known as: FLONASE Place 2 sprays into the nose 2 (two) times daily.   ibuprofen 600 MG tablet Commonly known as: ADVIL Take 1 tablet (600 mg total) by mouth every 6 (six) hours as needed.   methocarbamol 750 MG tablet Commonly known as: Robaxin-750 Take 1 tablet (750 mg total) by mouth 4 (four) times daily.   MULTIVITAL PO Take 1 tablet by mouth daily.   omeprazole 20 MG capsule Commonly known as: PRILOSEC Take 20 mg by mouth daily.   oxyCODONE 5 MG immediate release tablet Commonly known as: Roxicodone Take 1 tablet (5 mg  total) by mouth every 4 (four) hours as needed for severe pain (pain score 7-10).   PROBIOTIC PO Take 1 Dose by mouth daily.   Prolia 60 MG/ML Sosy injection Generic drug: denosumab Inject 60 mg into the skin every 6 (six) months.   VITAMIN D-VITAMIN K PO Take 1 tablet by mouth daily.        Consultations: General surgery  Procedures/Studies: 1/26-ex lap with abdominal hernia reduction by Dr. Bedelia Person    CT ABDOMEN PELVIS W CONTRAST Result Date: 08/16/2023 CLINICAL DATA:  Acute abdominal pain EXAM: CT ABDOMEN AND PELVIS WITH CONTRAST TECHNIQUE: Multidetector CT imaging of the abdomen and pelvis was performed using the standard protocol following bolus administration of intravenous contrast. RADIATION DOSE REDUCTION: This exam was performed according to the departmental dose-optimization program which  includes automated exposure control, adjustment of the mA and/or kV according to patient size and/or use of iterative reconstruction technique. CONTRAST:  75mL OMNIPAQUE IOHEXOL 350 MG/ML SOLN COMPARISON:  03/12/2018 FINDINGS: Lower chest: No acute abnormality. Hepatobiliary: Gallbladder is well distended with a dependent poorly calcified filling defect. This may represent a small polyps or poorly calcified stone. Liver is within normal limits. Pancreas: Unremarkable. No pancreatic ductal dilatation or surrounding inflammatory changes. Spleen: Normal in size without focal abnormality. Adrenals/Urinary Tract: Adrenal glands are within normal limits. Kidneys are well visualized bilaterally. Peripelvic renal cysts are noted on the left. No follow-up is recommended. No obstructive changes are seen. The bladder is well distended. Stomach/Bowel: Scattered diverticular change is noted without evidence of diverticulitis. The appendix is not well visualized. No inflammatory changes to suggest appendicitis are noted. Stomach is decompressed. Changes of prior fundoplication are seen. There is a herniated loop  of small bowel identified in and anterior supraumbilical hernia. The small bowel loops proximal to this are mildly dilated suggesting early partial small bowel obstruction. The more distal small bowel loops are within normal limits. Vascular/Lymphatic: No significant vascular findings are present. No enlarged abdominal or pelvic lymph nodes. Reproductive: Uterus and bilateral adnexa are unremarkable. Other: No abdominal wall hernia or abnormality. No abdominopelvic ascites. Musculoskeletal: No acute or significant osseous findings. IMPRESSION: Ventral abdominal wall hernia containing a single loop of herniated small bowel. This causes early partial small bowel obstruction proximal to this herniation. Diverticulosis without diverticulitis. Poorly calcified filling defect within the gallbladder possibly representing a polyp or stone. Electronically Signed   By: Alcide Clever M.D.   On: 08/16/2023 01:14   MM 3D SCREENING MAMMOGRAM BILATERAL BREAST Result Date: 08/03/2023 CLINICAL DATA:  Screening. EXAM: DIGITAL SCREENING BILATERAL MAMMOGRAM WITH TOMOSYNTHESIS AND CAD TECHNIQUE: Bilateral screening digital craniocaudal and mediolateral oblique mammograms were obtained. Bilateral screening digital breast tomosynthesis was performed. The images were evaluated with computer-aided detection. COMPARISON:  Previous exam(s). ACR Breast Density Category b: There are scattered areas of fibroglandular density. FINDINGS: There are no findings suspicious for malignancy. IMPRESSION: No mammographic evidence of malignancy. A result letter of this screening mammogram will be mailed directly to the patient. RECOMMENDATION: Screening mammogram in one year. (Code:SM-B-01Y) BI-RADS CATEGORY  1: Negative. Electronically Signed   By: Harmon Pier M.D.   On: 08/03/2023 16:58       The results of significant diagnostics from this hospitalization (including imaging, microbiology, ancillary and laboratory) are listed below for reference.      Microbiology: No results found for this or any previous visit (from the past 240 hours).   Labs:  CBC: Recent Labs  Lab 08/15/23 2138 08/16/23 1217 08/18/23 0554  WBC 6.4 11.4* 8.6  HGB 13.7 13.1 12.3  HCT 41.7 37.9 35.9*  MCV 97.4 94.3 94.5  PLT 292 265 256   BMP &GFR Recent Labs  Lab 08/15/23 2138 08/16/23 1217 08/18/23 0554  NA 139 137 137  K 3.6 3.8 3.4*  CL 104 104 104  CO2 25 21* 23  GLUCOSE 113* 134* 106*  BUN 6* 8 6*  CREATININE 0.58 0.69 0.49  CALCIUM 9.6 8.7* 8.7*  MG  --  1.9 2.1  PHOS  --  4.5 2.5   Estimated Creatinine Clearance: 54.9 mL/min (by C-G formula based on SCr of 0.49 mg/dL). Liver & Pancreas: Recent Labs  Lab 08/15/23 2138 08/16/23 1217 08/18/23 0554  AST 19 21  --   ALT 18 16  --   ALKPHOS 69  56  --   BILITOT 0.5 0.8  --   PROT 6.8 6.3*  --   ALBUMIN 3.8 3.4* 3.2*   Recent Labs  Lab 08/15/23 2138  LIPASE 38   No results for input(s): "AMMONIA" in the last 168 hours. Diabetic: No results for input(s): "HGBA1C" in the last 72 hours. No results for input(s): "GLUCAP" in the last 168 hours. Cardiac Enzymes: No results for input(s): "CKTOTAL", "CKMB", "CKMBINDEX", "TROPONINI" in the last 168 hours. No results for input(s): "PROBNP" in the last 8760 hours. Coagulation Profile: No results for input(s): "INR", "PROTIME" in the last 168 hours. Thyroid Function Tests: No results for input(s): "TSH", "T4TOTAL", "FREET4", "T3FREE", "THYROIDAB" in the last 72 hours. Lipid Profile: No results for input(s): "CHOL", "HDL", "LDLCALC", "TRIG", "CHOLHDL", "LDLDIRECT" in the last 72 hours. Anemia Panel: No results for input(s): "VITAMINB12", "FOLATE", "FERRITIN", "TIBC", "IRON", "RETICCTPCT" in the last 72 hours. Urine analysis:    Component Value Date/Time   COLORURINE YELLOW 08/15/2023 2146   APPEARANCEUR CLEAR 08/15/2023 2146   LABSPEC 1.012 08/15/2023 2146   PHURINE 7.0 08/15/2023 2146   GLUCOSEU NEGATIVE 08/15/2023 2146    HGBUR NEGATIVE 08/15/2023 2146   BILIRUBINUR NEGATIVE 08/15/2023 2146   KETONESUR NEGATIVE 08/15/2023 2146   PROTEINUR NEGATIVE 08/15/2023 2146   UROBILINOGEN 0.2 02/26/2012 2011   NITRITE NEGATIVE 08/15/2023 2146   LEUKOCYTESUR NEGATIVE 08/15/2023 2146   Sepsis Labs: Invalid input(s): "PROCALCITONIN", "LACTICIDVEN"   SIGNED:  Almon Hercules, MD  [facility] 08/18/2023, 4:31 PM

## 2023-08-19 ENCOUNTER — Other Ambulatory Visit (HOSPITAL_COMMUNITY): Payer: Self-pay

## 2023-08-20 ENCOUNTER — Other Ambulatory Visit (HOSPITAL_COMMUNITY): Payer: Self-pay

## 2023-08-26 DIAGNOSIS — Z9889 Other specified postprocedural states: Secondary | ICD-10-CM | POA: Diagnosis not present

## 2023-08-26 DIAGNOSIS — R030 Elevated blood-pressure reading, without diagnosis of hypertension: Secondary | ICD-10-CM | POA: Diagnosis not present

## 2023-08-26 DIAGNOSIS — R109 Unspecified abdominal pain: Secondary | ICD-10-CM | POA: Diagnosis not present

## 2023-08-27 ENCOUNTER — Encounter: Payer: Self-pay | Admitting: Physician Assistant

## 2023-08-27 ENCOUNTER — Ambulatory Visit: Payer: Medicare Other | Admitting: Physician Assistant

## 2023-08-27 NOTE — Progress Notes (Deleted)
 Cardiology Office Note:    Date:  08/27/2023  ID:  Yatzari Jonsson Bley, DOB 1956/05/04, MRN [id number] PCP: Cleotilde Planas, MD  [facility] Providers Cardiologist:  None { Click to update primary MD,subspecialty MD or APP then REFRESH:1}    {Click to Open Review  :1}   Patient Profile:      Jenna [name] is a 68 y.o. female with visit-pertinent history of paroxysmal atrial fibrillation, hyperlipidemia, mild dilation of ascending aorta  She reestablished with cardiology service on 05/27/2023 after a period of lost to follow-up for atrial fibrillation.  She has intolerance to multiple statins including atorvastatin, rosuvastatin , pravastatin and negative experience with Praluent  which resulted in bleeding.  She reported episodes of rapid heartbeat particularly noticeable at night when laying down.  She has history of gastric ulcer which complicates her management particularly in relation to anticoagulation therapy.  She had previously been discontinued off of anticoagulation due to significant gastric ulcer that required a blood transfusion.  Heart monitor ordered 06/17/2023 showing minimum heart rate of 61 bpm, maximum heart rate of 220 bpm (A-fib with RVR), and average heart rate 89 bpm, predominant underlying rhythm was sinus rhythm, atrial fibrillation was paroxysmal and detected with a less than 1% burden, A-fib was symptomatic with RVR lasting 1 hour.  Echocardiogram ordered and completed on 07/06/2023 showing LVEF 65-70%, no RWMA, mild LVH, grade 1 DD, RV SF normal, trivial MR, mild dilation of ascending aorta measuring 38 mm {There is no content from the last Narrative History section.}       History of Present Illness:  Discussed the use of AI scribe software for clinical note transcription with the patient, who gave verbal consent to proceed.  Jenna [name] is a 68 y.o. female who returns for ***Discussed the use of AI scribe software for clinical note transcription with  the patient, who gave verbal consent to proceed.  History of Present Illness            ROS   See HPI ***     Home Medications:    Prior to Admission medications   Medication Sig Start Date End Date Taking? Authorizing Provider  acetaminophen  (TYLENOL ) 500 MG tablet Take 2 tablets (1,000 mg total) by mouth every 6 (six) hours. 08/16/23   Paola Dreama SAILOR, MD  aspirin  EC 81 MG tablet Take 81 mg by mouth daily. Swallow whole.    [provider]  Aspirin -Acetaminophen -Caffeine  (EXCEDRIN  EXTRA STRENGTH PO) Take 2 tablets by mouth daily.    [provider]  BIOTIN PO Take 1 tablet by mouth daily.    [provider]  butalbital-aspirin -caffeine  (FIORINAL) 50-325-40 MG capsule Take 1 capsule by mouth every 6 (six) hours as needed for headache or migraine. 03/29/20   [provider]  denosumab  (PROLIA ) 60 MG/ML SOSY injection Inject 60 mg into the skin every 6 (six) months.    [provider]  docusate sodium  (COLACE) 100 MG capsule Take 1 capsule (100 mg total) by mouth 2 (two) times daily. 08/16/23 08/15/24  Paola Dreama SAILOR, MD  fluticasone  (FLONASE ) 50 MCG/ACT nasal spray Place 2 sprays into the nose 2 (two) times daily.    [provider]  ibuprofen  (ADVIL ) 600 MG tablet Take 1 tablet (600 mg total) by mouth every 6 (six) hours as needed. 08/16/23   Paola Dreama SAILOR, MD  methocarbamol  (ROBAXIN -750) 750 MG tablet Take 1 tablet (750 mg total) by mouth 4 (four) times daily. 08/16/23   Paola Dreama SAILOR,  MD  Multiple Vitamins-Minerals (MULTIVITAL PO) Take 1 tablet by mouth daily.    [provider]  omeprazole  (PRILOSEC) 20 MG capsule Take 20 mg by mouth daily.    [provider]  oxyCODONE  (ROXICODONE ) 5 MG immediate release tablet Take 1 tablet (5 mg total) by mouth every 4 (four) hours as needed for severe pain (pain score 7-10). 08/16/23   Paola Dreama SAILOR, MD  Probiotic Product (PROBIOTIC PO) Take 1 Dose by mouth daily.     [provider]  VITAMIN D-VITAMIN K PO Take 1 tablet by mouth daily.    [provider]   Studies Reviewed:       *** Risk Assessment/Calculations:   {Does this patient have ATRIAL FIBRILLATION?:[phone]} No BP recorded.  {Refresh Note OR Click here to enter BP  :1}***       Physical Exam:   VS:  There were no vitals taken for this visit.   Wt Readings from Last 3 Encounters:  08/15/23 130 lb 15.3 oz (59.4 kg)  05/27/23 131 lb (59.4 kg)  04/21/21 150 lb (68 kg)    Physical Exam***     Assessment and Plan:  Assessment and Plan              {Are you ordering a CV Procedure (e.g. stress test, cath, DCCV, TEE, etc)?   Press F2        :[id number]}  Dispo:  No follow-ups on file.  Signed, Lum LITTIE Louis, NP

## 2023-09-11 DIAGNOSIS — Z8719 Personal history of other diseases of the digestive system: Secondary | ICD-10-CM | POA: Diagnosis not present

## 2023-09-11 DIAGNOSIS — Z9889 Other specified postprocedural states: Secondary | ICD-10-CM | POA: Diagnosis not present

## 2023-09-15 ENCOUNTER — Encounter: Payer: Self-pay | Admitting: Physician Assistant

## 2023-09-15 ENCOUNTER — Ambulatory Visit: Payer: Medicare Other | Attending: Physician Assistant | Admitting: Physician Assistant

## 2023-09-15 VITALS — BP 120/70 | HR 89 | Ht 63.0 in | Wt 132.6 lb

## 2023-09-15 DIAGNOSIS — I4891 Unspecified atrial fibrillation: Secondary | ICD-10-CM | POA: Diagnosis not present

## 2023-09-15 NOTE — Progress Notes (Signed)
 Cardiology Office Note:    Date:  09/15/2023  ID:  Jenna [name], DOB 11/09/1955, MRN [id number] PCP: Sigmund Hazel, MD  [facility] Providers Cardiologist:  Christell Constant, MD {    Patient Profile:      Jenna [name] is a 68 y.o. female with visit-pertinent history of paroxysmal atrial fibrillation, hyperlipidemia, mild dilation of ascending aorta  She reestablished with cardiology service on 05/27/2023 after a period of lost to follow-up for atrial fibrillation.  She has intolerance to multiple statins including atorvastatin, rosuvastatin, pravastatin and negative experience with Praluent which resulted in bleeding.  She reported episodes of rapid heartbeat particularly noticeable at night when laying down.  She has history of gastric ulcer which complicates her management particularly in relation to anticoagulation therapy.  She had previously been discontinued off of anticoagulation due to significant gastric ulcer that required a blood transfusion.  Heart monitor ordered 06/17/2023 showing minimum heart rate of 61 bpm, maximum heart rate of 220 bpm (A-fib with RVR), and average heart rate 89 bpm, predominant underlying rhythm was sinus rhythm, atrial fibrillation was paroxysmal and detected with a less than 1% burden, A-fib was symptomatic with RVR lasting 1 hour.  Echocardiogram ordered and completed on 07/06/2023 showing LVEF 65-70%, no RWMA, mild LVH, grade 1 DD, RV SF normal, trivial MR, mild dilation of ascending aorta measuring 38 mm  Dr. Izora Ribas recommended a trial of blood thinners as previously discussed.  325 mg of aspirin was recommended at that time.  She has a CHA2DS2-VASc score of 2.       History of Present Illness:  Discussed the use of AI scribe software for clinical note transcription with the patient, who gave verbal consent to proceed.  Jenna [name] is a 68 y.o. female who returns for follow-up visit.  Discussed the use of AI  scribe software for clinical note transcription with the patient, who gave verbal consent to proceed.  History of Present Illness        The patient, with a history of atrial fibrillation (AFib) and cholesterol issues, presents for a follow-up visit. The patient reports taking aspirin daily for AFib management and has not noticed any symptoms of AFib recently. The patient underwent emergency surgery a month ago for an incarcerated hernia and has been recovering well since then. The patient reports chronic leg pain, particularly in the left leg, which is sensitive to touch. The patient also mentions a history of stitches in the left leg and a long-standing hematoma. The patient's heart function is described as "hyperdynamic". The patient's cholesterol levels need to be checked, and there is a discussion about possibly starting the patient on Leqvio, a cholesterol-lowering medication.   Reports no shortness of breath nor dyspnea on exertion. Reports no chest pain, pressure, or tightness. No edema, orthopnea, PND. Reports no palpitations.   ROS   See HPI      Home Medications:    Prior to Admission medications   Medication Sig Start Date End Date Taking? Authorizing Provider  acetaminophen (TYLENOL) 500 MG tablet Take 2 tablets (1,000 mg total) by mouth every 6 (six) hours. 08/16/23   Diamantina Monks, MD  aspirin EC 81 MG tablet Take 81 mg by mouth daily. Swallow whole.    [provider]  Aspirin-Acetaminophen-Caffeine (EXCEDRIN EXTRA STRENGTH PO) Take 2 tablets by mouth daily.    [provider]  BIOTIN PO Take 1 tablet by mouth daily.    [provider]  butalbital-aspirin-caffeine (  FIORINAL) 50-325-40 MG capsule Take 1 capsule by mouth every 6 (six) hours as needed for headache or migraine. 03/29/20   [provider]  denosumab (PROLIA) 60 MG/ML SOSY injection Inject 60 mg into the skin every 6 (six) months.    [provider]  docusate sodium  (COLACE) 100 MG capsule Take 1 capsule (100 mg total) by mouth 2 (two) times daily. 08/16/23 08/15/24  Diamantina Monks, MD  fluticasone (FLONASE) 50 MCG/ACT nasal spray Place 2 sprays into the nose 2 (two) times daily.    [provider]  ibuprofen (ADVIL) 600 MG tablet Take 1 tablet (600 mg total) by mouth every 6 (six) hours as needed. 08/16/23   Diamantina Monks, MD  methocarbamol (ROBAXIN-750) 750 MG tablet Take 1 tablet (750 mg total) by mouth 4 (four) times daily. 08/16/23   Diamantina Monks, MD  Multiple Vitamins-Minerals (MULTIVITAL PO) Take 1 tablet by mouth daily.    [provider]  omeprazole (PRILOSEC) 20 MG capsule Take 20 mg by mouth daily.    [provider]  oxyCODONE (ROXICODONE) 5 MG immediate release tablet Take 1 tablet (5 mg total) by mouth every 4 (four) hours as needed for severe pain (pain score 7-10). 08/16/23   Diamantina Monks, MD  Probiotic Product (PROBIOTIC PO) Take 1 Dose by mouth daily.    [provider]  VITAMIN D-VITAMIN K PO Take 1 tablet by mouth daily.    [provider]   Studies Reviewed:       Echo 07/06/23 IMPRESSIONS     1. Left ventricular ejection fraction, by estimation, is 65 to 70%. The  left ventricle has normal function. The left ventricle has no regional  wall motion abnormalities. There is mild asymmetric left ventricular  hypertrophy of the basal-septal segment.  Left ventricular diastolic parameters are consistent with Grade I  diastolic dysfunction (impaired relaxation).   2. Right ventricular systolic function is normal. The right ventricular  size is normal. Tricuspid regurgitation signal is inadequate for assessing  PA pressure.   3. The mitral valve is normal in structure. Trivial mitral valve  regurgitation. No evidence of mitral stenosis.   4. The aortic valve is tricuspid. Aortic valve regurgitation is not  visualized. No aortic stenosis is present.   5. Aortic dilatation noted. There  is mild dilatation of the ascending  aorta, measuring 38 mm.   6. The inferior vena cava is normal in size with greater than 50%  respiratory variability, suggesting right atrial pressure of 3 mmHg.   FINDINGS   Left Ventricle: Left ventricular ejection fraction, by estimation, is 65  to 70%. The left ventricle has normal function. The left ventricle has no  regional wall motion abnormalities. 3D ejection fraction reviewed and  evaluated as part of the  interpretation. Alternate measurement of EF is felt to be most reflective  of LV function. The left ventricular internal cavity size was normal in  size. There is mild asymmetric left ventricular hypertrophy of the  basal-septal segment. Left ventricular  diastolic parameters are consistent with Grade I diastolic dysfunction  (impaired relaxation).   Right Ventricle: The right ventricular size is normal. No increase in  right ventricular wall thickness. Right ventricular systolic function is  normal. Tricuspid regurgitation signal is inadequate for assessing PA  pressure.   Left Atrium: Left atrial size was normal in size.   Right Atrium: Right atrial size was normal in size.   Pericardium: There is no evidence of  pericardial effusion.   Mitral Valve: The mitral valve is normal in structure. Trivial mitral  valve regurgitation. No evidence of mitral valve stenosis.   Tricuspid Valve: The tricuspid valve is normal in structure. Tricuspid  valve regurgitation is trivial.   Aortic Valve: The aortic valve is tricuspid. Aortic valve regurgitation is  not visualized. No aortic stenosis is present.   Pulmonic Valve: The pulmonic valve was not well visualized. Pulmonic valve  regurgitation is not visualized.   Aorta: The aortic root is normal in size and structure and aortic  dilatation noted. There is mild dilatation of the ascending aorta,  measuring 38 mm.   Venous: The inferior vena cava is normal in size with greater than 50%   respiratory variability, suggesting right atrial pressure of 3 mmHg.   IAS/Shunts: The interatrial septum was not well visualized.   Risk Assessment/Calculations:    CHA2DS2-VASc Score = 2   This indicates a 2.2% annual risk of stroke. The patient's score is based upon: CHF History: 0 HTN History: 0 Diabetes History: 0 Stroke History: 0 Vascular Disease History: 0 Age Score: 1 Gender Score: 1             Physical Exam:   VS:  BP 120/70   Pulse 89   Ht 5\' 3"  (1.6 m)   Wt 132 lb 9.6 oz (60.1 kg)   SpO2 97%   BMI 23.49 kg/m    Wt Readings from Last 3 Encounters:  09/15/23 132 lb 9.6 oz (60.1 kg)  08/15/23 130 lb 15.3 oz (59.4 kg)  05/27/23 131 lb (59.4 kg)    Physical Exam    General: Alert, oriented, well-nourished Cardiac: Normal sinus rhythm today, no murmur, S1-S2 Pulmonary: Clear to auscultation bilaterally and in all fields, normal respiratory effort GI: No tenderness, benign Extremities: Warm, well-perfused, no edema Psych: Normal affect  Assessment and Plan:  Assessment and Plan    Atrial Fibrillation Low burden of AFib noted on previous monitor. Currently asymptomatic and in normal sinus rhythm on today's EKG. Patient is on aspirin 325mg  daily for stroke prevention. -Continue aspirin 325mg  daily. -Plan to monitor symptoms and rhythm.  Hyperlipidemia Patient has tried Repatha with poor tolerance. Discussed potential use of Leqvio, pending updated cholesterol labs. -Obtain updated cholesterol labs through primary care provider. -Consider initiation of Leqvio based on lab results.  Mildly Enlarged Ascending Aorta Identified on recent echocardiogram. No high-risk features noted. -Repeat echocardiogram in December 2025 to monitor for changes.  Leg Pain Patient reports chronic leg pain, particularly with touch. No signs of arterial disease or venous insufficiency noted on examination. -Encourage continued water aerobics and regular stretching. -Advise  increased hydration     Dispo: The patient to follow-up with Dr. Izora Ribas in 6 months  Signed, Sharlene Dory, PA-C

## 2023-09-15 NOTE — Patient Instructions (Addendum)
 Medication Instructions:   *If you need a refill on your cardiac medications before your next appointment, please call your pharmacy*   Lab Work: labs with your PCP and have faxed to [place] [phone]  If you have labs (blood work) drawn today and your tests are completely normal, you will receive your results only by: MyChart Message (if you have MyChart) OR A paper copy in the mail If you have any lab test that is abnormal or we need to change your treatment, we will call you to review the results.   Testing/Procedures:    Follow-Up: 6 months with Dr Izora Ribas  At [facility], you and your health needs are our priority.  As part of our continuing mission to provide you with exceptional heart care, we have created designated Provider Care Teams.  These Care Teams include your primary Cardiologist (physician) and Advanced Practice Providers (APPs -  Physician Assistants and Nurse Practitioners) who all work together to provide you with the care you need, when you need it.  We recommend signing up for the patient portal called "MyChart".  Sign up information is provided on this After Visit Summary.  MyChart is used to connect with patients for Virtual Visits (Telemedicine).  Patients are able to view lab/test results, encounter notes, upcoming appointments, etc.  Non-urgent messages can be sent to your provider as well.   To learn more about what you can do with MyChart, go to [email or web address].    Your next appointment:       Inclisiran Injection What is this medication? INCLISIRAN (in kli SIR an) treats high cholesterol. It works by decreasing bad cholesterol (such as LDL) in your blood. Changes to diet and exercise are often combined with this medication. This medicine may be used for other purposes; ask your health care provider or pharmacist if you have questions. COMMON BRAND NAME(S): LEQVIO What should I tell my care team before I take this medication? They  need to know if you have any of these conditions: An unusual or allergic reaction to inclisiran, other medications, foods, dyes, or preservatives Pregnant or trying to get pregnant Breast-feeding How should I use this medication? This medication is injected under the skin. It is given by your care team in a hospital or clinic setting. Talk to your care team about the use of this medication in children. Special care may be needed. Overdosage: If you think you have taken too much of this medicine contact a poison control center or emergency room at once. NOTE: This medicine is only for you. Do not share this medicine with others. What if I miss a dose? Keep appointments for follow-up doses. It is important not to miss your dose. Call your care team if you are unable to keep an appointment. What may interact with this medication? Interactions are not expected. This list may not describe all possible interactions. Give your health care provider a list of all the medicines, herbs, non-prescription drugs, or dietary supplements you use. Also tell them if you smoke, drink alcohol, or use illegal drugs. Some items may interact with your medicine. What should I watch for while using this medication? Visit your care team for regular checks on your progress. Tell your care team if your symptoms do not start to get better or if they get worse. You may need blood work while you are taking this medication. What side effects may I notice from receiving this medication? Side effects that you should report to your care  team as soon as possible: Allergic reactions--skin rash, itching, hives, swelling of the face, lips, tongue, or throat Side effects that usually do not require medical attention (report these to your care team if they continue or are bothersome): Joint pain Pain, redness, or irritation at injection site This list may not describe all possible side effects. Call your doctor for medical advice  about side effects. You may report side effects to FDA at 1-800-FDA-1088. Where should I keep my medication? This medication is given in a hospital or clinic. It will not be stored at home. NOTE: This sheet is a summary. It may not cover all possible information. If you have questions about this medicine, talk to your doctor, pharmacist, or health care provider.  2024 Elsevier/Gold Standard (2023-06-19 00:00:00)

## 2023-09-17 ENCOUNTER — Telehealth: Payer: Self-pay | Admitting: Pharmacist

## 2023-09-17 NOTE — Telephone Encounter (Signed)
-----   Message from [place] sent at 09/16/2023  3:18 PM EST ----- Regarding: Odella Aquas / Layla Barter,  I will begin the BIV and the auth process.  We currently do not have a treatment plan entered.  Please enter the treatment so I can begin the process.  Thanks Kim ----- Message ----- From: Willette Cluster, CPhT Sent: 09/16/2023   3:06 PM EST To: Amalia Greenhouse, CPhT  She has requested starting Leqvio and so I would like to get the PA authorization process started. ----- Message ----- From: Bunnie Domino Sent: 09/15/2023   5:27 PM EST To: Rx Prior Auth Team  This patient has tried Repatha with poor tolerance.  Also poor tolerance with statins.  She has requested starting Leqvio and so I would like to get the PA authorization process started.  Thank you!

## 2023-09-22 ENCOUNTER — Telehealth: Payer: Self-pay

## 2023-09-22 NOTE — Telephone Encounter (Signed)
 Tessa, patient will be scheduled as soon as possible.  Auth Submission: NO AUTH NEEDED Site of care: Site of care: [facility] Payer: [employer] medicare Medication & CPT/J Code(s) submitted: Leqvio (Inclisiran) [phone] Route of submission (phone, fax, portal): phone Phone # [phone] Fax # Auth type: Buy/Bill PB Units/visits requested: 284mg  x 2 doses Reference number: [id number] Approval from: 09/22/23 to 07/20/24   I submitted a prior auth to fax # [phone] and received a fax back stating that Wilber Bihari is not covered under the Part D (Pharmacy) benefit but never received a fax about the Part B (medical) benefit. I called [employer] and they confirmed that it was because this J-code does not need a prior authorization with the medical benefit.

## 2023-10-08 DIAGNOSIS — H1013 Acute atopic conjunctivitis, bilateral: Secondary | ICD-10-CM | POA: Diagnosis not present

## 2023-10-12 ENCOUNTER — Telehealth: Payer: Self-pay | Admitting: Pharmacy Technician

## 2023-10-12 NOTE — Telephone Encounter (Signed)
 Patient has been approved for the [employer]. 09/12/23 - 09/10/24 $ 2500

## 2023-10-21 DIAGNOSIS — M170 Bilateral primary osteoarthritis of knee: Secondary | ICD-10-CM | POA: Diagnosis not present

## 2023-10-28 DIAGNOSIS — K219 Gastro-esophageal reflux disease without esophagitis: Secondary | ICD-10-CM | POA: Diagnosis not present

## 2023-10-28 DIAGNOSIS — I480 Paroxysmal atrial fibrillation: Secondary | ICD-10-CM | POA: Diagnosis not present

## 2023-10-28 DIAGNOSIS — E782 Mixed hyperlipidemia: Secondary | ICD-10-CM | POA: Diagnosis not present

## 2023-10-28 DIAGNOSIS — Z1331 Encounter for screening for depression: Secondary | ICD-10-CM | POA: Diagnosis not present

## 2023-10-28 DIAGNOSIS — Z0000 Encounter for general adult medical examination without abnormal findings: Secondary | ICD-10-CM | POA: Diagnosis not present

## 2023-10-28 DIAGNOSIS — M810 Age-related osteoporosis without current pathological fracture: Secondary | ICD-10-CM | POA: Diagnosis not present

## 2023-10-29 ENCOUNTER — Other Ambulatory Visit: Payer: Self-pay | Admitting: Family Medicine

## 2023-10-29 DIAGNOSIS — M810 Age-related osteoporosis without current pathological fracture: Secondary | ICD-10-CM

## 2023-11-26 DIAGNOSIS — H5213 Myopia, bilateral: Secondary | ICD-10-CM | POA: Diagnosis not present

## 2023-11-30 DIAGNOSIS — M810 Age-related osteoporosis without current pathological fracture: Secondary | ICD-10-CM | POA: Diagnosis not present

## 2023-12-03 DIAGNOSIS — R42 Dizziness and giddiness: Secondary | ICD-10-CM | POA: Diagnosis not present

## 2023-12-03 DIAGNOSIS — H9202 Otalgia, left ear: Secondary | ICD-10-CM | POA: Diagnosis not present

## 2023-12-11 DIAGNOSIS — J019 Acute sinusitis, unspecified: Secondary | ICD-10-CM | POA: Diagnosis not present

## 2023-12-22 DIAGNOSIS — K080 Exfoliation of teeth due to systemic causes: Secondary | ICD-10-CM | POA: Diagnosis not present

## 2024-01-15 DIAGNOSIS — M170 Bilateral primary osteoarthritis of knee: Secondary | ICD-10-CM | POA: Diagnosis not present

## 2024-01-21 DIAGNOSIS — M21611 Bunion of right foot: Secondary | ICD-10-CM | POA: Diagnosis not present

## 2024-01-21 DIAGNOSIS — M21612 Bunion of left foot: Secondary | ICD-10-CM | POA: Diagnosis not present

## 2024-03-04 DIAGNOSIS — J0110 Acute frontal sinusitis, unspecified: Secondary | ICD-10-CM | POA: Diagnosis not present

## 2024-03-04 DIAGNOSIS — Z6823 Body mass index (BMI) 23.0-23.9, adult: Secondary | ICD-10-CM | POA: Diagnosis not present

## 2024-03-22 DIAGNOSIS — M170 Bilateral primary osteoarthritis of knee: Secondary | ICD-10-CM | POA: Diagnosis not present

## 2024-03-22 DIAGNOSIS — M25562 Pain in left knee: Secondary | ICD-10-CM | POA: Diagnosis not present

## 2024-03-22 DIAGNOSIS — M25561 Pain in right knee: Secondary | ICD-10-CM | POA: Diagnosis not present

## 2024-03-23 DIAGNOSIS — H26492 Other secondary cataract, left eye: Secondary | ICD-10-CM | POA: Diagnosis not present

## 2024-03-30 DIAGNOSIS — J069 Acute upper respiratory infection, unspecified: Secondary | ICD-10-CM | POA: Diagnosis not present

## 2024-04-01 DIAGNOSIS — Z961 Presence of intraocular lens: Secondary | ICD-10-CM | POA: Diagnosis not present

## 2024-04-01 DIAGNOSIS — H0288A Meibomian gland dysfunction right eye, upper and lower eyelids: Secondary | ICD-10-CM | POA: Diagnosis not present

## 2024-04-01 DIAGNOSIS — H26492 Other secondary cataract, left eye: Secondary | ICD-10-CM | POA: Diagnosis not present

## 2024-04-01 DIAGNOSIS — H04123 Dry eye syndrome of bilateral lacrimal glands: Secondary | ICD-10-CM | POA: Diagnosis not present

## 2024-04-06 DIAGNOSIS — M170 Bilateral primary osteoarthritis of knee: Secondary | ICD-10-CM | POA: Diagnosis not present

## 2024-06-02 DIAGNOSIS — M810 Age-related osteoporosis without current pathological fracture: Secondary | ICD-10-CM | POA: Diagnosis not present

## 2024-07-01 DIAGNOSIS — M170 Bilateral primary osteoarthritis of knee: Secondary | ICD-10-CM | POA: Diagnosis not present

## 2024-07-06 DIAGNOSIS — K080 Exfoliation of teeth due to systemic causes: Secondary | ICD-10-CM | POA: Diagnosis not present

## 2024-07-22 ENCOUNTER — Other Ambulatory Visit: Payer: Self-pay | Admitting: Family Medicine

## 2024-07-22 ENCOUNTER — Encounter: Payer: Self-pay | Admitting: Oncology

## 2024-07-22 ENCOUNTER — Encounter: Payer: Self-pay | Admitting: Physician Assistant

## 2024-07-22 DIAGNOSIS — Z1231 Encounter for screening mammogram for malignant neoplasm of breast: Secondary | ICD-10-CM

## 2024-08-04 ENCOUNTER — Encounter: Payer: Self-pay | Admitting: Oncology

## 2024-08-04 ENCOUNTER — Ambulatory Visit
Admission: RE | Admit: 2024-08-04 | Discharge: 2024-08-04 | Disposition: A | Source: Ambulatory Visit | Attending: Family Medicine | Admitting: Family Medicine

## 2024-08-04 ENCOUNTER — Encounter: Payer: Self-pay | Admitting: Physician Assistant

## 2024-08-04 DIAGNOSIS — Z1231 Encounter for screening mammogram for malignant neoplasm of breast: Secondary | ICD-10-CM

## 2024-09-02 ENCOUNTER — Ambulatory Visit

## 2024-09-06 ENCOUNTER — Ambulatory Visit: Admitting: Physical Therapy

## 2024-09-09 ENCOUNTER — Ambulatory Visit: Admitting: Physical Therapy

## 2024-09-13 ENCOUNTER — Ambulatory Visit: Admitting: Physical Therapy

## 2024-09-16 ENCOUNTER — Ambulatory Visit: Admitting: Physical Therapy

## 2024-09-20 ENCOUNTER — Ambulatory Visit: Admitting: Physical Therapy

## 2024-09-23 ENCOUNTER — Ambulatory Visit: Admitting: Physical Therapy

## 2024-09-27 ENCOUNTER — Ambulatory Visit: Admitting: Physical Therapy

## 2024-09-30 ENCOUNTER — Ambulatory Visit: Admitting: Physical Therapy
